# Patient Record
Sex: Female | Born: 1975 | Race: White | Hispanic: No | Marital: Married | Smoking: Former smoker
Health system: Southern US, Community
[De-identification: ages and names within clinical notes are randomized; demographics above are authoritative.]

## PROBLEM LIST (undated history)

## (undated) DIAGNOSIS — M51369 Other intervertebral disc degeneration, lumbar region without mention of lumbar back pain or lower extremity pain: Secondary | ICD-10-CM

## (undated) DIAGNOSIS — R569 Unspecified convulsions: Secondary | ICD-10-CM

## (undated) DIAGNOSIS — F419 Anxiety disorder, unspecified: Secondary | ICD-10-CM

## (undated) DIAGNOSIS — K5792 Diverticulitis of intestine, part unspecified, without perforation or abscess without bleeding: Secondary | ICD-10-CM

## (undated) DIAGNOSIS — M1990 Unspecified osteoarthritis, unspecified site: Secondary | ICD-10-CM

## (undated) DIAGNOSIS — K5790 Diverticulosis of intestine, part unspecified, without perforation or abscess without bleeding: Secondary | ICD-10-CM

## (undated) DIAGNOSIS — R011 Cardiac murmur, unspecified: Secondary | ICD-10-CM

## (undated) DIAGNOSIS — L4050 Arthropathic psoriasis, unspecified: Secondary | ICD-10-CM

## (undated) DIAGNOSIS — M5126 Other intervertebral disc displacement, lumbar region: Secondary | ICD-10-CM

## (undated) DIAGNOSIS — T883XXA Malignant hyperthermia due to anesthesia, initial encounter: Secondary | ICD-10-CM

## (undated) DIAGNOSIS — Z8719 Personal history of other diseases of the digestive system: Secondary | ICD-10-CM

## (undated) DIAGNOSIS — L409 Psoriasis, unspecified: Secondary | ICD-10-CM

## (undated) DIAGNOSIS — N83209 Unspecified ovarian cyst, unspecified side: Secondary | ICD-10-CM

## (undated) DIAGNOSIS — R519 Headache, unspecified: Secondary | ICD-10-CM

## (undated) DIAGNOSIS — E785 Hyperlipidemia, unspecified: Secondary | ICD-10-CM

## (undated) DIAGNOSIS — E079 Disorder of thyroid, unspecified: Secondary | ICD-10-CM

## (undated) DIAGNOSIS — E220 Acromegaly and pituitary gigantism: Secondary | ICD-10-CM

## (undated) DIAGNOSIS — E8801 Alpha-1-antitrypsin deficiency: Secondary | ICD-10-CM

## (undated) DIAGNOSIS — Z8489 Family history of other specified conditions: Secondary | ICD-10-CM

## (undated) DIAGNOSIS — D219 Benign neoplasm of connective and other soft tissue, unspecified: Secondary | ICD-10-CM

## (undated) DIAGNOSIS — E039 Hypothyroidism, unspecified: Secondary | ICD-10-CM

## (undated) DIAGNOSIS — F909 Attention-deficit hyperactivity disorder, unspecified type: Secondary | ICD-10-CM

## (undated) DIAGNOSIS — K219 Gastro-esophageal reflux disease without esophagitis: Secondary | ICD-10-CM

## (undated) DIAGNOSIS — M5136 Other intervertebral disc degeneration, lumbar region: Secondary | ICD-10-CM

## (undated) DIAGNOSIS — J45909 Unspecified asthma, uncomplicated: Secondary | ICD-10-CM

## (undated) DIAGNOSIS — I951 Orthostatic hypotension: Secondary | ICD-10-CM

## (undated) HISTORY — DX: Unspecified convulsions: R56.9

## (undated) HISTORY — DX: Anxiety disorder, unspecified: F41.9

## (undated) HISTORY — DX: Malignant hyperthermia due to anesthesia, initial encounter: T88.3XXA

## (undated) HISTORY — DX: Acromegaly and pituitary gigantism: E22.0

## (undated) HISTORY — DX: Benign neoplasm of connective and other soft tissue, unspecified: D21.9

## (undated) HISTORY — DX: Diverticulitis of intestine, part unspecified, without perforation or abscess without bleeding: K57.92

## (undated) HISTORY — DX: Diverticulosis of intestine, part unspecified, without perforation or abscess without bleeding: K57.90

## (undated) HISTORY — DX: Hypothyroidism, unspecified: E03.9

## (undated) HISTORY — DX: Hyperlipidemia, unspecified: E78.5

## (undated) HISTORY — DX: Other intervertebral disc displacement, lumbar region: M51.26

## (undated) HISTORY — DX: Unspecified osteoarthritis, unspecified site: M19.90

## (undated) HISTORY — DX: Unspecified ovarian cyst, unspecified side: N83.209

## (undated) HISTORY — DX: Gastro-esophageal reflux disease without esophagitis: K21.9

## (undated) HISTORY — DX: Disorder of thyroid, unspecified: E07.9

## (undated) HISTORY — PX: COLONOSCOPY: SHX174

## (undated) HISTORY — PX: DENTAL SURGERY: SHX609

## (undated) HISTORY — DX: Other intervertebral disc degeneration, lumbar region without mention of lumbar back pain or lower extremity pain: M51.369

## (undated) HISTORY — DX: Arthropathic psoriasis, unspecified: L40.50

## (undated) HISTORY — DX: Alpha-1-antitrypsin deficiency: E88.01

## (undated) HISTORY — DX: Psoriasis, unspecified: L40.9

## (undated) HISTORY — DX: Other intervertebral disc degeneration, lumbar region: M51.36

---

## 2011-04-01 HISTORY — PX: LAPAROSCOPIC SIGMOID COLECTOMY: SHX5928

## 2011-06-30 LAB — HM COLONOSCOPY

## 2012-03-31 LAB — HM PAP SMEAR: HM Pap smear: NORMAL

## 2012-10-29 LAB — HM MAMMOGRAPHY: HM MAMMO: NORMAL (ref 0–4)

## 2016-11-26 ENCOUNTER — Encounter: Payer: Self-pay | Admitting: Primary Care

## 2016-11-26 ENCOUNTER — Ambulatory Visit (INDEPENDENT_AMBULATORY_CARE_PROVIDER_SITE_OTHER): Payer: 59 | Admitting: Primary Care

## 2016-11-26 VITALS — BP 118/78 | HR 99 | Temp 98.4°F | Ht 70.0 in | Wt 195.4 lb

## 2016-11-26 DIAGNOSIS — K219 Gastro-esophageal reflux disease without esophagitis: Secondary | ICD-10-CM | POA: Diagnosis not present

## 2016-11-26 DIAGNOSIS — E039 Hypothyroidism, unspecified: Secondary | ICD-10-CM | POA: Insufficient documentation

## 2016-11-26 DIAGNOSIS — E559 Vitamin D deficiency, unspecified: Secondary | ICD-10-CM

## 2016-11-26 DIAGNOSIS — E8801 Alpha-1-antitrypsin deficiency: Secondary | ICD-10-CM | POA: Diagnosis not present

## 2016-11-26 DIAGNOSIS — E538 Deficiency of other specified B group vitamins: Secondary | ICD-10-CM | POA: Diagnosis not present

## 2016-11-26 DIAGNOSIS — E785 Hyperlipidemia, unspecified: Secondary | ICD-10-CM | POA: Diagnosis not present

## 2016-11-26 DIAGNOSIS — L409 Psoriasis, unspecified: Secondary | ICD-10-CM | POA: Insufficient documentation

## 2016-11-26 MED ORDER — MOMETASONE FUROATE 0.1 % EX CREA: 1.0000 "application " | TOPICAL_CREAM | Freq: Every day | CUTANEOUS | 1 refills | Status: DC

## 2016-11-26 NOTE — Patient Instructions (Signed)
Schedule a lab only appointment to return fasting for labs. Ensure you fast 8 hours prior. You may have water.  Please schedule a physical with me at your convenience, we can do the Pap and get your mammogram set up at that time.  It was a pleasure to meet you today! Please don't hesitate to call me with any questions. Welcome to [facility]!

## 2016-11-26 NOTE — Assessment & Plan Note (Signed)
Vitamin D level pending

## 2016-11-26 NOTE — Progress Notes (Signed)
Subjective:    Patient ID: Elaine [name], female    DOB: 1975-07-18, 41 y.o.   MRN: [id number]  HPI  Elaine [name] is a 41 year old female who presents today to establish care and discuss the problems mentioned below. Will obtain old records.  1) Hyperlipidemia: Long history of. Strong family history of this. Never managed on medication in the past. Her last lipid panel was over 1 year ago. She is not fasting today.  2) Alpha anti-1 Trypsin deficiency: Diagnosed several years ago. She's not currently following with anyone at this time. Previously following with GI, pulmonology, Urology in [place], she has not connected with anyone since moving to [place]. She experiences symptoms of shortness of breath, overall manageable. History of recurrent pneumonia in the past. History of colectomy in 2013 due to abdominal sepsis.  3) Hypothyroidism: Diagnosed in 2016. She was originally evaluated by ENT who found a thyroid nodule. The nodule was biopsied which returned benign. Never managed on thyroid replacement medication, always treated with natural supplements. Over the past several months she's gained 20 pounds, experienced increased fatigue, inability to sleep, cold intolerance with bouts of hot flashes, hair loss. Her last thyroid check was over 1 year ago.  4) GERD: Long history of since age 60. Prior endoscopy was in 2012. She will take Prilosec as needed for symptoms, about 2-3 times monthly. She's mostly trying digestive bitters. She watches her diet. No improvement with H2 blockers.  5) Psoriasis: Located to her scalp mostly, right upper extremity, between breasts, eyelids, buttocks, left lateral side. Always suspected to be secondary to alpha anti-1 trypsin. She was previously managed on mometasone with significant improvement.   Review of Systems  Constitutional: Positive for fatigue.  HENT: Negative for congestion.   Respiratory:       Intermittent shortness of breath  Cardiovascular:  Negative for chest pain.  Gastrointestinal:       GERD  Endocrine: Positive for cold intolerance.  Genitourinary:       Menorrhagia  Skin:       Chronic psoriasis  Hematological: Negative for adenopathy.  Psychiatric/Behavioral: Positive for sleep disturbance.        Past Medical History:  Diagnosis Date  . Alpha-1-antitrypsin deficiency ([place])   . Diverticulosis   . GERD (gastroesophageal reflux disease)   . 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 Hyperlipidemia   . Hypothyroidism   . Psoriasis   . Ruptured ovarian cyst 2004,2016     Social History   Social History  . Marital status: Married    Spouse name: N/A  . Number of children: N/A  . Years of education: N/A   Occupational History  . Not on file.   Social History Main Topics  . Smoking status: Former Research scientist (life sciences)  . Smokeless tobacco: Never Used  . Alcohol use Yes  . Drug use: Unknown  . Sexual activity: Not on file   Other Topics Concern  . Not on file   Social History Narrative   Married.   1 child.   Works as a Psychologist, sport and exercise.        Past Surgical History:  Procedure Laterality Date  . COLECTOMY  2013  . DENTAL SURGERY      Family History  Problem Relation Age of Onset  . Stroke Father   . Heart attack Father   . Hyperlipidemia Father     Allergies  Allergen Reactions  . Cephalexin   . Dilaudid  [  Hydromorphone Hcl] Nausea And Vomiting  . Penicillins     No current outpatient prescriptions on file prior to visit.   No current facility-administered medications on file prior to visit.     BP 118/78   Pulse 99   Temp 98.4 F (36.9 C) (Oral)   Ht 5\' 10"  (1.778 m)   Wt 195 lb 6.4 oz (88.6 kg)   LMP 11/12/2016   SpO2 99%   BMI 28.04 kg/m    Objective:   Physical Exam  Constitutional: She is oriented to person, place, and time. She appears well-nourished.  Neck: Neck supple. No thyromegaly present.  Cardiovascular: Normal rate and regular rhythm.   Pulmonary/Chest:  Effort normal and breath sounds normal.  Musculoskeletal: Normal range of motion.  Neurological: She is alert and oriented to person, place, and time.  Skin: Skin is warm and dry.  Psychiatric: She has a normal mood and affect.          Assessment & Plan:

## 2016-11-26 NOTE — Assessment & Plan Note (Signed)
Overall endorses well controlled. Consider sending to pulmonology for further management if symptoms return.

## 2016-11-26 NOTE — Assessment & Plan Note (Signed)
Prescription for mometasone cream sent to pharmacy as this has historically help with symptoms.

## 2016-11-26 NOTE — Assessment & Plan Note (Signed)
Lipid panel pending.

## 2016-11-26 NOTE — Assessment & Plan Note (Signed)
No recent thyroid testing, thyroid panel pending.

## 2016-11-26 NOTE — Assessment & Plan Note (Signed)
Vitamin B12 pending.

## 2016-11-26 NOTE — Assessment & Plan Note (Signed)
Overall stable on Prilosec, using sparingly. Discussed long-term effects of PPIs.

## 2016-11-27 ENCOUNTER — Encounter: Payer: Self-pay | Admitting: Primary Care

## 2016-11-27 ENCOUNTER — Other Ambulatory Visit (INDEPENDENT_AMBULATORY_CARE_PROVIDER_SITE_OTHER): Payer: 59

## 2016-11-27 DIAGNOSIS — E538 Deficiency of other specified B group vitamins: Secondary | ICD-10-CM | POA: Diagnosis not present

## 2016-11-27 DIAGNOSIS — E785 Hyperlipidemia, unspecified: Secondary | ICD-10-CM | POA: Diagnosis not present

## 2016-11-27 DIAGNOSIS — E559 Vitamin D deficiency, unspecified: Secondary | ICD-10-CM | POA: Diagnosis not present

## 2016-11-27 DIAGNOSIS — E039 Hypothyroidism, unspecified: Secondary | ICD-10-CM | POA: Diagnosis not present

## 2016-11-27 LAB — LIPID PANEL
CHOL/HDL RATIO: 4
CHOLESTEROL: 205 mg/dL — AB (ref 0–200)
HDL: 48.6 mg/dL (ref >39.00)
LDL CALC: 131 mg/dL — AB (ref 0–99)
NonHDL: 156.85
TRIGLYCERIDES: 128 mg/dL (ref 0.0–149.0)
VLDL: 25.6 mg/dL (ref 0.0–40.0)

## 2016-11-27 LAB — COMPREHENSIVE METABOLIC PANEL
ALT: 9 U/L (ref 0–35)
AST: 12 U/L (ref 0–37)
Albumin: 4.3 g/dL (ref 3.5–5.2)
Alkaline Phosphatase: 53 U/L (ref 39–117)
BILIRUBIN TOTAL: 0.6 mg/dL (ref 0.2–1.2)
BUN: 14 mg/dL (ref 6–23)
CHLORIDE: 106 meq/L (ref 96–112)
CO2: 27 meq/L (ref 19–32)
Calcium: 9.4 mg/dL (ref 8.4–10.5)
Creatinine, Ser: 0.79 mg/dL (ref 0.40–1.20)
GFR: 85.62 mL/min (ref >60.00)
GLUCOSE: 97 mg/dL (ref 70–99)
Potassium: 4.3 mEq/L (ref 3.5–5.1)
Sodium: 139 mEq/L (ref 135–145)
Total Protein: 6.5 g/dL (ref 6.0–8.3)

## 2016-11-27 LAB — TSH: TSH: 1.96 u[IU]/mL (ref 0.35–4.50)

## 2016-11-27 LAB — VITAMIN D 25 HYDROXY (VIT D DEFICIENCY, FRACTURES): VITD: 57.46 ng/mL (ref 30.00–100.00)

## 2016-11-27 LAB — T3, FREE: T3, Free: 3.5 pg/mL (ref 2.3–4.2)

## 2016-11-27 LAB — T4, FREE: Free T4: 0.71 ng/dL (ref 0.60–1.60)

## 2016-11-27 LAB — VITAMIN B12: Vitamin B-12: 1251 pg/mL — ABNORMAL HIGH (ref 211–911)

## 2016-11-27 NOTE — Addendum Note (Signed)
Addended by: Jacqualin Combes on: 11/27/2016 02:46 PM   Modules accepted: Orders

## 2016-11-28 ENCOUNTER — Telehealth: Payer: Self-pay | Admitting: Primary Care

## 2016-11-28 NOTE — Telephone Encounter (Signed)
Pt returned your call, requesting cb.

## 2016-12-08 ENCOUNTER — Encounter: Payer: 59 | Admitting: Primary Care

## 2016-12-24 ENCOUNTER — Other Ambulatory Visit (HOSPITAL_COMMUNITY)
Admission: RE | Admit: 2016-12-24 | Discharge: 2016-12-24 | Disposition: A | Discharge status: Home / Self Care | Payer: 59 | Source: Ambulatory Visit | Attending: Primary Care | Admitting: Primary Care

## 2016-12-24 ENCOUNTER — Encounter: Payer: Self-pay | Admitting: Primary Care

## 2016-12-24 ENCOUNTER — Ambulatory Visit (INDEPENDENT_AMBULATORY_CARE_PROVIDER_SITE_OTHER): Payer: 59 | Admitting: Primary Care

## 2016-12-24 VITALS — BP 108/78 | HR 76 | Temp 98.4°F | Ht 70.0 in | Wt 188.8 lb

## 2016-12-24 DIAGNOSIS — Z124 Encounter for screening for malignant neoplasm of cervix: Secondary | ICD-10-CM | POA: Diagnosis not present

## 2016-12-24 DIAGNOSIS — Z1231 Encounter for screening mammogram for malignant neoplasm of breast: Secondary | ICD-10-CM | POA: Diagnosis not present

## 2016-12-24 DIAGNOSIS — K5790 Diverticulosis of intestine, part unspecified, without perforation or abscess without bleeding: Secondary | ICD-10-CM | POA: Insufficient documentation

## 2016-12-24 DIAGNOSIS — E538 Deficiency of other specified B group vitamins: Secondary | ICD-10-CM

## 2016-12-24 DIAGNOSIS — Z87891 Personal history of nicotine dependence: Secondary | ICD-10-CM | POA: Insufficient documentation

## 2016-12-24 DIAGNOSIS — L409 Psoriasis, unspecified: Secondary | ICD-10-CM | POA: Diagnosis not present

## 2016-12-24 DIAGNOSIS — Z0000 Encounter for general adult medical examination without abnormal findings: Secondary | ICD-10-CM

## 2016-12-24 DIAGNOSIS — E785 Hyperlipidemia, unspecified: Secondary | ICD-10-CM

## 2016-12-24 DIAGNOSIS — Z0001 Encounter for general adult medical examination with abnormal findings: Secondary | ICD-10-CM

## 2016-12-24 DIAGNOSIS — E559 Vitamin D deficiency, unspecified: Secondary | ICD-10-CM | POA: Diagnosis not present

## 2016-12-24 DIAGNOSIS — E039 Hypothyroidism, unspecified: Secondary | ICD-10-CM

## 2016-12-24 DIAGNOSIS — K219 Gastro-esophageal reflux disease without esophagitis: Secondary | ICD-10-CM | POA: Insufficient documentation

## 2016-12-24 DIAGNOSIS — Z1239 Encounter for other screening for malignant neoplasm of breast: Secondary | ICD-10-CM

## 2016-12-24 DIAGNOSIS — L237 Allergic contact dermatitis due to plants, except food: Secondary | ICD-10-CM

## 2016-12-24 DIAGNOSIS — R569 Unspecified convulsions: Secondary | ICD-10-CM | POA: Insufficient documentation

## 2016-12-24 DIAGNOSIS — Z1211 Encounter for screening for malignant neoplasm of colon: Secondary | ICD-10-CM | POA: Insufficient documentation

## 2016-12-24 MED ORDER — TRIAMCINOLONE ACETONIDE 0.1 % EX CREA: 1.0000 "application " | TOPICAL_CREAM | Freq: Two times a day (BID) | CUTANEOUS | 0 refills | Status: DC

## 2016-12-24 NOTE — Progress Notes (Signed)
Subjective:    Patient ID: Elaine [name], female    DOB: 1975-11-26, 41 y.o.   MRN: [id number]  HPI  Elaine [name] is a 41 year old female who presents today for complete physical.  Immunizations: -Tetanus: Completed in 2015 -Influenza: Completed in late August 2018   Diet: She endorses a healthy diet.  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 Eye exam: Completed in November 2017 Dental exam: Completes annually Pap Smear: Completed in 2014.  Mammogram: Completed in 2014, due.    Review of Systems  Constitutional: Negative for unexpected weight change.  HENT: Negative for rhinorrhea.   Respiratory: Negative for cough and shortness of breath.   Cardiovascular: Negative for chest pain.  Gastrointestinal: Negative for constipation and diarrhea.  Genitourinary: Positive for menstrual problem. Negative for difficulty urinating.       Menstrual cycles more frequent, twice monthly with a 2 day cessation of menses. Present for the past 2 months.   Musculoskeletal: Negative for arthralgias and myalgias.  Skin: Positive for rash.       Encountered poison ivy  Allergic/Immunologic: Negative for environmental allergies.  Neurological: Negative for dizziness, numbness and headaches.  Psychiatric/Behavioral:       Increased stress, manages well.        Past Medical History:  Diagnosis Date  . Alpha-1-antitrypsin deficiency ([place])   . Diverticulitis    with sepsis  . Diverticulosis   . GERD (gastroesophageal reflux disease)   . 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 Hyperlipidemia   . Hypothyroidism   . Psoriasis   . Ruptured ovarian cyst 2004,2016  . Seizures [facility])      Social History   Social History  . Marital status: Married    Spouse name: N/A  . Number of children: N/A  .  Years of education: N/A   Occupational History  . Not on file.   Social History Main Topics  . Smoking status: Former Research scientist (life sciences)  . Smokeless tobacco: Never Used  . Alcohol use Yes  . Drug use: Unknown  . Sexual activity: Not on file   Other Topics Concern  . Not on file   Social History Narrative   Married.   1 child.   Works as a Psychologist, sport and exercise.        Past Surgical History:  Procedure Laterality Date  . DENTAL SURGERY    . LAPAROSCOPIC SIGMOID COLECTOMY  2013    Family History  Problem Relation Age of Onset  . Stroke Father   . Heart attack Father   . Hyperlipidemia Father     Allergies  Allergen Reactions  . Cephalexin   . Dilaudid  [Hydromorphone Hcl] Nausea And Vomiting  . Penicillins     Current Outpatient Prescriptions on File Prior to Visit  Medication Sig Dispense Refill  . 5-Hydroxytryptophan (5-HTP PO) Take 200 mg by mouth daily.    . INOSITOL PO Take 500 mg by mouth daily.    Marland Kitchen KRILL OIL PO Take 1,000 mg by mouth daily.    Marland Kitchen MAGNESIUM MALATE PO Take 625 mg by mouth daily.    . Melatonin 5 MG CAPS Take 5 mg by mouth at bedtime.    . mometasone (ELOCON) 0.1 % cream Apply 1 application topically daily. 45 g 1   No current facility-administered medications on  file prior to visit.     BP 108/78   Pulse 76   Temp 98.4 F (36.9 C) (Oral)   Ht 5\' 10"  (1.778 m)   Wt 188 lb 12.8 oz (85.6 kg)   LMP 12/06/2016   SpO2 98%   BMI 27.09 kg/m    Objective:   Physical Exam  Constitutional: She is oriented to person, place, and time. She appears well-nourished.  HENT:  Right Ear: Tympanic membrane and ear canal normal.  Left Ear: Tympanic membrane and ear canal normal.  Nose: Nose normal.  Mouth/Throat: Oropharynx is clear and moist.  Eyes: Pupils are equal, round, and reactive to light. Conjunctivae and EOM are normal.  Neck: Neck supple. No thyromegaly present.  Cardiovascular: Normal rate and regular rhythm.   No murmur heard. Pulmonary/Chest: Effort  normal and breath sounds normal. She has no rales.  Abdominal: Soft. Bowel sounds are normal. There is no tenderness.  Musculoskeletal: Normal range of motion.  Lymphadenopathy:    She has no cervical adenopathy.  Neurological: She is alert and oriented to person, place, and time. She has normal reflexes. No cranial nerve deficit.  Skin: Skin is warm and dry. Rash noted.  Mild rash to bilateral upper extremities representative of contact dermatitis from poison ivy  Psychiatric: She has a normal mood and affect.          Assessment & Plan:

## 2016-12-24 NOTE — Assessment & Plan Note (Signed)
Immunizations UTD. Pap due, completed today. Mammogram due, ordered today. Continue regular exercise and improvements in diet. Exam unremarkable except for poison ivy dermatitis. Treated with triamcinolone cream BID. Labs overall stable. Repeat Vitamin D and B 12 in 3 months. Follow up in 1 year.

## 2016-12-24 NOTE — Assessment & Plan Note (Signed)
Stopped taking vitamin D several weeks ago, recent vitamin D level stable. Continue to monitor, repeat in 3 months.

## 2016-12-24 NOTE — Patient Instructions (Signed)
Apply the triamcinolone cream twice daily to the rash. Do not apply this to your face.  Continue exercising. You should be getting 150 minutes of moderate intensity exercise weekly.  Increase vegetables, fruit, whole grains.  Ensure you are consuming 64 ounces of water daily.  Schedule a lab only appointment in 3 months to recheck Vitamin B 12 and Vitamin D.  Follow up in 1 year for your annual exam or sooner if needed.  It was a pleasure to see you today!

## 2016-12-24 NOTE — Assessment & Plan Note (Signed)
TSH, Free T4, Free T3 unremarkable.

## 2016-12-24 NOTE — Assessment & Plan Note (Signed)
Stopped taking vitamin B 12 several weeks ago, recent vitamin D level stable. Continue to monitor, repeat in 3 months.

## 2016-12-24 NOTE — Assessment & Plan Note (Signed)
Slightly above goal for TC and LDL, likely genetic influence given her active lifestyle and healthy diet. Continue to monitor.

## 2016-12-24 NOTE — Addendum Note (Signed)
Addended by: Jacqualin Combes on: 12/24/2016 01:00 PM   Modules accepted: Orders

## 2016-12-25 LAB — CYTOLOGY - PAP
Diagnosis: NEGATIVE
HPV (WINDOPATH): NOT DETECTED

## 2016-12-26 ENCOUNTER — Encounter: Payer: Self-pay | Admitting: *Deleted

## 2017-01-01 ENCOUNTER — Ambulatory Visit
Admission: RE | Admit: 2017-01-01 | Discharge: 2017-01-01 | Disposition: A | Discharge status: Home / Self Care | Payer: 59 | Source: Ambulatory Visit | Attending: Primary Care | Admitting: Primary Care

## 2017-01-01 DIAGNOSIS — Z1239 Encounter for other screening for malignant neoplasm of breast: Secondary | ICD-10-CM

## 2017-02-05 ENCOUNTER — Ambulatory Visit (INDEPENDENT_AMBULATORY_CARE_PROVIDER_SITE_OTHER)
Admission: RE | Admit: 2017-02-05 | Discharge: 2017-02-05 | Disposition: A | Discharge status: Home / Self Care | Payer: 59 | Source: Ambulatory Visit | Attending: Pulmonary Disease | Admitting: Pulmonary Disease

## 2017-02-05 ENCOUNTER — Ambulatory Visit: Payer: 59 | Admitting: Pulmonary Disease

## 2017-02-05 ENCOUNTER — Other Ambulatory Visit: Payer: 59

## 2017-02-05 ENCOUNTER — Encounter: Payer: Self-pay | Admitting: Pulmonary Disease

## 2017-02-05 ENCOUNTER — Telehealth: Payer: Self-pay | Admitting: *Deleted

## 2017-02-05 VITALS — BP 118/82 | HR 77 | Ht 70.0 in | Wt 188.6 lb

## 2017-02-05 DIAGNOSIS — E8801 Alpha-1-antitrypsin deficiency: Secondary | ICD-10-CM

## 2017-02-05 DIAGNOSIS — Z8639 Personal history of other endocrine, nutritional and metabolic disease: Secondary | ICD-10-CM

## 2017-02-05 NOTE — Assessment & Plan Note (Signed)
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  And as such should not have risk of emphysema.  Her asthma seems to be well controlled and she certainly does not need a maintenance inhaler since she does not have persistent symptoms

## 2017-02-05 NOTE — Progress Notes (Signed)
Subjective:    Patient ID: Elaine [name], female    DOB: Jul 12, 1975, 41 y.o.   MRN: [id number]  HPI  41 year old woman referred for evaluation for alpha-1 antitrypsin deficiency. She was diagnosed in [place] in 2007 and reports that she has the MZ phenotype ,her alpha-1 level by 10 was 69 with 13.1 functional level.  She reports this was performed because she was having repeated lung infections around that time and she found out that her mother was a carrier  She reports a diagnosis of asthma and was maintained on Advair for many years, but has not required this for the last 5 years.  She reports that her problems with infections and asthma seem to subside about 2014 when she underwent a partial sigmoid colectomy for diverticulitis.  She also reports a diagnosis of scoliosis and pectus excavatum  After moving to [facility], no symptoms of shortness of breath and lung infections are subsided.  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  She lives with her husband and 68 year old child and takes care of her in-laws. She smoked as a teenager and quit in 1999.  She drinks alcohol socially. She denies any problems with her liver digestive system  Of note, LFTs 10/2016 was normal    family history -mother is a carrier (MZ) , father was a 2 pack/day smoker and had COPD and early strokes and MI, older sister has asthma and will not reveal her testing results, and her brother has not been tested.  Uncle had a heart and lung transplant in the 80s and was a carrier, uncles grandson is an MZ     Past Medical History:  Diagnosis Date  . Alpha-1-antitrypsin deficiency ([place])   . Diverticulitis    with sepsis  . Diverticulosis   . GERD (gastroesophageal reflux disease)   . 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 Hyperlipidemia   . Hypothyroidism   . Psoriasis   . Ruptured ovarian cyst 2004,2016  . Seizures ([place])     Past Surgical History:  Procedure Laterality Date   . DENTAL SURGERY    . LAPAROSCOPIC SIGMOID COLECTOMY  2013     Allergies  Allergen Reactions  . Cephalexin   . Dilaudid  [Hydromorphone Hcl] Nausea And Vomiting  . Penicillins     Social History   Socioeconomic History  . Marital status: Married    Spouse name: Not on file  . Number of children: Not on file  . Years of education: Not on file  . Highest education level: Not on file  Social Needs  . Financial resource strain: Not on file  . Food insecurity - worry: Not on file  . Food insecurity - inability: Not on file  . Transportation needs - medical: Not on file  . Transportation needs - non-medical: Not on file  Occupational History  . Not on file  Tobacco Use  . Smoking status: Former Research scientist (life sciences)  . Smokeless tobacco: Never Used  Substance and Sexual Activity  . Alcohol use: Yes  . Drug use: Not on file  . Sexual activity: Not on file  Other Topics Concern  . Not on file  Social History Narrative   Married.   1 child.   Works as a Psychologist, sport and exercise.       Family History  Problem Relation Age of Onset  . Stroke Father   . Heart attack Father   . Hyperlipidemia Father  Review of Systems Constitutional: negative for anorexia, fevers and sweats  Eyes: negative for irritation, redness and visual disturbance  Ears, nose, mouth, throat, and face: negative for earaches, epistaxis, nasal congestion and sore throat  Respiratory: negative for cough, dyspnea on exertion, sputum and wheezing  Cardiovascular: negative for chest pain, dyspnea, lower extremity edema, orthopnea, palpitations and syncope  Gastrointestinal: negative for abdominal pain, constipation, diarrhea, melena, nausea and vomiting  Genitourinary:negative for dysuria, frequency and hematuria  Hematologic/lymphatic: negative for bleeding, easy bruising and lymphadenopathy  Musculoskeletal:negative for arthralgias, muscle weakness and stiff joints  Neurological: negative for coordination problems, gait  problems, headaches and weakness  Endocrine: negative for diabetic symptoms including polydipsia, polyuria and weight loss      Objective:   Physical Exam  Gen. Pleasant, well-nourished, in no distress, normal affect ENT - no lesions, no post nasal drip Neck: No JVD, no thyromegaly, no carotid bruits Lungs: no use of accessory muscles, no dullness to percussion, clear without rales or rhonchi  Cardiovascular: Rhythm regular, heart sounds  normal, no murmurs or gallops, no peripheral edema Abdomen: soft and non-tender, no hepatosplenomegaly, BS normal. Musculoskeletal: No deformities, no cyanosis or clubbing Neuro:  alert, non focal       Assessment & Plan:

## 2017-02-05 NOTE — Telephone Encounter (Signed)
Copied from [place] [id number]. Topic: General - Other >> Feb 05, 2017  3:33 PM Patrice Paradise wrote: Reason for CRM: Patient would like for Allie Bossier to give her a call. When to her appt today at [facility] and would like to discuss her results with Anda Kraft.

## 2017-02-05 NOTE — Patient Instructions (Signed)
CXR today & schedule PFTs Check alpha 1 levels & genotype

## 2017-02-05 NOTE — Telephone Encounter (Signed)
Pt wants to discuss lab results from pulmonology she is aware Anda Kraft will not be back until next week so she wants to wait until Anda Kraft is back next week and speak with her

## 2017-02-06 ENCOUNTER — Ambulatory Visit (INDEPENDENT_AMBULATORY_CARE_PROVIDER_SITE_OTHER): Payer: 59 | Admitting: Pulmonary Disease

## 2017-02-06 DIAGNOSIS — Z8639 Personal history of other endocrine, nutritional and metabolic disease: Secondary | ICD-10-CM | POA: Diagnosis not present

## 2017-02-06 LAB — PULMONARY FUNCTION TEST
DL/VA % pred: 80 %
DL/VA: 4.41 ml/min/mmHg/L
DLCO COR: 22.27 ml/min/mmHg
DLCO UNC % PRED: 73 %
DLCO cor % pred: 68 %
DLCO unc: 23.87 ml/min/mmHg
FEF 25-75 PRE: 2.32 L/s
FEF 25-75 Post: 2.88 L/sec
FEF2575-%Change-Post: 23 %
FEF2575-%PRED-PRE: 66 %
FEF2575-%Pred-Post: 82 %
FEV1-%Change-Post: 5 %
FEV1-%PRED-PRE: 75 %
FEV1-%Pred-Post: 80 %
FEV1-POST: 2.91 L
FEV1-PRE: 2.75 L
FEV1FVC-%Change-Post: 4 %
FEV1FVC-%Pred-Pre: 94 %
FEV6-%CHANGE-POST: 0 %
FEV6-%PRED-POST: 81 %
FEV6-%PRED-PRE: 81 %
FEV6-POST: 3.6 L
FEV6-PRE: 3.57 L
FEV6FVC-%PRED-PRE: 102 %
FEV6FVC-%Pred-Post: 102 %
FVC-%Change-Post: 0 %
FVC-%PRED-POST: 80 %
FVC-%Pred-Pre: 79 %
FVC-Post: 3.6 L
FVC-Pre: 3.57 L
POST FEV6/FVC RATIO: 100 %
Post FEV1/FVC ratio: 81 %
Pre FEV1/FVC ratio: 77 %
Pre FEV6/FVC Ratio: 100 %
RV % pred: 76 %
RV: 1.45 L
TLC % pred: 88 %
TLC: 5.32 L

## 2017-02-06 NOTE — Progress Notes (Signed)
PFT done today. 

## 2017-02-09 LAB — ALPHA-1 ANTITRYPSIN PHENOTYPE: A1 ANTITRYPSIN SER: 95 mg/dL (ref 83–199)

## 2017-02-09 NOTE — Telephone Encounter (Signed)
Spoke with patient regarding results

## 2017-03-02 ENCOUNTER — Ambulatory Visit: Payer: 59 | Admitting: Family Medicine

## 2017-03-02 ENCOUNTER — Encounter: Payer: Self-pay | Admitting: Family Medicine

## 2017-03-02 VITALS — BP 124/76 | HR 78 | Temp 98.5°F | Ht 70.0 in | Wt 195.5 lb

## 2017-03-02 DIAGNOSIS — J019 Acute sinusitis, unspecified: Secondary | ICD-10-CM | POA: Insufficient documentation

## 2017-03-02 DIAGNOSIS — J0100 Acute maxillary sinusitis, unspecified: Secondary | ICD-10-CM

## 2017-03-02 MED ORDER — AZITHROMYCIN 250 MG PO TABS: ORAL_TABLET | ORAL | 0 refills | Status: DC

## 2017-03-02 NOTE — Assessment & Plan Note (Signed)
S/p uri with focal facial pain  Disc symptomatic care - see instructions on AVS  Px zpak-pt is pcn all and req this/works well in the past Recommend expectorant prn  Update if not starting to improve in a week or if worsening

## 2017-03-02 NOTE — Patient Instructions (Signed)
Take the zpack as directed for sinus infection  Drink fluids/rest/breathe steam and use warm compresses Nasal saline is good  mucinex may help  Update if not starting to improve in a week or if worsening

## 2017-03-02 NOTE — Progress Notes (Signed)
Subjective:    Patient ID: Elaine [name], female    DOB: 25-Mar-1976, 41 y.o.   MRN: [id number]  HPI Here for sinus symptoms   41 yo pt of NP [place]  A few wk ago she noted sinus drainage Then throat was sore - low in the throat from drainage Then lost voice  Her R cheek hurts- feels swollen with lots of pressure (worse since Thursday)  R ear feels full  Hurts to lean forward   All mucous is draining back instead of forward (some dark yellow)  Was sneezing for a while   Has not taken temp but felt feverish over the weekend / had to nap on Saturday  Tylenol severe sinus helps short term  Using nasal saline  Air saline gel as well   Patient Active Problem List   Diagnosis Date Noted  . Acute sinusitis 03/02/2017  . Preventative health care 12/24/2016  . Hypothyroidism 11/26/2016  . Hyperlipidemia 11/26/2016  . Vitamin B 12 deficiency 11/26/2016  . Vitamin D deficiency 11/26/2016  . Psoriasis 11/26/2016  . Alpha-1-antitrypsin deficiency ([place]) 11/26/2016  . GERD (gastroesophageal reflux disease) 11/26/2016   Past Medical History:  Diagnosis Date  . Alpha-1-antitrypsin deficiency ([place])   . Diverticulitis    with sepsis  . Diverticulosis   . GERD (gastroesophageal reflux disease)   . 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 Hyperlipidemia   . Hypothyroidism   . Psoriasis   . Ruptured ovarian cyst 2004,2016  . Seizures ([place])    Past Surgical History:  Procedure Laterality Date  . DENTAL SURGERY    . LAPAROSCOPIC SIGMOID COLECTOMY  2013   Social History   Tobacco Use  . Smoking status: Former Research scientist (life sciences)  . Smokeless tobacco: Never Used  Substance Use Topics  . Alcohol use: Yes  . Drug use: Not on file   Family History  Problem Relation Age of Onset  . Stroke Father   . Heart attack Father   . Hyperlipidemia Father    Allergies  Allergen Reactions  . Cephalexin   . Dilaudid  [Hydromorphone Hcl] Nausea And Vomiting  . Penicillins    Current  Outpatient Medications on File Prior to Visit  Medication Sig Dispense Refill  . 5-Hydroxytryptophan (5-HTP PO) Take 200 mg by mouth daily.    . INOSITOL PO Take 500 mg by mouth daily.    Marland Kitchen KRILL OIL PO Take 1,000 mg by mouth daily.    Marland Kitchen linaclotide (LINZESS) 145 MCG CAPS capsule Take 145 mcg by mouth daily before breakfast.    . MAGNESIUM MALATE PO Take 625 mg by mouth daily.    . Melatonin 5 MG CAPS Take 5 mg by mouth at bedtime.    . mometasone (ELOCON) 0.1 % cream Apply 1 application topically daily. 45 g 1  . triamcinolone cream (KENALOG) 0.1 % Apply 1 application topically 2 (two) times daily. 30 g 0   No current facility-administered medications on file prior to visit.     Review of Systems  Constitutional: Positive for appetite change. Negative for fatigue and fever.  HENT: Positive for congestion, ear pain, postnasal drip, rhinorrhea, sinus pressure and sore throat. Negative for nosebleeds.   Eyes: Negative for pain, redness and itching.  Respiratory: Positive for cough. Negative for shortness of breath and wheezing.   Cardiovascular: Negative for chest pain.  Gastrointestinal: Negative for abdominal pain, diarrhea, nausea and vomiting.  Endocrine: Negative for polyuria.  Genitourinary: Negative for dysuria,  frequency and urgency.  Musculoskeletal: Negative for arthralgias and myalgias.  Allergic/Immunologic: Negative for immunocompromised state.  Neurological: Positive for headaches. Negative for dizziness, tremors, syncope, weakness and numbness.  Hematological: Negative for adenopathy. Does not bruise/bleed easily.  Psychiatric/Behavioral: Negative for dysphoric mood. The patient is not nervous/anxious.        Objective:   Physical Exam  Constitutional: She appears well-developed and well-nourished. No distress.  HENT:  Head: Normocephalic and atraumatic.  Right Ear: External ear normal.  Left Ear: External ear normal.  Mouth/Throat: Oropharynx is clear and moist. No  oropharyngeal exudate.  Nares are injected and congested  R frontal and maxillary sinus tenderness  Post nasal drip    Eyes: Conjunctivae and EOM are normal. Pupils are equal, round, and reactive to light. Right eye exhibits no discharge. Left eye exhibits no discharge.  Neck: Normal range of motion. Neck supple.  Cardiovascular: Normal rate and regular rhythm.  Pulmonary/Chest: Effort normal and breath sounds normal. No respiratory distress. She has no wheezes. She has no rales.  Lymphadenopathy:    She has no cervical adenopathy.  Neurological: She is alert. No cranial nerve deficit.  Skin: Skin is warm and dry. No rash noted.  Psychiatric: She has a normal mood and affect.          Assessment & Plan:   Problem List Items Addressed This Visit      Respiratory   Acute sinusitis    S/p uri with focal facial pain  Disc symptomatic care - see instructions on AVS  Px zpak-pt is pcn all and req this/works well in the past Recommend expectorant prn  Update if not starting to improve in a week or if worsening        Relevant Medications   azithromycin (ZITHROMAX Z-PAK) 250 MG tablet

## 2017-03-18 ENCOUNTER — Other Ambulatory Visit: Payer: Self-pay | Admitting: Primary Care

## 2017-03-18 DIAGNOSIS — E538 Deficiency of other specified B group vitamins: Secondary | ICD-10-CM

## 2017-03-18 DIAGNOSIS — E559 Vitamin D deficiency, unspecified: Secondary | ICD-10-CM

## 2017-03-25 ENCOUNTER — Other Ambulatory Visit: Payer: Self-pay | Admitting: Primary Care

## 2017-03-25 ENCOUNTER — Other Ambulatory Visit (INDEPENDENT_AMBULATORY_CARE_PROVIDER_SITE_OTHER): Payer: 59

## 2017-03-25 DIAGNOSIS — E559 Vitamin D deficiency, unspecified: Secondary | ICD-10-CM | POA: Diagnosis not present

## 2017-03-25 DIAGNOSIS — E538 Deficiency of other specified B group vitamins: Secondary | ICD-10-CM

## 2017-03-25 LAB — VITAMIN B12: VITAMIN B 12: 696 pg/mL (ref 211–911)

## 2017-03-25 LAB — VITAMIN D 25 HYDROXY (VIT D DEFICIENCY, FRACTURES): VITD: 38.5 ng/mL (ref 30.00–100.00)

## 2017-06-01 ENCOUNTER — Ambulatory Visit: Payer: 59 | Admitting: Family Medicine

## 2017-06-01 ENCOUNTER — Encounter: Payer: Self-pay | Admitting: Family Medicine

## 2017-06-01 VITALS — BP 110/70 | HR 89 | Temp 98.1°F | Wt 195.2 lb

## 2017-06-01 DIAGNOSIS — R221 Localized swelling, mass and lump, neck: Secondary | ICD-10-CM

## 2017-06-01 DIAGNOSIS — J029 Acute pharyngitis, unspecified: Secondary | ICD-10-CM | POA: Diagnosis not present

## 2017-06-01 DIAGNOSIS — J069 Acute upper respiratory infection, unspecified: Secondary | ICD-10-CM | POA: Diagnosis not present

## 2017-06-01 DIAGNOSIS — B9789 Other viral agents as the cause of diseases classified elsewhere: Secondary | ICD-10-CM

## 2017-06-01 HISTORY — DX: Localized swelling, mass and lump, neck: R22.1

## 2017-06-01 LAB — POCT RAPID STREP A (OFFICE): Rapid Strep A Screen: NEGATIVE

## 2017-06-01 NOTE — Patient Instructions (Signed)
Can take ibuprofen 2 tablets every 8-12 hours for throat pain  Drink enough fluids to make your urine light yellow.  Please come back in if you are not better in 5-7 days or if you develop wheezing, shortness of breath or persistent vomiting.

## 2017-06-01 NOTE — Progress Notes (Signed)
Subjective:    Patient ID: Elaine [name], female    DOB: 26-Feb-1976, 42 y.o.   MRN: [id number]  HPI This is a 42 yo female who presents today with sore throat x 1 week. Noticed some white spots on throat. No fever. Dry cough x 2 days. No muscle aches, yesterday had migraine. Ears itch. In and out of doctor's offices several times a week with her in laws. No wheeze or change in SOB. Taking Nyquil with some relief. Feels better this morning and no longer sees spots on her throat.   Past Medical History:  Diagnosis Date  . Alpha-1-antitrypsin deficiency ([place])   . Diverticulitis    with sepsis  . Diverticulosis   . GERD (gastroesophageal reflux disease)   . 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 Hyperlipidemia   . Hypothyroidism   . Psoriasis   . Ruptured ovarian cyst 2004,2016  . Seizures ([place])    Past Surgical History:  Procedure Laterality Date  . DENTAL SURGERY    . LAPAROSCOPIC SIGMOID COLECTOMY  2013   Family History  Problem Relation Age of Onset  . Stroke Father   . Heart attack Father   . Hyperlipidemia Father    Social History   Tobacco Use  . Smoking status: Former Research scientist (life sciences)  . Smokeless tobacco: Never Used  Substance Use Topics  . Alcohol use: Yes  . Drug use: Not on file      Review of Systems Per HPI    Objective:   Physical Exam  Constitutional: She is oriented to person, place, and time. She appears well-developed and well-nourished. No distress.  HENT:  Head: Normocephalic and atraumatic.  Right Ear: Tympanic membrane, external ear and ear canal normal.  Left Ear: Tympanic membrane, external ear and ear canal normal.  Nose: No mucosal edema or rhinorrhea.  Mouth/Throat: Uvula is midline and mucous membranes are normal. No oropharyngeal exudate, posterior oropharyngeal edema or posterior oropharyngeal erythema.  Small nares. Post nasal drainage.   Eyes: Conjunctivae are normal.  Neck: Normal range of motion. Neck supple.    Cardiovascular: Normal rate, regular rhythm and normal heart sounds.  Pulmonary/Chest: Effort normal and breath sounds normal.  Lymphadenopathy:    She has no cervical adenopathy.  Neurological: She is alert and oriented to person, place, and time.  Skin: Skin is warm and dry. She is not diaphoretic.  Psychiatric: She has a normal mood and affect. Her behavior is normal. Judgment and thought content normal.  Vitals reviewed.     BP 110/70   Pulse 89   Temp 98.1 F (36.7 C) (Oral)   Wt 195 lb 4 oz (88.6 kg)   LMP 05/24/2017   SpO2 100%   BMI 28.02 kg/m  Wt Readings from Last 3 Encounters:  06/01/17 195 lb 4 oz (88.6 kg)  03/02/17 195 lb 8 oz (88.7 kg)  02/05/17 188 lb 9.6 oz (85.5 kg)   Results for orders placed or performed in visit on 06/01/17  Rapid Strep A  Result Value Ref Range   Rapid Strep A Screen Negative Negative       Assessment & Plan:  1. Sore throat - Rapid Strep A  2. Viral URI with cough - Provided written and verbal information regarding diagnosis and treatment. - continue symptomatic relief measures - RTC precautions reviewed- purulent drainage, increased temperature, increased SOB or wheeze   Clarene Reamer, FNP-BC  [facility], [place]  06/01/2017 10:36 AM

## 2017-06-22 ENCOUNTER — Other Ambulatory Visit (INDEPENDENT_AMBULATORY_CARE_PROVIDER_SITE_OTHER): Payer: 59

## 2017-06-22 DIAGNOSIS — E538 Deficiency of other specified B group vitamins: Secondary | ICD-10-CM

## 2017-06-22 DIAGNOSIS — E559 Vitamin D deficiency, unspecified: Secondary | ICD-10-CM

## 2017-06-22 LAB — VITAMIN B12: VITAMIN B 12: 945 pg/mL — AB (ref 211–911)

## 2017-06-22 LAB — VITAMIN D 25 HYDROXY (VIT D DEFICIENCY, FRACTURES): VITD: 33.54 ng/mL (ref 30.00–100.00)

## 2017-06-23 ENCOUNTER — Other Ambulatory Visit: Payer: 59

## 2017-06-23 ENCOUNTER — Encounter: Payer: Self-pay | Admitting: *Deleted

## 2017-07-15 ENCOUNTER — Ambulatory Visit: Payer: 59 | Admitting: Psychology

## 2017-07-15 DIAGNOSIS — F4310 Post-traumatic stress disorder, unspecified: Secondary | ICD-10-CM

## 2017-07-22 ENCOUNTER — Ambulatory Visit: Payer: 59 | Admitting: Psychology

## 2017-07-22 DIAGNOSIS — F4310 Post-traumatic stress disorder, unspecified: Secondary | ICD-10-CM | POA: Diagnosis not present

## 2017-08-05 ENCOUNTER — Ambulatory Visit: Payer: 59 | Admitting: Psychology

## 2017-08-05 DIAGNOSIS — F4310 Post-traumatic stress disorder, unspecified: Secondary | ICD-10-CM

## 2017-08-12 ENCOUNTER — Encounter: Payer: Self-pay | Admitting: Primary Care

## 2017-08-12 ENCOUNTER — Ambulatory Visit (INDEPENDENT_AMBULATORY_CARE_PROVIDER_SITE_OTHER)
Admission: RE | Admit: 2017-08-12 | Discharge: 2017-08-12 | Disposition: A | Discharge status: Home / Self Care | Payer: 59 | Source: Ambulatory Visit | Attending: Primary Care | Admitting: Primary Care

## 2017-08-12 ENCOUNTER — Ambulatory Visit: Payer: 59 | Admitting: Primary Care

## 2017-08-12 VITALS — BP 122/82 | HR 75 | Temp 98.1°F | Ht 70.0 in | Wt 194.2 lb

## 2017-08-12 DIAGNOSIS — G8929 Other chronic pain: Secondary | ICD-10-CM | POA: Insufficient documentation

## 2017-08-12 DIAGNOSIS — M25531 Pain in right wrist: Secondary | ICD-10-CM | POA: Diagnosis not present

## 2017-08-12 MED ORDER — DICLOFENAC SODIUM 75 MG PO TBEC: 75.0000 mg | DELAYED_RELEASE_TABLET | Freq: Two times a day (BID) | ORAL | 0 refills | Status: DC

## 2017-08-12 NOTE — Patient Instructions (Signed)
Try taking diclofenac tablets for pain and inflammation. Take 1 tablet twice daily with food for one week at least.   Purchase a brace to support the wrist and forearm as discussed.  Please update me in 2 weeks, if no improvement then please schedule a visit with Dr. Lorelei Pont.   It was a pleasure to see you today!

## 2017-08-12 NOTE — Progress Notes (Signed)
Subjective:    Patient ID: Elaine [name], female    DOB: 19-Oct-1975, 42 y.o.   MRN: [id number]  HPI  [name] is a 42 year old female who presents today with a chief complaint of wrist pain.  Her pain is located to the right dorsal wrist with radiation of pain to her forearm, this has been present for nearly one year. She tripped over a baby gate 9 months ago, noticed bruising and swelling initially but these symptoms resolved a few days later.   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 She works with her hands very often. Her symptoms have progressed and she now notices her symptoms when doing activites that are normal for her such as pottery, cooking, gardening, doing dishes. She can no longer lift her cast iron pots with her right hand. She has noticed numbness to the dorsal wrist intermittently beginning years ago.   She's tried applying OTC "pain cream" and ice without much improvement. She doesn't take NSAID's as they cause GI upset.   Review of Systems  Musculoskeletal: Positive for arthralgias, joint swelling and myalgias.  Skin: Negative for color change.       Past Medical History:  Diagnosis Date  . Alpha-1-antitrypsin deficiency ([name])   . Diverticulitis    with sepsis  . Diverticulosis   . GERD (gastroesophageal reflux disease)   . 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 Hyperlipidemia   . Hypothyroidism   . Psoriasis   . Ruptured ovarian cyst 2004,2016  . Seizures ([name])      Social History   Socioeconomic History  . Marital status: Married    Spouse name: Not on file  . Number of children: Not on file  . Years of education: Not on file  . Highest education level: Not on file  Occupational History  . Not on file  Social Needs  . Financial resource strain: Not on file  . Food insecurity:    Worry: Not on file    Inability: Not on file  . Transportation needs:    Medical: Not on file    Non-medical: Not on file  Tobacco Use  . Smoking  status: Former Research scientist (life sciences)  . Smokeless tobacco: Never Used  Substance and Sexual Activity  . Alcohol use: Yes  . Drug use: Not on file  . Sexual activity: Not on file  Lifestyle  . Physical activity:    Days per week: Not on file    Minutes per session: Not on file  . Stress: Not on file  Relationships  . Social connections:    Talks on phone: Not on file    Gets together: Not on file    Attends religious service: Not on file    Active member of club or organization: Not on file    Attends meetings of clubs or organizations: Not on file    Relationship status: Not on file  . Intimate partner violence:    Fear of current or ex partner: Not on file    Emotionally abused: Not on file    Physically abused: Not on file    Forced sexual activity: Not on file  Other Topics Concern  . Not on file  Social History Narrative   Married.   1 child.   Works as a Psychologist, sport and exercise.     Past Surgical History:  Procedure Laterality Date  . DENTAL SURGERY    . LAPAROSCOPIC SIGMOID COLECTOMY  2013  Family History  Problem Relation Age of Onset  . Stroke Father   . Heart attack Father   . Hyperlipidemia Father     Allergies  Allergen Reactions  . Cephalexin   . Dilaudid  [Hydromorphone Hcl] Nausea And Vomiting  . Penicillins     Current Outpatient Medications on File Prior to Visit  Medication Sig Dispense Refill  . CAMILA 0.35 MG tablet Take 1 tablet by mouth daily.  1  . KRILL OIL PO Take 1,000 mg by mouth daily.    Marland Kitchen MAGNESIUM MALATE PO Take 625 mg by mouth daily.    . Melatonin 5 MG CAPS Take 5 mg by mouth at bedtime.    . mometasone (ELOCON) 0.1 % cream Apply 1 application topically daily. 45 g 1   No current facility-administered medications on file prior to visit.     BP 122/82   Pulse 75   Temp 98.1 F (36.7 C) (Oral)   Ht 5\' 10"  (1.778 m)   Wt 194 lb 4 oz (88.1 kg)   LMP 07/28/2017   SpO2 98%   BMI 27.87 kg/m    Objective:   Physical Exam  Constitutional: She  appears well-nourished.  Musculoskeletal:       Right wrist: She exhibits decreased range of motion, tenderness and swelling. She exhibits no bony tenderness and no deformity.  Mild swelling to dorsal wrist. Decrease in ROM with pain to right wrist with rotation and flexion.   Skin: Skin is warm and dry. No erythema.          Assessment & Plan:

## 2017-08-12 NOTE — Assessment & Plan Note (Signed)
Present for the last 9 months, increased symptoms since. Check plain films of wrist today given history of trauma. Discussed to start wearing a brace for support. Rx for diclofenac sent to pharmacy, discussed to try this for at least 1 week.  If no improvement then will have her see Sports Medicine.

## 2017-08-18 ENCOUNTER — Encounter (INDEPENDENT_AMBULATORY_CARE_PROVIDER_SITE_OTHER): Payer: Self-pay | Admitting: Orthopaedic Surgery

## 2017-08-18 ENCOUNTER — Ambulatory Visit (INDEPENDENT_AMBULATORY_CARE_PROVIDER_SITE_OTHER): Payer: 59 | Admitting: Orthopaedic Surgery

## 2017-08-18 DIAGNOSIS — M79641 Pain in right hand: Secondary | ICD-10-CM | POA: Diagnosis not present

## 2017-08-18 MED ORDER — MELOXICAM 7.5 MG PO TABS: ORAL_TABLET | ORAL | 2 refills | Status: DC

## 2017-08-18 MED ORDER — DICLOFENAC SODIUM 2 % TD SOLN: TRANSDERMAL | 5 refills | Status: DC

## 2017-08-18 NOTE — Progress Notes (Signed)
Office Visit Note   Patient: Elaine [name]           Date of Birth: 11-13-1975           MRN: [id number] Visit Date: 08/18/2017              Requested by: Pleas Koch, NP [place], [place] [address] PCP: Pleas Koch, NP   Assessment & Plan: Visit Diagnoses:  1. Pain of right hand     Plan: Impression is right wrist extensor tendinitis.  At this point, I have recommended relative rest.  I have also recommended oral and topical anti-inflammatories over the next 2 weeks.  She will apply ice for swelling and pain.  If she is not any better in the next 4 to 6 weeks, she will follow-up with [place].  Call with concerns or questions in the meantime  Follow-Up Instructions: Return if symptoms worsen or fail to improve.   Orders:  No orders of the defined types were placed in this encounter.  Meds ordered this encounter  Medications  . meloxicam (MOBIC) 7.5 MG tablet    Sig: Take once daily x 2 weeks and then prn pain    Dispense:  30 tablet    Refill:  2  . Diclofenac Sodium (PENNSAID) 2 % SOLN    Sig: Use as directed as needed    Dispense:  1 Bottle    Refill:  5      Procedures: No procedures performed   Clinical Data: No additional findings.   Subjective: Chief Complaint  Patient presents with  . Right Wrist - Pain    HPI patient is a pleasant 42 year old female, right-hand-dominant who presents to our clinic today with right wrist pain.  This began approximately 1 year ago when she tripped over a dog hyperextending her right wrist.  Her pain improved over the course of a few days, but began giving her trouble with her everyday activities which included gardening, doing pottery and Pilates as well as cooking meals.  The pain significantly worsened approximately 1 month ago when she had to perform CPR on her mother-in-law.  Since then she has had pain to the dorsum of the wrist at all times.  Worse with wrist extension.  She has been wearing a brace  and taking anti-inflammatories with minimal relief of symptoms.  Of note, she tried diclofenac which made her dizzy.  No numbness, tingling or burning.  She was seen by her primary care provider where x-rays of her wrist were obtained.  These were negative for fracture or other acute findings.  She has been worked up several times in the past for rheumatoid arthritis but all of the lab work is been negative.  Review of Systems as detailed in HPI.  All others reviewed and are negative.   Objective: Vital Signs: LMP 07/28/2017   Physical Exam well-developed well-nourished female no acute distress.  Alert and oriented x3.  Ortho Exam examination of her right wrist reveals mild swelling.  Pain with wrist extension.  Minimal diffuse tenderness to the dorsum of the wrist.  She is neurovascularly intact distally  Specialty Comments:  No specialty comments available.  Imaging: No new imaging   PMFS History: Patient Active Problem List   Diagnosis Date Noted  . Chronic pain of right wrist 08/12/2017  . Mass of neck 06/01/2017  . Acute sinusitis 03/02/2017  . Preventative health care 12/24/2016  . Hypothyroidism 11/26/2016  . Hyperlipidemia 11/26/2016  .  Vitamin B 12 deficiency 11/26/2016  . Vitamin D deficiency 11/26/2016  . Psoriasis 11/26/2016  . Alpha-1-antitrypsin deficiency ([place]) 11/26/2016  . GERD (gastroesophageal reflux disease) 11/26/2016   Past Medical History:  Diagnosis Date  . Alpha-1-antitrypsin deficiency ([place])   . Diverticulitis    with sepsis  . Diverticulosis   . GERD (gastroesophageal reflux disease)   . 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 Hyperlipidemia   . Hypothyroidism   . Psoriasis   . Ruptured ovarian cyst 2004,2016  . Seizures ([place])     Family History  Problem Relation Age of Onset  . Stroke Father   . Heart attack Father   . Hyperlipidemia Father     Past Surgical History:  Procedure Laterality Date  . DENTAL SURGERY    .  LAPAROSCOPIC SIGMOID COLECTOMY  2013   Social History   Occupational History  . Not on file  Tobacco Use  . Smoking status: Former Research scientist (life sciences)  . Smokeless tobacco: Never Used  Substance and Sexual Activity  . Alcohol use: Yes  . Drug use: Not on file  . Sexual activity: Not on file

## 2017-08-19 ENCOUNTER — Telehealth (INDEPENDENT_AMBULATORY_CARE_PROVIDER_SITE_OTHER): Payer: Self-pay | Admitting: Orthopaedic Surgery

## 2017-08-19 NOTE — Telephone Encounter (Signed)
Patient called stating that her pharmacy never received the RX for the voltaren gel or the oral medication. If they [facility] [place]. Thank you. CB # [phone]

## 2017-08-20 ENCOUNTER — Other Ambulatory Visit (INDEPENDENT_AMBULATORY_CARE_PROVIDER_SITE_OTHER): Payer: Self-pay

## 2017-08-20 MED ORDER — MELOXICAM 7.5 MG PO TABS: ORAL_TABLET | ORAL | 2 refills | Status: DC

## 2017-08-20 MED ORDER — DICLOFENAC SODIUM 2 % TD SOLN: TRANSDERMAL | 5 refills | Status: DC

## 2017-08-20 NOTE — Telephone Encounter (Signed)
REFAXED RX(2) TO [facility].

## 2017-08-21 ENCOUNTER — Other Ambulatory Visit (INDEPENDENT_AMBULATORY_CARE_PROVIDER_SITE_OTHER): Payer: Self-pay

## 2017-08-21 ENCOUNTER — Telehealth (INDEPENDENT_AMBULATORY_CARE_PROVIDER_SITE_OTHER): Payer: Self-pay | Admitting: Orthopaedic Surgery

## 2017-08-21 MED ORDER — DICLOFENAC SODIUM 1 % TD GEL: 2.0000 g | Freq: Four times a day (QID) | TRANSDERMAL | 0 refills | Status: DC

## 2017-08-21 NOTE — Telephone Encounter (Signed)
Will you send in voltaren

## 2017-08-21 NOTE — Telephone Encounter (Signed)
Faxed in voltaren gel

## 2017-08-21 NOTE — Telephone Encounter (Signed)
See message.

## 2017-08-21 NOTE — Telephone Encounter (Signed)
Patient left a voicemail saying pennsaid rx was denied by insurance and she would like to be prescribed something else. # [phone]

## 2017-12-02 ENCOUNTER — Ambulatory Visit: Payer: 59 | Admitting: Primary Care

## 2017-12-02 ENCOUNTER — Encounter: Payer: Self-pay | Admitting: Primary Care

## 2017-12-02 ENCOUNTER — Telehealth (INDEPENDENT_AMBULATORY_CARE_PROVIDER_SITE_OTHER): Payer: Self-pay | Admitting: Orthopaedic Surgery

## 2017-12-02 VITALS — BP 118/80 | HR 92 | Temp 98.8°F | Ht 70.0 in | Wt 198.2 lb

## 2017-12-02 DIAGNOSIS — E049 Nontoxic goiter, unspecified: Secondary | ICD-10-CM | POA: Diagnosis not present

## 2017-12-02 DIAGNOSIS — Z1239 Encounter for other screening for malignant neoplasm of breast: Secondary | ICD-10-CM

## 2017-12-02 DIAGNOSIS — R221 Localized swelling, mass and lump, neck: Secondary | ICD-10-CM

## 2017-12-02 DIAGNOSIS — M25531 Pain in right wrist: Secondary | ICD-10-CM

## 2017-12-02 DIAGNOSIS — R5383 Other fatigue: Secondary | ICD-10-CM

## 2017-12-02 DIAGNOSIS — Z1231 Encounter for screening mammogram for malignant neoplasm of breast: Secondary | ICD-10-CM | POA: Diagnosis not present

## 2017-12-02 DIAGNOSIS — E039 Hypothyroidism, unspecified: Secondary | ICD-10-CM

## 2017-12-02 DIAGNOSIS — G8929 Other chronic pain: Secondary | ICD-10-CM

## 2017-12-02 LAB — CBC
HCT: 42 % (ref 36.0–46.0)
Hemoglobin: 14 g/dL (ref 12.0–15.0)
MCHC: 33.4 g/dL (ref 30.0–36.0)
MCV: 90.7 fl (ref 78.0–100.0)
PLATELETS: 322 10*3/uL (ref 150.0–400.0)
RBC: 4.63 Mil/uL (ref 3.87–5.11)
RDW: 13.2 % (ref 11.5–15.5)
WBC: 13 10*3/uL — ABNORMAL HIGH (ref 4.0–10.5)

## 2017-12-02 LAB — MONONUCLEOSIS SCREEN: Mono Screen: NEGATIVE

## 2017-12-02 NOTE — Telephone Encounter (Signed)
Returned call to patient left message for call back [phone]

## 2017-12-02 NOTE — Assessment & Plan Note (Signed)
Repeat TSH and Free T4 pending.

## 2017-12-02 NOTE — Assessment & Plan Note (Signed)
Evaluated by orthopedics in late May who recommended conservative treatment. Continued symptoms. She will call their office for follow up.

## 2017-12-02 NOTE — Assessment & Plan Note (Signed)
Chronic with negative biopsy several years ago. Repeat US pending. TSH and Free T4 pending.

## 2017-12-02 NOTE — Patient Instructions (Addendum)
Stop by the lab prior to leaving today. I will notify you of your results once received.   You will be contacted regarding your ultrasound.  Please let us know if you have not been contacted within one week.   It was a pleasure to see you today!

## 2017-12-02 NOTE — Progress Notes (Signed)
Subjective:    Patient ID: Elaine [name], female    DOB: 08-Jun-1975, 42 y.o.   MRN: [id number]  HPI  [name] is a 42 year old female with a history of goiter who presents today with a chief complaint of fatigue.   She also reports sore throat, enlarging of her current goiter. She underwent thyroid ultrasound with biopsy 4 years ago, this was benign. She is requesting thyroid testing.  She endorses sleeping well overall except during her menstrual cycle. She wakes up feeling refreshed and goes through her morning routine well until around 9 am. Her fatigue will last from 9 am until 7 pm. She endorses a less nutritious diet over the summer and is planning on getting back on track. She has cut back in alcohol consumption as she initially thought this may be contributing to symptoms. She did notice a return of her GERD and has restarted her omeprazole without much improvement in sore throat.   She denies snoring, periods of apnea during sleep (spouse confirmed). She does experience intermittent spotting between menstrual cycles from her known fibroids. She denies rectal bleeding. She endorses several tick bites this summer.   Review of Systems  Constitutional: Positive for fatigue.  Respiratory: Negative for shortness of breath.   Cardiovascular: Negative for chest pain and palpitations.  Gastrointestinal: Negative for abdominal pain, constipation, diarrhea, nausea and vomiting.       Past Medical History:  Diagnosis Date  . Alpha-1-antitrypsin deficiency ([place])   . Diverticulitis    with sepsis  . Diverticulosis   . Fibroids    Vaginal  . GERD (gastroesophageal reflux disease)   . 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 Hyperlipidemia   . Hypothyroidism   . Psoriasis   . Ruptured ovarian cyst 2004,2016  . Seizures ([place])      Social History   Socioeconomic History  . Marital status: Married    Spouse name: Not on file  . Number of children: Not on file  .  Years of education: Not on file  . Highest education level: Not on file  Occupational History  . Not on file  Social Needs  . Financial resource strain: Not on file  . Food insecurity:    Worry: Not on file    Inability: Not on file  . Transportation needs:    Medical: Not on file    Non-medical: Not on file  Tobacco Use  . Smoking status: Former Research scientist (life sciences)  . Smokeless tobacco: Never Used  Substance and Sexual Activity  . Alcohol use: Yes  . Drug use: Not on file  . Sexual activity: Not on file  Lifestyle  . Physical activity:    Days per week: Not on file    Minutes per session: Not on file  . Stress: Not on file  Relationships  . Social connections:    Talks on phone: Not on file    Gets together: Not on file    Attends religious service: Not on file    Active member of club or organization: Not on file    Attends meetings of clubs or organizations: Not on file    Relationship status: Not on file  . Intimate partner violence:    Fear of current or ex partner: Not on file    Emotionally abused: Not on file    Physically abused: Not on file    Forced sexual activity: Not on file  Other Topics Concern  .  Not on file  Social History Narrative   Married.   1 child.   Works as a Psychologist, sport and exercise.     Past Surgical History:  Procedure Laterality Date  . DENTAL SURGERY    . LAPAROSCOPIC SIGMOID COLECTOMY  2013    Family History  Problem Relation Age of Onset  . Stroke Father   . Heart attack Father   . Hyperlipidemia Father     Allergies  Allergen Reactions  . Cephalexin   . Dilaudid  [Hydromorphone Hcl] Nausea And Vomiting  . Penicillins     Current Outpatient Medications on File Prior to Visit  Medication Sig Dispense Refill  . CAMILA 0.35 MG tablet Take 1 tablet by mouth daily.  1  . KRILL OIL PO Take 1,000 mg by mouth daily.    . Melatonin 5 MG CAPS Take 5 mg by mouth at bedtime.    . mometasone (ELOCON) 0.1 % cream Apply 1 application topically daily. 45 g 1    . omeprazole (PRILOSEC) 20 MG capsule Take 20 mg by mouth daily.    Marland Kitchen ZINC OXIDE PO Take by mouth.     No current facility-administered medications on file prior to visit.     BP 118/80   Pulse 92   Temp 98.8 F (37.1 C) (Oral)   Ht 5\' 10"  (1.778 m)   Wt 198 lb 4 oz (89.9 kg)   LMP 11/01/2017   SpO2 98%   BMI 28.45 kg/m    Objective:   Physical Exam  Constitutional: She appears well-nourished.  Neck: Neck supple.  Obvious mass to left anterior neck upon visualization and palpation. Non tender. Soft. Immobile.   Cardiovascular: Normal rate and regular rhythm.  Respiratory: Effort normal and breath sounds normal.  Skin: Skin is warm and dry.  Psychiatric: She has a normal mood and affect.           Assessment & Plan:

## 2017-12-02 NOTE — Assessment & Plan Note (Signed)
Present for the last several months. Low suspicion for sleep apnea, doesn't seem to be psychological.   Check CBC given spotting and presence of fibroids. Check Lyme panel given tick bites. Also check mono screen, Vitamin D.  Recommended to work on diet and exercise.

## 2017-12-03 LAB — TSH: TSH: 1.13 u[IU]/mL (ref 0.35–4.50)

## 2017-12-03 LAB — VITAMIN D 25 HYDROXY (VIT D DEFICIENCY, FRACTURES): VITD: 58.21 ng/mL (ref 30.00–100.00)

## 2017-12-03 LAB — T4, FREE: FREE T4: 0.65 ng/dL (ref 0.60–1.60)

## 2017-12-03 LAB — B. BURGDORFI ANTIBODIES

## 2017-12-08 ENCOUNTER — Ambulatory Visit
Admission: RE | Admit: 2017-12-08 | Discharge: 2017-12-08 | Disposition: A | Discharge status: Home / Self Care | Payer: 59 | Source: Ambulatory Visit | Attending: Primary Care | Admitting: Primary Care

## 2017-12-08 DIAGNOSIS — E049 Nontoxic goiter, unspecified: Secondary | ICD-10-CM

## 2017-12-09 ENCOUNTER — Telehealth: Payer: Self-pay | Admitting: Primary Care

## 2017-12-09 DIAGNOSIS — R2231 Localized swelling, mass and lump, right upper limb: Secondary | ICD-10-CM

## 2017-12-09 NOTE — Telephone Encounter (Signed)
Elaine [name] had to leave and asked if you can mail her the results.

## 2017-12-09 NOTE — Telephone Encounter (Signed)
Please have her remind me if this is the left or right wrist and which side palm side or other side?

## 2017-12-09 NOTE — Telephone Encounter (Signed)
Spoken to patient today and she requested for [facility] place a referral to have the ganglion cyst to be remove from her wrist.

## 2017-12-09 NOTE — Telephone Encounter (Signed)
Pt is here and need copy of her lab result from last week. She is here with her dad and he is having a nurse visit

## 2017-12-10 ENCOUNTER — Telehealth: Payer: Self-pay | Admitting: Primary Care

## 2017-12-10 NOTE — Telephone Encounter (Signed)
Copied from [place] [id number]. Topic: Quick Communication - See Telephone Encounter >> Dec 10, 2017  3:33 PM Vernona Rieger wrote: CRM for notification. See Telephone encounter for: 12/10/17.  Patient would like her ultra sound results

## 2017-12-11 ENCOUNTER — Encounter: Payer: Self-pay | Admitting: *Deleted

## 2017-12-11 ENCOUNTER — Other Ambulatory Visit: Payer: Self-pay | Admitting: Primary Care

## 2017-12-11 DIAGNOSIS — E041 Nontoxic single thyroid nodule: Secondary | ICD-10-CM

## 2017-12-11 NOTE — Telephone Encounter (Signed)
Spoken and notified patient of Kate Clark's comments. Patient verbalized understanding.  

## 2017-12-11 NOTE — Telephone Encounter (Signed)
Spoken to patient. She stated that it is on the right wrist and on the other side.

## 2017-12-11 NOTE — Telephone Encounter (Signed)
Sending letter with results and Kate Clark's comments for patient.  

## 2017-12-11 NOTE — Telephone Encounter (Signed)
Noted, referral placed.  

## 2017-12-21 ENCOUNTER — Other Ambulatory Visit: Payer: Self-pay | Admitting: Endocrinology

## 2017-12-21 DIAGNOSIS — E049 Nontoxic goiter, unspecified: Secondary | ICD-10-CM

## 2017-12-29 ENCOUNTER — Ambulatory Visit
Admission: RE | Admit: 2017-12-29 | Discharge: 2017-12-29 | Disposition: A | Discharge status: Home / Self Care | Payer: 59 | Source: Ambulatory Visit | Attending: Endocrinology | Admitting: Endocrinology

## 2017-12-29 ENCOUNTER — Other Ambulatory Visit (HOSPITAL_COMMUNITY)
Admission: RE | Admit: 2017-12-29 | Discharge: 2017-12-29 | Disposition: A | Discharge status: Home / Self Care | Payer: 59 | Source: Ambulatory Visit | Attending: Radiology | Admitting: Radiology

## 2017-12-29 DIAGNOSIS — E041 Nontoxic single thyroid nodule: Secondary | ICD-10-CM | POA: Insufficient documentation

## 2017-12-29 DIAGNOSIS — E049 Nontoxic goiter, unspecified: Secondary | ICD-10-CM

## 2017-12-30 ENCOUNTER — Ambulatory Visit (INDEPENDENT_AMBULATORY_CARE_PROVIDER_SITE_OTHER): Payer: 59

## 2017-12-30 DIAGNOSIS — Z23 Encounter for immunization: Secondary | ICD-10-CM

## 2018-01-05 DIAGNOSIS — K219 Gastro-esophageal reflux disease without esophagitis: Secondary | ICD-10-CM | POA: Insufficient documentation

## 2018-01-06 ENCOUNTER — Ambulatory Visit
Admission: RE | Admit: 2018-01-06 | Discharge: 2018-01-06 | Disposition: A | Discharge status: Home / Self Care | Payer: 59 | Source: Ambulatory Visit | Attending: Primary Care | Admitting: Primary Care

## 2018-01-06 DIAGNOSIS — Z1239 Encounter for other screening for malignant neoplasm of breast: Secondary | ICD-10-CM

## 2018-01-18 ENCOUNTER — Other Ambulatory Visit: Payer: Self-pay | Admitting: Orthopedic Surgery

## 2018-01-18 DIAGNOSIS — S63091A Other subluxation of right wrist and hand, initial encounter: Secondary | ICD-10-CM

## 2018-01-22 ENCOUNTER — Ambulatory Visit
Admission: RE | Admit: 2018-01-22 | Discharge: 2018-01-22 | Disposition: A | Discharge status: Home / Self Care | Payer: 59 | Source: Ambulatory Visit | Attending: Orthopedic Surgery | Admitting: Orthopedic Surgery

## 2018-01-22 DIAGNOSIS — S63091A Other subluxation of right wrist and hand, initial encounter: Secondary | ICD-10-CM

## 2018-01-28 ENCOUNTER — Other Ambulatory Visit: Payer: Self-pay | Admitting: Primary Care

## 2018-01-28 DIAGNOSIS — E785 Hyperlipidemia, unspecified: Secondary | ICD-10-CM

## 2018-01-28 DIAGNOSIS — Z0000 Encounter for general adult medical examination without abnormal findings: Secondary | ICD-10-CM

## 2018-02-03 ENCOUNTER — Encounter: Payer: Self-pay | Admitting: Primary Care

## 2018-02-03 ENCOUNTER — Ambulatory Visit (INDEPENDENT_AMBULATORY_CARE_PROVIDER_SITE_OTHER): Payer: 59 | Admitting: Primary Care

## 2018-02-03 VITALS — BP 120/76 | HR 78 | Temp 98.1°F | Ht 70.0 in | Wt 197.5 lb

## 2018-02-03 DIAGNOSIS — G8929 Other chronic pain: Secondary | ICD-10-CM

## 2018-02-03 DIAGNOSIS — M25531 Pain in right wrist: Secondary | ICD-10-CM

## 2018-02-03 DIAGNOSIS — K219 Gastro-esophageal reflux disease without esophagitis: Secondary | ICD-10-CM

## 2018-02-03 DIAGNOSIS — E039 Hypothyroidism, unspecified: Secondary | ICD-10-CM

## 2018-02-03 DIAGNOSIS — E559 Vitamin D deficiency, unspecified: Secondary | ICD-10-CM

## 2018-02-03 DIAGNOSIS — L409 Psoriasis, unspecified: Secondary | ICD-10-CM

## 2018-02-03 DIAGNOSIS — Z0000 Encounter for general adult medical examination without abnormal findings: Secondary | ICD-10-CM | POA: Diagnosis not present

## 2018-02-03 DIAGNOSIS — E785 Hyperlipidemia, unspecified: Secondary | ICD-10-CM

## 2018-02-03 DIAGNOSIS — Z8249 Family history of ischemic heart disease and other diseases of the circulatory system: Secondary | ICD-10-CM

## 2018-02-03 DIAGNOSIS — E538 Deficiency of other specified B group vitamins: Secondary | ICD-10-CM

## 2018-02-03 NOTE — Assessment & Plan Note (Signed)
Vitamin D stable. Continue to monitor.

## 2018-02-03 NOTE — Assessment & Plan Note (Signed)
Using goats milk shampoo bar and doing well. No recent use of mometasone cream.

## 2018-02-03 NOTE — Progress Notes (Signed)
Subjective:    Patient ID: Elaine [name], female    DOB: Jan 31, 1976, 42 y.o.   MRN: [id number]  HPI  Elaine [name] is a 42 year old female who presents today for complete physical.  Immunizations: -Tetanus: Completed in 2015 -Influenza: Completed this season   Diet: She endorses a poor diet Breakfast: Breakfast bar, cereal, eggs, oatmeal Lunch: Left overs Dinner: Vegetables, meat, whole grains Snacks: Candy, chips Desserts: Daily  Beverages: Water, un-sweet tea, alcohol sparingly   Exercise: She is not exercising Eye exam: Due in December 2019 Dental exam: Completes regularly  Pap Smear: Completed in 2018, follows with GYN Mammogram: Completed in October 2019   Review of Systems  Constitutional: Negative for unexpected weight change.  HENT: Negative for rhinorrhea.   Respiratory: Negative for cough and shortness of breath.   Cardiovascular: Negative for chest pain.  Gastrointestinal: Negative for constipation and diarrhea.  Genitourinary: Negative for difficulty urinating and menstrual problem.  Musculoskeletal: Negative for arthralgias and myalgias.  Skin: Negative for rash.  Allergic/Immunologic: Negative for environmental allergies.  Neurological: Negative for dizziness, numbness and headaches.  Psychiatric/Behavioral:       Increased stress at home with father in law. Working to manage.        Past Medical History:  Diagnosis Date  . Alpha-1-antitrypsin deficiency ([place])   . Diverticulitis    with sepsis  . Diverticulosis   . Fibroids    Vaginal  . GERD (gastroesophageal reflux disease)   . 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 Hyperlipidemia   . Hypothyroidism   . Psoriasis   . Ruptured ovarian cyst 2004,2016  . Seizures ([place])      Social History   Socioeconomic History  . Marital status: Married    Spouse name: Not on file  . Number of children: Not on file  . Years of education: Not on file  . Highest education level: Not on  file  Occupational History  . Not on file  Social Needs  . Financial resource strain: Not on file  . Food insecurity:    Worry: Not on file    Inability: Not on file  . Transportation needs:    Medical: Not on file    Non-medical: Not on file  Tobacco Use  . Smoking status: Former Research scientist (life sciences)  . Smokeless tobacco: Never Used  Substance and Sexual Activity  . Alcohol use: Yes  . Drug use: Not on file  . Sexual activity: Not on file  Lifestyle  . Physical activity:    Days per week: Not on file    Minutes per session: Not on file  . Stress: Not on file  Relationships  . Social connections:    Talks on phone: Not on file    Gets together: Not on file    Attends religious service: Not on file    Active member of club or organization: Not on file    Attends meetings of clubs or organizations: Not on file    Relationship status: Not on file  . Intimate partner violence:    Fear of current or ex partner: Not on file    Emotionally abused: Not on file    Physically abused: Not on file    Forced sexual activity: Not on file  Other Topics Concern  . Not on file  Social History Narrative   Married.   1 child.   Works as a Psychologist, sport and exercise.     Past Surgical History:  Procedure Laterality Date  . DENTAL SURGERY    . LAPAROSCOPIC SIGMOID COLECTOMY  2013    Family History  Problem Relation Age of Onset  . Stroke Father 94  . Heart attack Father 36  . Hyperlipidemia Father     Allergies  Allergen Reactions  . Cephalexin   . Dilaudid  [Hydromorphone Hcl] Nausea And Vomiting  . Penicillins     Current Outpatient Medications on File Prior to Visit  Medication Sig Dispense Refill  . CAMILA 0.35 MG tablet Take 1 tablet by mouth daily.  1  . Melatonin 5 MG CAPS Take 5 mg by mouth at bedtime.    . mometasone (ELOCON) 0.1 % cream Apply 1 application topically daily. 45 g 1  . omeprazole (PRILOSEC) 20 MG capsule Take 20 mg by mouth daily.     No current facility-administered  medications on file prior to visit.     BP 120/76   Pulse 78   Temp 98.1 F (36.7 C) (Oral)   Ht 5\' 10"  (1.778 m)   Wt 197 lb 8 oz (89.6 kg)   LMP 01/04/2018   SpO2 98%   BMI 28.34 kg/m    Objective:   Physical Exam  Constitutional: She is oriented to person, place, and time. She appears well-nourished.  HENT:  Mouth/Throat: No oropharyngeal exudate.  Eyes: Pupils are equal, round, and reactive to light. EOM are normal.  Neck: Neck supple. No thyromegaly present.  Cardiovascular: Normal rate and regular rhythm.  Respiratory: [place] normal and breath sounds normal.  GI: Soft. Bowel sounds are normal. There is no tenderness.  Musculoskeletal: Normal range of motion.  Neurological: She is alert and oriented to person, place, and time.  Skin: Skin is warm and dry.  Psychiatric: She has a normal mood and affect.           Assessment & Plan:

## 2018-02-03 NOTE — Assessment & Plan Note (Signed)
Referral placed to cardiology for evaluation. Family history of myocardial infarction in father at age 41 and 39, also with stroke at 23.

## 2018-02-03 NOTE — Assessment & Plan Note (Signed)
Repeat vitamin B12 pending. 

## 2018-02-03 NOTE — Assessment & Plan Note (Signed)
Immunizations UTD. Pap smear UTD. Mammogram UTD. Recommended to work on diet, start exercising. Exam unremarkable. Labs pending. Follow up in 1 year for CPE.

## 2018-02-03 NOTE — Assessment & Plan Note (Signed)
Needing surgery which has been postponed until early next Spring.

## 2018-02-03 NOTE — Assessment & Plan Note (Signed)
Taking omeprazole 20 mg BID and doing well. Has limited use of caffeine, no recent coffee.

## 2018-02-03 NOTE — Assessment & Plan Note (Signed)
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 She is working to get GERD under control to reduce throat fullness. Not taking levothyroxine.

## 2018-02-03 NOTE — Patient Instructions (Signed)
You will be contacted regarding your referral to Cardiology.  Please let us know if you have not been contacted within one week.   Start exercising. You should be getting 150 minutes of moderate intensity exercise weekly.  It's important to improve your diet by reducing consumption of fast food, fried food, processed snack foods, sugary drinks. Increase consumption of fresh vegetables and fruits, whole grains, water.  Ensure you are drinking 64 ounces of water daily.  Remember to take time for yourself, this is crucial!   It was a pleasure to see you today!   Preventive Care 40-64 Years, Female Preventive care refers to lifestyle choices and visits with your health care provider that can promote health and wellness. What does preventive care include?  A yearly physical exam. This is also called an annual well check.  Dental exams once or twice a year.  Routine eye exams. Ask your health care provider how often you should have your eyes checked.  Personal lifestyle choices, including: ? Daily care of your teeth and gums. ? Regular physical activity. ? Eating a healthy diet. ? Avoiding tobacco and drug use. ? Limiting alcohol use. ? Practicing safe sex. ? Taking low-dose aspirin daily starting at age 22. ? Taking vitamin and mineral supplements as recommended by your health care provider. What happens during an annual well check? The services and screenings done by your health care provider during your annual well check will depend on your age, overall health, lifestyle risk factors, and family history of disease. Counseling Your health care provider may ask you questions about your:  Alcohol use.  Tobacco use.  Drug use.  Emotional well-being.  Home and relationship well-being.  Sexual activity.  Eating habits.  Work and work Statistician.  Method of birth control.  Menstrual cycle.  Pregnancy history.  Screening You may have the following tests or  measurements:  Height, weight, and BMI.  Blood pressure.  Lipid and cholesterol levels. These may be checked every 5 years, or more frequently if you are over 37 years old.  Skin check.  Lung cancer screening. You may have this screening every year starting at age 5 if you have a 30-pack-year history of smoking and currently smoke or have quit within the past 15 years.  Fecal occult blood test (FOBT) of the stool. You may have this test every year starting at age 34.  Flexible sigmoidoscopy or colonoscopy. You may have a sigmoidoscopy every 5 years or a colonoscopy every 10 years starting at age 49.  Hepatitis C blood test.  Hepatitis B blood test.  Sexually transmitted disease (STD) testing.  Diabetes screening. This is done by checking your blood sugar (glucose) after you have not eaten for a while (fasting). You may have this done every 1-3 years.  Mammogram. This may be done every 1-2 years. Talk to your health care provider about when you should start having regular mammograms. This may depend on whether you have a family history of breast cancer.  BRCA-related cancer screening. This may be done if you have a family history of breast, ovarian, tubal, or peritoneal cancers.  Pelvic exam and Pap test. This may be done every 3 years starting at age 67. Starting at age 15, this may be done every 5 years if you have a Pap test in combination with an HPV test.  Bone density scan. This is done to screen for osteoporosis. You may have this scan if you are at high risk for osteoporosis.  Discuss your test results, treatment options, and if necessary, the need for more tests with your health care provider. Vaccines Your health care provider may recommend certain vaccines, such as:  Influenza vaccine. This is recommended every year.  Tetanus, diphtheria, and acellular pertussis (Tdap, Td) vaccine. You may need a Td booster every 10 years.  Varicella vaccine. You may need this if  you have not been vaccinated.  Zoster vaccine. You may need this after age 60.  Measles, mumps, and rubella (MMR) vaccine. You may need at least one dose of MMR if you were born in 1957 or later. You may also need a second dose.  Pneumococcal 13-valent conjugate (PCV13) vaccine. You may need this if you have certain conditions and were not previously vaccinated.  Pneumococcal polysaccharide (PPSV23) vaccine. You may need one or two doses if you smoke cigarettes or if you have certain conditions.  Meningococcal vaccine. You may need this if you have certain conditions.  Hepatitis A vaccine. You may need this if you have certain conditions or if you travel or work in places where you may be exposed to hepatitis A.  Hepatitis B vaccine. You may need this if you have certain conditions or if you travel or work in places where you may be exposed to hepatitis B.  Haemophilus influenzae type b (Hib) vaccine. You may need this if you have certain conditions.  Talk to your health care provider about which screenings and vaccines you need and how often you need them. This information is not intended to replace advice given to you by your health care provider. Make sure you discuss any questions you have with your health care provider. Document Released: 04/13/2015 Document Revised: 12/05/2015 Document Reviewed: 01/16/2015 Elsevier Interactive Patient Education  2018 Elsevier Inc.  

## 2018-02-03 NOTE — Assessment & Plan Note (Signed)
Repeat lipids pending. Discussed to work on diet, start with regular exercise.

## 2018-02-04 ENCOUNTER — Other Ambulatory Visit (INDEPENDENT_AMBULATORY_CARE_PROVIDER_SITE_OTHER): Payer: 59

## 2018-02-04 DIAGNOSIS — E538 Deficiency of other specified B group vitamins: Secondary | ICD-10-CM

## 2018-02-04 DIAGNOSIS — E785 Hyperlipidemia, unspecified: Secondary | ICD-10-CM | POA: Diagnosis not present

## 2018-02-04 LAB — COMPREHENSIVE METABOLIC PANEL
ALT: 12 U/L (ref 0–35)
AST: 14 U/L (ref 0–37)
Albumin: 4.5 g/dL (ref 3.5–5.2)
Alkaline Phosphatase: 56 U/L (ref 39–117)
BUN: 12 mg/dL (ref 6–23)
CHLORIDE: 103 meq/L (ref 96–112)
CO2: 28 meq/L (ref 19–32)
CREATININE: 0.67 mg/dL (ref 0.40–1.20)
Calcium: 9.4 mg/dL (ref 8.4–10.5)
GFR: 102.94 mL/min (ref >60.00)
Glucose, Bld: 100 mg/dL — ABNORMAL HIGH (ref 70–99)
Potassium: 4.1 mEq/L (ref 3.5–5.1)
Sodium: 136 mEq/L (ref 135–145)
Total Bilirubin: 1 mg/dL (ref 0.2–1.2)
Total Protein: 7.1 g/dL (ref 6.0–8.3)

## 2018-02-04 LAB — LIPID PANEL
CHOLESTEROL: 212 mg/dL — AB (ref 0–200)
HDL: 47.4 mg/dL (ref >39.00)
LDL CALC: 138 mg/dL — AB (ref 0–99)
NonHDL: 164.3
Total CHOL/HDL Ratio: 4
Triglycerides: 134 mg/dL (ref 0.0–149.0)
VLDL: 26.8 mg/dL (ref 0.0–40.0)

## 2018-02-04 LAB — VITAMIN B12: VITAMIN B 12: 662 pg/mL (ref 211–911)

## 2018-02-05 ENCOUNTER — Encounter: Payer: Self-pay | Admitting: *Deleted

## 2018-02-16 LAB — RESULTS CONSOLE HPV: CHL HPV: NEGATIVE

## 2018-02-16 LAB — HM PAP SMEAR: HM Pap smear: NEGATIVE

## 2018-04-05 ENCOUNTER — Encounter (INDEPENDENT_AMBULATORY_CARE_PROVIDER_SITE_OTHER): Payer: Self-pay

## 2018-04-05 ENCOUNTER — Ambulatory Visit (INDEPENDENT_AMBULATORY_CARE_PROVIDER_SITE_OTHER)
Admission: RE | Admit: 2018-04-05 | Discharge: 2018-04-05 | Disposition: A | Discharge status: Home / Self Care | Payer: 59 | Source: Ambulatory Visit | Attending: Cardiovascular Disease | Admitting: Cardiovascular Disease

## 2018-04-05 ENCOUNTER — Encounter: Payer: Self-pay | Admitting: Cardiovascular Disease

## 2018-04-05 ENCOUNTER — Ambulatory Visit: Payer: 59 | Admitting: Cardiovascular Disease

## 2018-04-05 VITALS — BP 110/90 | HR 78 | Ht 70.0 in | Wt 200.0 lb

## 2018-04-05 DIAGNOSIS — Z8249 Family history of ischemic heart disease and other diseases of the circulatory system: Secondary | ICD-10-CM

## 2018-04-05 DIAGNOSIS — E8801 Alpha-1-antitrypsin deficiency: Secondary | ICD-10-CM

## 2018-04-05 NOTE — Progress Notes (Signed)
Cardiology Office Note:    Date:  04/05/2018   ID:  Elaine [name], DOB Jun 08, 1975, MRN [id number]  PCP:  Pleas Koch, NP  Cardiologist:  Mertie Moores, MD  Electrophysiologist:  None   Referring MD: Pleas Koch, NP   Chief Complaint  Patient presents with  . Family history of CAD     Jan. 6, 2020   Elaine [name] is a 43 y.o. female with a strong family history of premature coronary artery disease.  We are asked to see her today for further evaluation by Pleas Koch, NP   She is seen with her husband, Elaine [name] and father had significant CAD - ( smoked heavily)   She also has episodes of tachycardia  She had an episode of sepsis related to diverticulitis.   She just started exercising. 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   They farm,   ( chickens , ducks, turkeys, gardens ) Has gained 20 lbs this past year -     Started exercising this past Nov.    Walks, has alpha 1 antitripsin deficiency - is not on inhalers  Sees pulmonary   Non smoker   Has leg swelling in the evening           Past Medical History:  Diagnosis Date  . Alpha-1-antitrypsin deficiency ([place])   . Diverticulitis    with sepsis  . Diverticulosis   . Fibroids    Vaginal  . GERD (gastroesophageal reflux disease)   . 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 Hyperlipidemia   . Hypothyroidism   . Psoriasis   . Ruptured ovarian cyst 2004,2016  . Seizures ([place])     Past Surgical History:  Procedure Laterality Date  . DENTAL SURGERY    . LAPAROSCOPIC SIGMOID COLECTOMY  2013    Current Medications: Current Meds  Medication Sig  . CAMILA 0.35 MG tablet Take 1 tablet by mouth daily.  . Melatonin 5 MG CAPS Take 5 mg by mouth at bedtime.     Allergies:   Anesthetics, amide; Cephalexin; Dilaudid  [hydromorphone hcl]; and Penicillins   Social History   Socioeconomic History   . Marital status: Married    Spouse name: Not on file  . Number of children: Not on file  . Years of education: Not on file  . Highest education level: Not on file  Occupational History  . Not on file  Social Needs  . Financial resource strain: Not on file  . Food insecurity:    Worry: Not on file    Inability: Not on file  . Transportation needs:    Medical: Not on file    Non-medical: Not on file  Tobacco Use  . Smoking status: Former Research scientist (life sciences)  . Smokeless tobacco: Never Used  Substance and Sexual Activity  . Alcohol use: Yes  . Drug use: Not on file  . Sexual activity: Not on file  Lifestyle  . Physical activity:    Days per week: Not on file    Minutes per session: Not on file  . Stress: Not on file  Relationships  . Social connections:    Talks on phone: Not on file    Gets together: Not on file    Attends religious service: Not on file    Active member of club or organization: Not on file    Attends  meetings of clubs or organizations: Not on file    Relationship status: Not on file  Other Topics Concern  . Not on file  Social History Narrative   Married.   1 child.   Works as a Psychologist, sport and exercise.      Family History: The patient's family history includes Heart attack (age of onset: 23) in her father; Hyperlipidemia in her father; Stroke (age of onset: 54) in her father.  ROS:   Please see the history of present illness.     All other systems reviewed and are negative.  EKGs/Labs/Other Studies Reviewed:      EKG:    Recent Labs: 12/02/2017: Hemoglobin 14.0; Platelets 322.0; TSH 1.13 02/04/2018: ALT 12; BUN 12; Creatinine, Ser 0.67; Potassium 4.1; Sodium 136  Recent Lipid Panel    Component Value Date/Time   CHOL 212 (H) 02/04/2018 1104   TRIG 134.0 02/04/2018 1104   HDL 47.40 02/04/2018 1104   CHOLHDL 4 02/04/2018 1104   VLDL 26.8 02/04/2018 1104   LDLCALC 138 (H) 02/04/2018 1104    Physical Exam:    VS:  BP 110/90   Pulse 78   Ht 5\' 10"  (1.778 m)   Wt  200 lb (90.7 kg)   SpO2 96%   BMI 28.70 kg/m     Wt Readings from Last 3 Encounters:  04/05/18 200 lb (90.7 kg)  02/03/18 197 lb 8 oz (89.6 kg)  12/02/17 198 lb 4 oz (89.9 kg)     GEN:  Well nourished, well developed in no acute distress HEENT: Normal NECK: No JVD; No carotid bruits LYMPHATICS: No lymphadenopathy CARDIAC:   RR,  Prominent S2  RESPIRATORY:  Clear to auscultation without rales, wheezing or rhonchi  ABDOMEN: Soft, non-tender, non-distended MUSCULOSKELETAL:  No edema; No deformity  SKIN: Warm and dry NEUROLOGIC:  Alert and oriented x 3 PSYCHIATRIC:  Normal affect   ASSESSMENT:    1. AAT (alpha-1-antitrypsin) deficiency ([place])   2. Family history of premature CAD    PLAN:    In order of problems listed above:  1.  family hx of CAD :    She has a strong family history of coronary artery disease although the members of her family who had heart attacks also were very heavy smokers. Does have moderate hyperlipidemia with an LDL of 138. He somewhat hesitant to consider statin therapy because of her alpha-1 antitrypsin deficiency. Get a coronary calcium score for further evaluation.  If her coronary calcium score is high or moderately elevated then we should consider statin therapy. Advised her to work on a good diet, exercise, weight loss program.    2.  Alpha 1 antitripsin deficiency .  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  I am concerned that she might have pulmonary hypertension related to her lung disease related to the alpha-1 antitrypsin deficiency. Some point I think an echocardiogram would be useful.   3.  Hyperlipidemia: Her LDL is 138. Her doctors have not wanted to start her on a statin because of her alpha-1 antitrypsin deficiency and in the related liver issues. Would like to do a coronary calcium score.  Coronary calcium score is 0 then I think that we can hold off and not start a statin.  If she has significant coronary artery  calcifications, then I think that we should be aggressive with starting statin therapy and monitor her liver enzymes very closely.    Medication Adjustments/Labs and Tests Ordered: Current medicines are reviewed at length  with the patient today.  Concerns regarding medicines are outlined above.  Orders Placed This Encounter  Procedures  . CT CARDIAC SCORING  . Lipid Profile  . Basic Metabolic Panel (BMET)  . Hepatic function panel  . EKG 12-Lead   No orders of the defined types were placed in this encounter.   Patient Instructions  Medication Instructions:  Your physician recommends that you continue on your current medications as directed. Please refer to the Current Medication list given to you today.  If you need a refill on your cardiac medications before your next appointment, please call your pharmacy.    Lab work: Your physician recommends that you return for lab work in: 6 months on the day of or a few days before your office visit with Dr. Acie Fredrickson.  You will need to FAST for this appointment - nothing to eat or drink after midnight the night before except water.    Testing/Procedures: Your physician recommends that you get a Coronary Calcium Score. (CAT scanning), is a noninvasive, special x-ray that produces cross-sectional images of the body using x-rays and a computer. CT scans help physicians diagnose and treat medical conditions. For some CT exams, a contrast material is used to enhance visibility in the area of the body being studied. CT scans provide greater clarity and reveal more details than regular x-ray exams.    Follow-Up: At [facility], you and your health needs are our priority.  As part of our continuing mission to provide you with exceptional heart care, we have created designated Provider Care Teams.  These Care Teams include your primary Cardiologist (physician) and Advanced Practice Providers (APPs -  Physician Assistants and Nurse Practitioners)  who all work together to provide you with the care you need, when you need it. You will need a follow up appointment in:  6 months.  Please call our office 2 months in advance to schedule this appointment.  You may see Mertie Moores, MD or one of the following Advanced Practice Providers on your designated Care Team: Richardson Dopp, PA-C [place], [place] . Daune Perch, NP       Signed, Mertie Moores, MD  04/05/2018 11:03 AM    Elaine [name]

## 2018-04-05 NOTE — Patient Instructions (Addendum)
Medication Instructions:  Your physician recommends that you continue on your current medications as directed. Please refer to the Current Medication list given to you today.  If you need a refill on your cardiac medications before your next appointment, please call your pharmacy.    Lab work: Your physician recommends that you return for lab work in: 6 months on the day of or a few days before your office visit with Dr. Acie Fredrickson.  You will need to FAST for this appointment - nothing to eat or drink after midnight the night before except water.    Testing/Procedures: Your physician recommends that you get a Coronary Calcium Score. (CAT scanning), is a noninvasive, special x-ray that produces cross-sectional images of the body using x-rays and a computer. CT scans help physicians diagnose and treat medical conditions. For some CT exams, a contrast material is used to enhance visibility in the area of the body being studied. CT scans provide greater clarity and reveal more details than regular x-ray exams.    Follow-Up: At [facility], you and your health needs are our priority.  As part of our continuing mission to provide you with exceptional heart care, we have created designated Provider Care Teams.  These Care Teams include your primary Cardiologist (physician) and Advanced Practice Providers (APPs -  Physician Assistants and Nurse Practitioners) who all work together to provide you with the care you need, when you need it. You will need a follow up appointment in:  6 months.  Please call our office 2 months in advance to schedule this appointment.  You may see Mertie Moores, MD or one of the following Advanced Practice Providers on your designated Care Team: Richardson Dopp, PA-C [place], [place] . Daune Perch, NP

## 2018-05-31 ENCOUNTER — Ambulatory Visit: Payer: 59 | Admitting: Primary Care

## 2018-05-31 ENCOUNTER — Encounter: Payer: Self-pay | Admitting: Primary Care

## 2018-05-31 VITALS — BP 120/86 | HR 69 | Temp 98.0°F | Ht 70.0 in | Wt 204.2 lb

## 2018-05-31 DIAGNOSIS — G43109 Migraine with aura, not intractable, without status migrainosus: Secondary | ICD-10-CM

## 2018-05-31 MED ORDER — ZOLMITRIPTAN 2.5 MG PO TABS: ORAL_TABLET | ORAL | 0 refills | Status: DC

## 2018-05-31 MED ORDER — KETOROLAC TROMETHAMINE 60 MG/2ML IM SOLN
60.0000 mg | Freq: Once | INTRAMUSCULAR | Status: AC
  Administered 2018-05-31: 60 mg via INTRAMUSCULAR

## 2018-05-31 NOTE — Addendum Note (Signed)
Addended by: Jacqualin Combes on: 05/31/2018 12:22 PM   Modules accepted: Orders

## 2018-05-31 NOTE — Patient Instructions (Signed)
You may take the Zomig as needed for migraines. Take 1 tablet at migraine onset, may repeat in 2 hours if migraine persists.   Please notify me if you experience more than 2 migraines monthly.  It was a pleasure to see you today!

## 2018-05-31 NOTE — Progress Notes (Signed)
Subjective:    Patient ID: Elaine [name], female    DOB: 20-Sep-1975, 43 y.o.   MRN: [id number]  HPI  Elaine [name] is a 43 year old female with a history of migraines who presents today with a chief complaint of migraine.  She has a prior history of migraines that date back 15 years ago. Was once managed on Topamax daily which she took for one year. This caused hair loss, numbness to feet and hands. During management on Topamax she hardly had migraines, came off after one year. She was managed on Imitrex, Relpax, and Zomig at one time. Imitrex caused side effects. She did the best on Zomig.   Her migraines are located behind her right eye. Auras include "split vision" and word finding. She will experience photophobia, nausea, phonophobia. Her migraines had typically been around her menstrual cycles. She stopped her OCP's in November 2019 due to elevations in BP. She also stopped caffeine in November 2019.  Over the last one week she had three migraines with her last one being three days ago. She took [employer] and energy drinks.   Review of Systems  Eyes: Negative for photophobia.  Respiratory: Negative for shortness of breath.   Gastrointestinal: Negative for nausea.  Neurological: Positive for headaches.       Past Medical History:  Diagnosis Date  . Alpha-1-antitrypsin deficiency ([place])   . Diverticulitis    with sepsis  . Diverticulosis   . Fibroids    Vaginal  . GERD (gastroesophageal reflux disease)   . 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 Hyperlipidemia   . Hypothyroidism   . Psoriasis   . Ruptured ovarian cyst 2004,2016  . Seizures ([place])      Social History   Socioeconomic History  . Marital status: Married    Spouse name: Not on file  . Number of children: Not on file  . Years of education: Not on file  . Highest education level: Not on file  Occupational History  . Not on file  Social Needs  . Financial resource strain: Not on file  . Food  insecurity:    Worry: Not on file    Inability: Not on file  . Transportation needs:    Medical: Not on file    Non-medical: Not on file  Tobacco Use  . Smoking status: Former Research scientist (life sciences)  . Smokeless tobacco: Never Used  Substance and Sexual Activity  . Alcohol use: Yes  . Drug use: Not on file  . Sexual activity: Not on file  Lifestyle  . Physical activity:    Days per week: Not on file    Minutes per session: Not on file  . Stress: Not on file  Relationships  . Social connections:    Talks on phone: Not on file    Gets together: Not on file    Attends religious service: Not on file    Active member of club or organization: Not on file    Attends meetings of clubs or organizations: Not on file    Relationship status: Not on file  . Intimate partner violence:    Fear of current or ex partner: Not on file    Emotionally abused: Not on file    Physically abused: Not on file    Forced sexual activity: Not on file  Other Topics Concern  . Not on file  Social History Narrative   Married.   1 child.   Works  as a Psychologist, sport and exercise.     Past Surgical History:  Procedure Laterality Date  . DENTAL SURGERY    . LAPAROSCOPIC SIGMOID COLECTOMY  2013    Family History  Problem Relation Age of Onset  . Stroke Father 87  . Heart attack Father 72  . Hyperlipidemia Father     Allergies  Allergen Reactions  . Anesthetics, Amide Anaphylaxis  . Cephalexin   . Dilaudid  [Hydromorphone Hcl] Nausea And Vomiting  . Penicillins     Current Outpatient Medications on File Prior to Visit  Medication Sig Dispense Refill  . MAGNESIUM PO Take 1,250 mg by mouth daily.    . Melatonin 5 MG CAPS Take 5 mg by mouth at bedtime.     No current facility-administered medications on file prior to visit.     BP 120/86   Pulse 69   Temp 98 F (36.7 C) (Oral)   Ht 5\' 10"  (1.778 m)   Wt 204 lb 4 oz (92.6 kg)   LMP 05/28/2018   SpO2 98%   BMI 29.31 kg/m    Objective:   Physical Exam    Constitutional: She appears well-nourished.  Eyes: EOM are normal.  Neck: Neck supple.  Cardiovascular: Normal rate.  Respiratory: Effort normal.  Neurological: No cranial nerve deficit.  Skin: Skin is warm and dry.           Assessment & Plan:

## 2018-05-31 NOTE — Assessment & Plan Note (Signed)
Prior history of years ago, no recent episodes until lats week. Neuro exam today unremarkable. IM Toradol provided today. Rx for Zomig sent to pharmacy for abortive treatment.  Discussed to notify if migraines become more recurrent, more than 2 times monthly.

## 2018-08-25 IMAGING — DX DG CHEST 2V
2 series · 2 of 2 positions shown · non-contrast
Comparison: None in PACs

CLINICAL DATA: Alpha 1 antitrypsin deficiency, no current
complaints. New patient visit.

EXAM:
CHEST  2 VIEW

[chest pa]
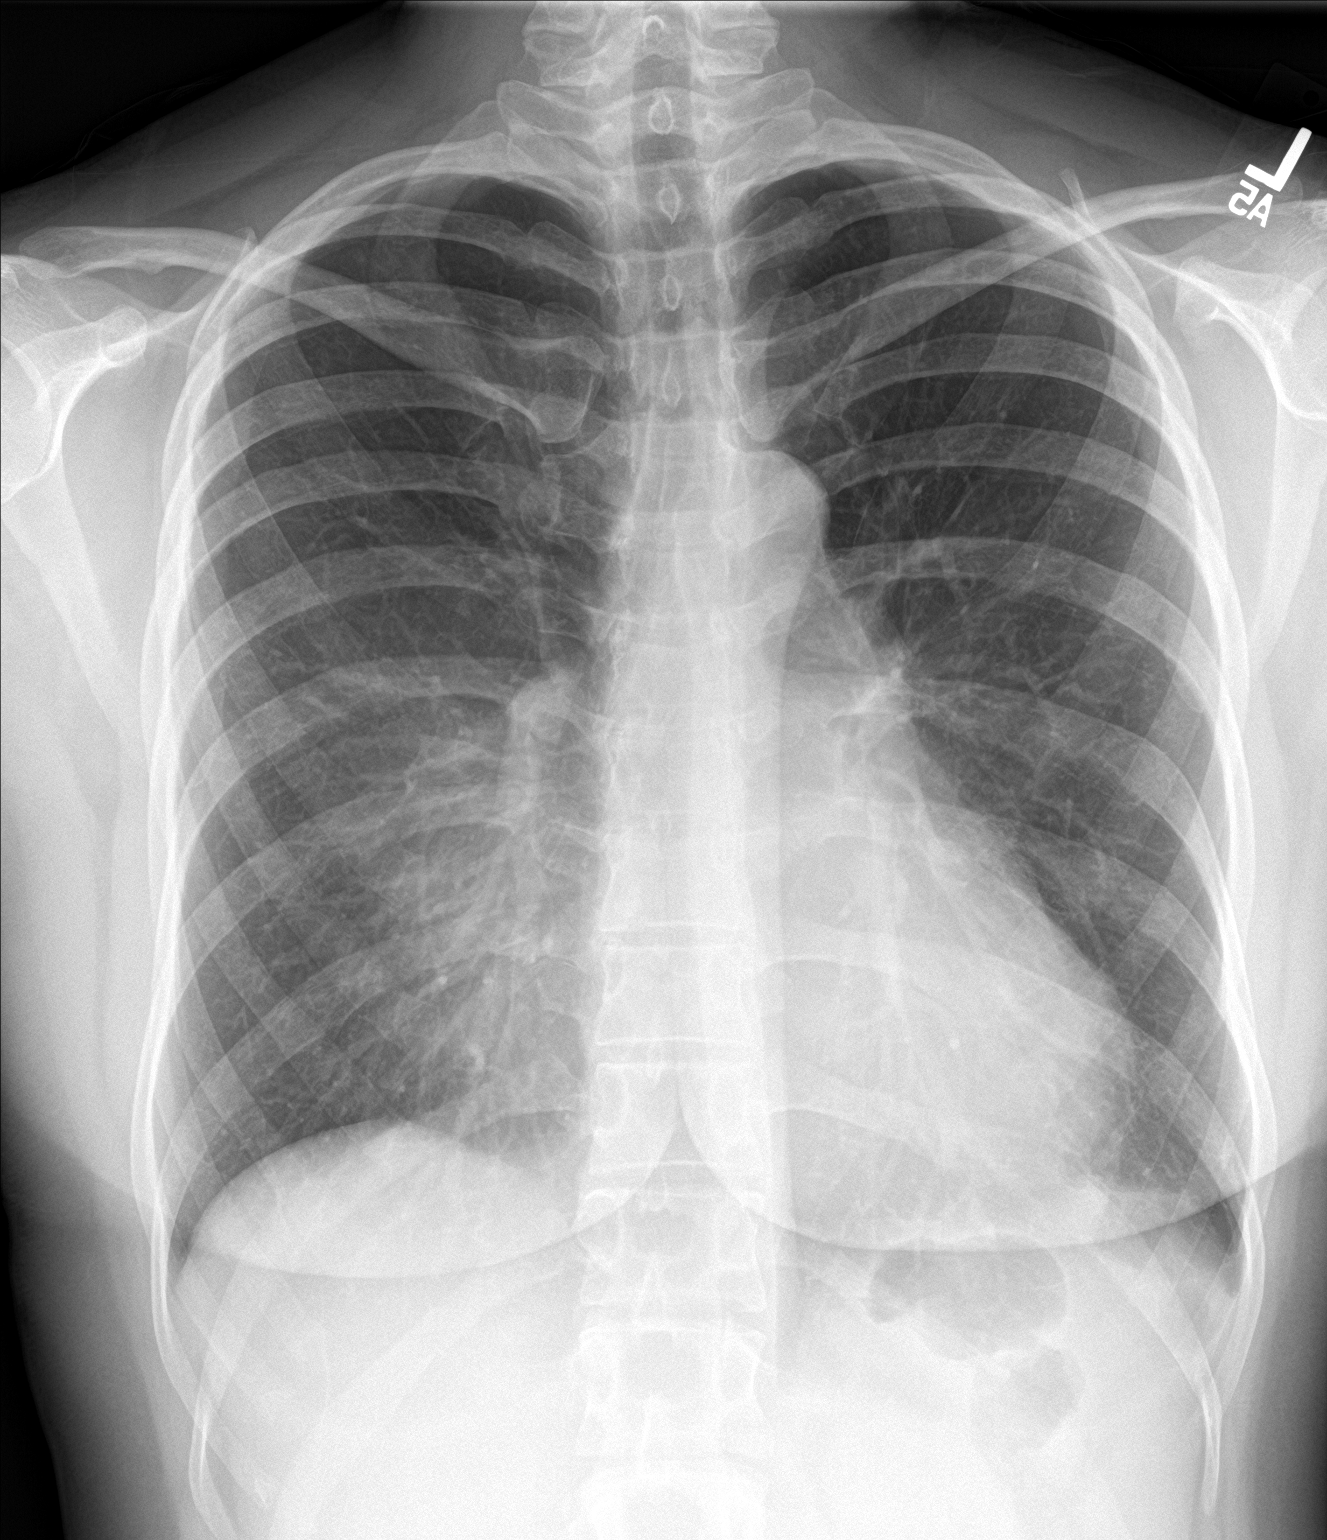

[chest lat]
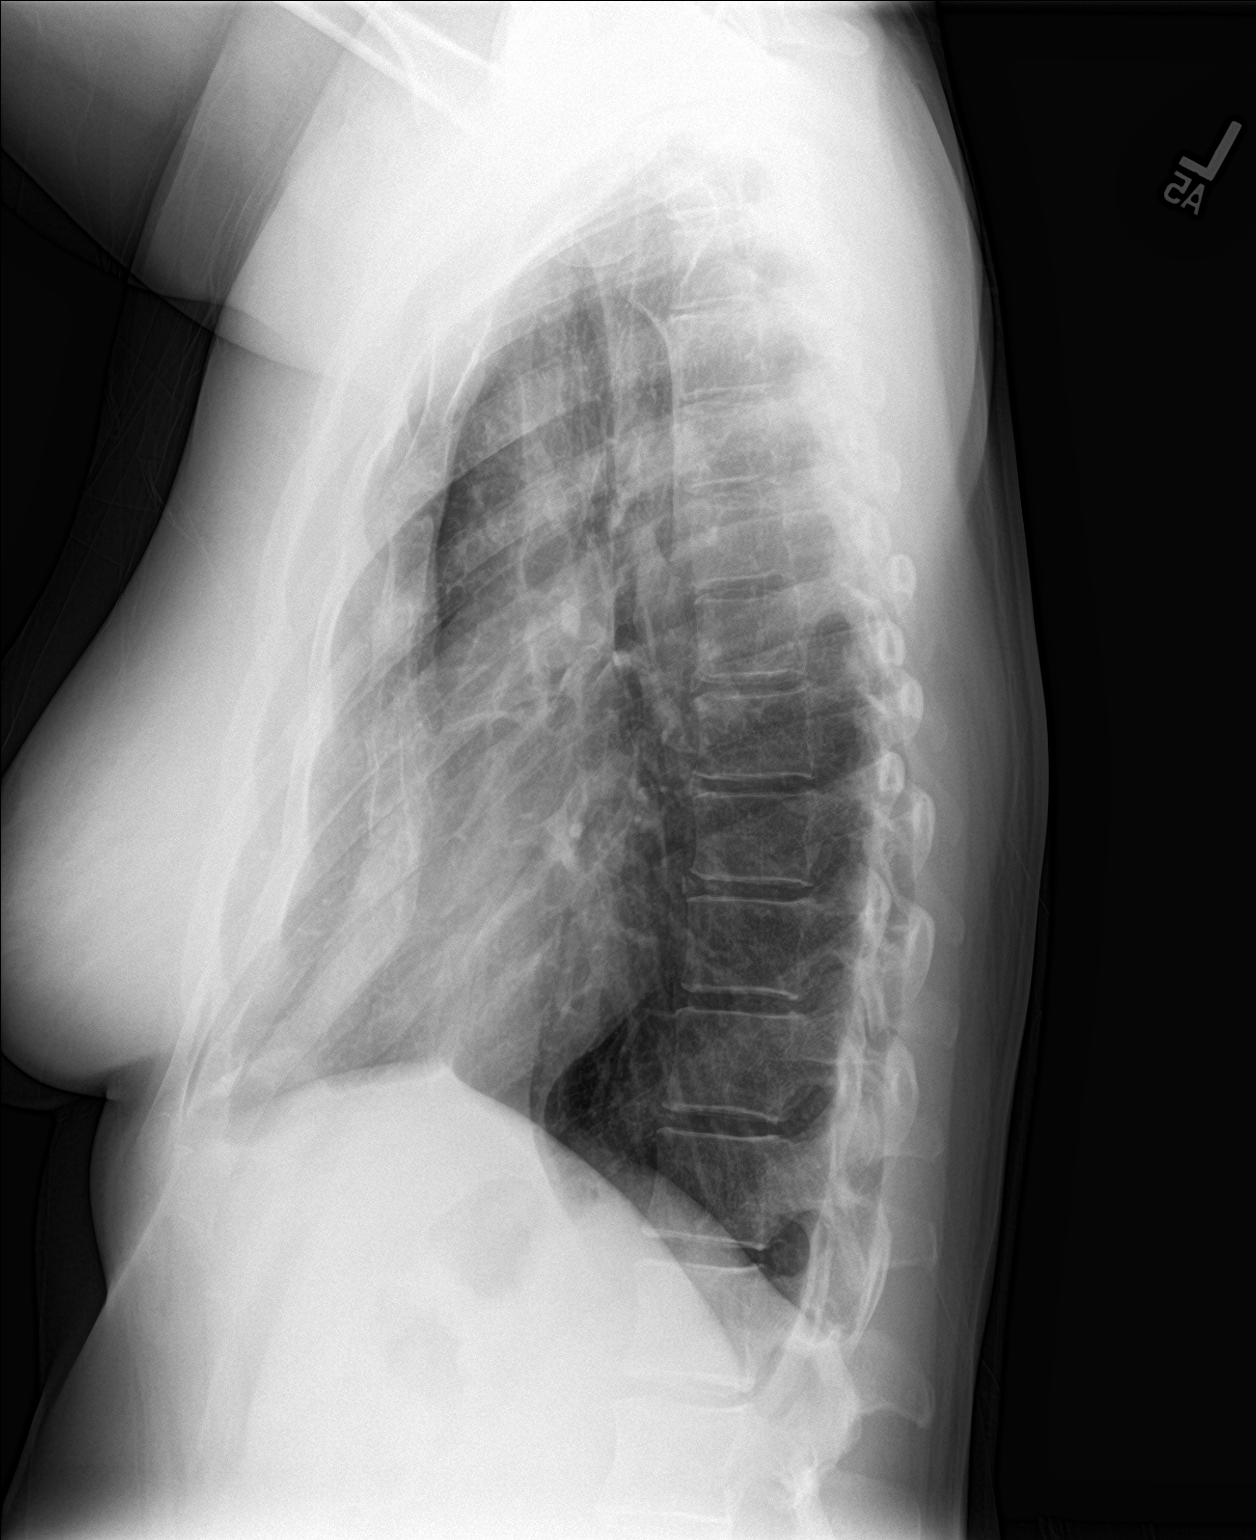

[2 of 2 positions shown; findings below may reference images not displayed]

FINDINGS: There is a pectus excavatum type chest contour which accentuates the
right heart border. The lungs are mildly hyperinflated and clear.
The heart and pulmonary vascularity are normal. The mediastinum is
normal in width. There is no pleural effusion.
IMPRESSION: There is no acute cardiopulmonary abnormality. A pectus excavatum
type chest contour accentuates the right heart border.

## 2018-10-04 ENCOUNTER — Other Ambulatory Visit: Payer: Self-pay

## 2018-10-04 ENCOUNTER — Other Ambulatory Visit: Payer: 59 | Admitting: *Deleted

## 2018-10-04 DIAGNOSIS — E8801 Alpha-1-antitrypsin deficiency: Secondary | ICD-10-CM

## 2018-10-04 DIAGNOSIS — Z8249 Family history of ischemic heart disease and other diseases of the circulatory system: Secondary | ICD-10-CM

## 2018-10-04 LAB — HEPATIC FUNCTION PANEL
ALT: 12 IU/L (ref 0–32)
AST: 14 IU/L (ref 0–40)
Albumin: 4.4 g/dL (ref 3.8–4.8)
Alkaline Phosphatase: 63 IU/L (ref 39–117)
Bilirubin Total: 0.3 mg/dL (ref 0.0–1.2)
Bilirubin, Direct: 0.1 mg/dL (ref 0.00–0.40)
Total Protein: 6.5 g/dL (ref 6.0–8.5)

## 2018-10-04 LAB — LIPID PANEL
Chol/HDL Ratio: 4.6 ratio — ABNORMAL HIGH (ref 0.0–4.4)
Cholesterol, Total: 237 mg/dL — ABNORMAL HIGH (ref 100–199)
HDL: 52 mg/dL (ref >39)
LDL Calculated: 155 mg/dL — ABNORMAL HIGH (ref 0–99)
Triglycerides: 151 mg/dL — ABNORMAL HIGH (ref 0–149)
VLDL Cholesterol Cal: 30 mg/dL (ref 5–40)

## 2018-10-04 LAB — BASIC METABOLIC PANEL
BUN/Creatinine Ratio: 12 (ref 9–23)
BUN: 10 mg/dL (ref 6–24)
CO2: 23 mmol/L (ref 20–29)
Calcium: 9.5 mg/dL (ref 8.7–10.2)
Chloride: 104 mmol/L (ref 96–106)
Creatinine, Ser: 0.84 mg/dL (ref 0.57–1.00)
GFR calc Af Amer: 100 mL/min/{1.73_m2} (ref >59)
GFR calc non Af Amer: 87 mL/min/{1.73_m2} (ref >59)
Glucose: 99 mg/dL (ref 65–99)
Potassium: 4.3 mmol/L (ref 3.5–5.2)
Sodium: 138 mmol/L (ref 134–144)

## 2018-10-05 ENCOUNTER — Telehealth: Payer: Self-pay | Admitting: Nurse Practitioner

## 2018-10-05 DIAGNOSIS — E782 Mixed hyperlipidemia: Secondary | ICD-10-CM

## 2018-10-05 MED ORDER — EZETIMIBE 10 MG PO TABS: 10.0000 mg | ORAL_TABLET | Freq: Every day | ORAL | 11 refills | Status: DC

## 2018-10-05 NOTE — Telephone Encounter (Signed)
YOUR CARDIOLOGY TEAM HAS ARRANGED FOR AN E-VISIT FOR YOUR APPOINTMENT - PLEASE REVIEW IMPORTANT INFORMATION BELOW SEVERAL DAYS PRIOR TO YOUR APPOINTMENT  Due to the recent COVID-19 pandemic, we are transitioning in-person office visits to tele-medicine visits in an effort to decrease unnecessary exposure to our patients, their families, and staff. These visits are billed to your insurance just like a normal visit is. We also encourage you to sign up for MyChart if you have not already done so. You will need a smartphone if possible. For patients that do not have this, we can still complete the visit using a regular telephone but do prefer a smartphone to enable video when possible. You may have a family member that lives with you that can help. If possible, we also ask that you have a blood pressure cuff and scale at home to measure your blood pressure, heart rate and weight prior to your scheduled appointment. Patients with clinical needs that need an in-person evaluation and testing will still be able to come to the office if absolutely necessary. If you have any questions, feel free to call our office.     YOUR PROVIDER WILL BE USING THE FOLLOWING PLATFORM TO COMPLETE YOUR VISIT: Doxy.Me   . IF USING DOXIMITY or [email or web address].ME - The staff will give you instructions on receiving your link to join the meeting the day of your visit.      2-3 DAYS BEFORE YOUR APPOINTMENT  You will receive a telephone call from one of our [facility] team members - your caller ID may say "Unknown caller." If this is a video visit, we will walk you through how to get the video launched on your phone. We will remind you check your blood pressure, heart rate and weight prior to your scheduled appointment. If you have an Apple Watch or Kardia, please upload any pertinent ECG strips the day before or morning of your appointment to [place]. Our staff will also make sure you have reviewed the consent and agree to move forward with  your scheduled tele-health visit.     THE DAY OF YOUR APPOINTMENT  Approximately 15 minutes prior to your scheduled appointment, you will receive a telephone call from one of [facility] team - your caller ID may say "Unknown caller."  Our staff will confirm medications, vital signs for the day and any symptoms you may be experiencing. Please have this information available prior to the time of visit start. It may also be helpful for you to have a pad of paper and pen handy for any instructions given during your visit. They will also walk you through joining the smartphone meeting if this is a video visit.    CONSENT FOR TELE-HEALTH VISIT - PLEASE REVIEW  I hereby voluntarily request, consent and authorize [facility] and its employed or contracted physicians, physician assistants, nurse practitioners or other licensed health care professionals (the Practitioner), to provide me with telemedicine health care services (the "Services") as deemed necessary by the treating Practitioner. I acknowledge and consent to receive the Services by the Practitioner via telemedicine. I understand that the telemedicine visit will involve communicating with the Practitioner through live audiovisual communication technology and the disclosure of certain medical information by electronic transmission. I acknowledge that I have been given the opportunity to request an in-person assessment or other available alternative prior to the telemedicine visit and am voluntarily participating in the telemedicine visit.  I understand that I have the right to withhold or withdraw my consent to  the use of telemedicine in the course of my care at any time, without affecting my right to future care or treatment, and that the Practitioner or I may terminate the telemedicine visit at any time. I understand that I have the right to inspect all information obtained and/or recorded in the course of the telemedicine visit and may receive  copies of available information for a reasonable fee.  I understand that some of the potential risks of receiving the Services via telemedicine include:  Marland Kitchen Delay or interruption in medical evaluation due to technological equipment failure or disruption; . Information transmitted may not be sufficient (e.g. poor resolution of images) to allow for appropriate medical decision making by the Practitioner; and/or  . In rare instances, security protocols could fail, causing a breach of personal health information.  Furthermore, I acknowledge that it is my responsibility to provide information about my medical history, conditions and care that is complete and accurate to the best of my ability. I acknowledge that Practitioner's advice, recommendations, and/or decision may be based on factors not within their control, such as incomplete or inaccurate data provided by me or distortions of diagnostic images or specimens that may result from electronic transmissions. I understand that the practice of medicine is not an exact science and that Practitioner makes no warranties or guarantees regarding treatment outcomes. I acknowledge that I will receive a copy of this consent concurrently upon execution via email to the email address I last provided but may also request a printed copy by calling the office of [facility].    I understand that my insurance will be billed for this visit.   I have read or had this consent read to me. . I understand the contents of this consent, which adequately explains the benefits and risks of the Services being provided via telemedicine.  . I have been provided ample opportunity to ask questions regarding this consent and the Services and have had my questions answered to my satisfaction. . I give my informed consent for the services to be provided through the use of telemedicine in my medical care  By participating in this telemedicine visit I agree to the above.

## 2018-10-05 NOTE — Telephone Encounter (Signed)
-----   Message from Thayer Headings, MD sent at 10/05/2018 10:17 AM EDT ----- Chol and LDL cholesterols levels are higher. She has a strong family hx of CAD but her coronary calcium score is 0  I would recommend that she work very hard to lower her chol levels She has not wanted to take a statin because of her alpha 1 antitripsyn deficiency . Please have her work on weight loss,  Regular exercise,  Lets start zetia 10 mg a day .   Check lipids, liver , bmp in 3 months. Will discuss further at her next office visit

## 2018-10-05 NOTE — Telephone Encounter (Signed)
Reviewed results and plan of care with patient who agrees to start Zetia 10 mg once daily. She states she is exercising daily and has worked on better diet. States she will continue to work on diet and exercise and is monitoring a goiter on her thyroid. She is scheduled for a virtual appointment with Dr. Acie Fredrickson on 7/16. Lab appointment is scheduled for October 8. I advised her to call back with questions prior to follow-up and she thanked me for the call.

## 2018-10-13 NOTE — Progress Notes (Signed)
Virtual Visit via Video Note   This visit type was conducted due to national recommendations for restrictions regarding the COVID-19 Pandemic (e.g. social distancing) in an effort to limit this patient's exposure and mitigate transmission in our community.  Due to her co-morbid illnesses, this patient is at least at moderate risk for complications without adequate follow up.  This format is felt to be most appropriate for this patient at this time.  All issues noted in this document were discussed and addressed.  A limited physical exam was performed with this format.  Please refer to the patient's chart for her consent to telehealth for [facility].   Date:  10/13/2018   ID:  Elaine [name], DOB Nov 23, 1975, MRN [id number]  Patient Location: Home Provider Location: Home  PCP:  Pleas Koch, NP  Cardiologist:  Mertie Moores, MD  Electrophysiologist:  None   Problem List 1.  Alpha 1 antitrypsin deficiency 2.  Family hx of CAD   Jan. 6, 2020   Elaine [name] is a 43 y.o. female with a strong family history of premature coronary artery disease.  We are asked to see her today for further evaluation by Pleas Koch, NP   She is seen with her husband, Thora Lance and father had significant CAD - ( smoked heavily)   She also has episodes of tachycardia  She had an episode of sepsis related to diverticulitis.   She just started exercising. 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   They farm,   ( chickens , ducks, turkeys, gardens ) Has gained 20 lbs this past year -     Started exercising this past Nov.    Walks, has alpha 1 antitripsin deficiency - is not on inhalers  Sees pulmonary   Non smoker   Has leg swelling in the evening      Evaluation Performed:  Follow-Up Visit  Chief Complaint:  Hyperlipidemia    October 14, 2018     Elaine [name] is a 43 y.o. female with a strong  family hx of CAD and is a carrier for alpha-1 antitrypsin deficiency Coronary calcium score in Jan was 0  We have discussed getting an echo at some point ( she has a prominent 2nd heart sound)  Father in law is staying with them .    Wt. Is 202 lbs okay  Sister had covid,  Is obese, is carrier for alpha 1 AT def.     Was doing well on the bike but has been working out on farm.   BP and HR are normal since she stopped taking the birth control   Chol increased ( she was eating Duck egg custard)  We placed her on Zetia.  Denies any chest pain denies any shortness of breath.  Is willing to get back into her exercise program. The patient does not have symptoms concerning for COVID-19 infection (fever, chills, cough, or new shortness of breath).    Past Medical History:  Diagnosis Date  . Alpha-1-antitrypsin deficiency ([place])   . Diverticulitis    with sepsis  . Diverticulosis   . Fibroids    Vaginal  . GERD (gastroesophageal reflux disease)   . 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 Hyperlipidemia   . Hypothyroidism   . Psoriasis   . Ruptured ovarian cyst 2004,2016  . Seizures ([place])    Past  Surgical History:  Procedure Laterality Date  . DENTAL SURGERY    . LAPAROSCOPIC SIGMOID COLECTOMY  2013     No outpatient medications have been marked as taking for the 10/14/18 encounter (Appointment) with Nahser, Wonda Cheng, MD.     Allergies:   Anesthetics, amide; Cephalexin; Dilaudid  [hydromorphone hcl]; and Penicillins   Social History   Tobacco Use  . Smoking status: Former Research scientist (life sciences)  . Smokeless tobacco: Never Used  Substance Use Topics  . Alcohol use: Yes  . Drug use: Not on file     Family Hx: The patient's family history includes Heart attack (age of onset: 72) in her father; Hyperlipidemia in her father; Stroke (age of onset: 74) in her father.  ROS:   Please see the history of present illness.     All other systems reviewed and are negative.    Prior CV studies:   The following studies were reviewed today:    Labs/Other Tests and Data Reviewed:    EKG:  No ECG reviewed.  Recent Labs: 12/02/2017: Hemoglobin 14.0; Platelets 322.0; TSH 1.13 10/04/2018: ALT 12; BUN 10; Creatinine, Ser 0.84; Potassium 4.3; Sodium 138   Recent Lipid Panel Lab Results  Component Value Date/Time   CHOL 237 (H) 10/04/2018 08:55 AM   TRIG 151 (H) 10/04/2018 08:55 AM   HDL 52 10/04/2018 08:55 AM   CHOLHDL 4.6 (H) 10/04/2018 08:55 AM   CHOLHDL 4 02/04/2018 11:04 AM   LDLCALC 155 (H) 10/04/2018 08:55 AM    Wt Readings from Last 3 Encounters:  05/31/18 204 lb 4 oz (92.6 kg)  04/05/18 200 lb (90.7 kg)  02/03/18 197 lb 8 oz (89.6 kg)     Objective:    Vital Signs:  There were no vitals taken for this visit.   VITAL SIGNS:  reviewed GEN:  no acute distress EYES:  sclerae anicteric, EOMI - Extraocular Movements Intact RESPIRATORY:  normal respiratory effort, symmetric expansion CARDIOVASCULAR:  no peripheral edema SKIN:  no rash, lesions or ulcers. MUSCULOSKELETAL:  no obvious deformities. NEURO:  alert and oriented x 3, no obvious focal deficit PSYCH:  normal affect  ASSESSMENT & PLAN:    1. Hyperlipidemia: Her LDL is up to 155.  She attributes this to eating lots of duck egg custard over the past several months.  She is not been quite as active but has been busy on the farm.  I encouraged her to watch her diet.  I encouraged her to get some regular aerobic exercise.  We have started her on Zetia 10 mg a day.  We will check fasting lipids, liver enzymes, basic metabolic profile in 3 months.  I will see her in the office in 6 months.  Her coronary calcium score was 0 in 2018.  COVID-19 Education: The signs and symptoms of COVID-19 were discussed with the patient and how to seek care for testing (follow up with PCP or arrange E-visit).  The importance of social distancing was discussed today.  Time:   Today, I have spent  21  minutes with  the patient with telehealth technology discussing the above problems.     Medication Adjustments/Labs and Tests Ordered: Current medicines are reviewed at length with the patient today.  Concerns regarding medicines are outlined above.   Tests Ordered: No orders of the defined types were placed in this encounter.   Medication Changes: No orders of the defined types were placed in this encounter.   Follow Up:  In Person in 6 month(s)  Signed, Mertie Moores, MD  10/13/2018 6:47 PM    [place]

## 2018-10-14 ENCOUNTER — Encounter: Payer: Self-pay | Admitting: Cardiovascular Disease

## 2018-10-14 ENCOUNTER — Other Ambulatory Visit: Payer: Self-pay

## 2018-10-14 ENCOUNTER — Telehealth (INDEPENDENT_AMBULATORY_CARE_PROVIDER_SITE_OTHER): Payer: 59 | Admitting: Cardiovascular Disease

## 2018-10-14 VITALS — BP 121/80 | HR 85 | Ht 70.0 in | Wt 202.0 lb

## 2018-10-14 DIAGNOSIS — Z7189 Other specified counseling: Secondary | ICD-10-CM

## 2018-10-14 DIAGNOSIS — Z148 Genetic carrier of other disease: Secondary | ICD-10-CM | POA: Diagnosis not present

## 2018-10-14 DIAGNOSIS — E782 Mixed hyperlipidemia: Secondary | ICD-10-CM | POA: Diagnosis not present

## 2018-10-14 NOTE — Patient Instructions (Signed)
Medication Instructions:   Your physician recommends that you continue on your current medications as directed. Please refer to the Current Medication list given to you today.  If you need a refill on your cardiac medications before your next appointment, please call your pharmacy.   Lab work:  Your physician recommends that you return for lab work in: 3 months on 01/14/19 at [facility] for BMET, lipids, and liver functions.  If you have labs (blood work) drawn today and your tests are completely normal, you will receive your results only by: Marland Kitchen MyChart Message (if you have MyChart) OR . A paper copy in the mail If you have any lab test that is abnormal or we need to change your treatment, we will call you to review the results.  Testing/Procedures:  None ordered today  Follow-Up: At [facility], you and your health needs are our priority.  As part of our continuing mission to provide you with exceptional heart care, we have created designated Provider Care Teams.  These Care Teams include your primary Cardiologist (physician) and Advanced Practice Providers (APPs -  Physician Assistants and Nurse Practitioners) who all work together to provide you with the care you need, when you need it. You will need a follow up appointment in:  6 months.  Please call our office 2 months in advance to schedule this appointment.  You may see Mertie Moores, MD or one of the following Advanced Practice Providers on your designated Care Team: Richardson Dopp, PA-C [place], [place] . Daune Perch, NP

## 2018-10-29 ENCOUNTER — Ambulatory Visit (INDEPENDENT_AMBULATORY_CARE_PROVIDER_SITE_OTHER)
Admission: RE | Admit: 2018-10-29 | Discharge: 2018-10-29 | Disposition: A | Discharge status: Home / Self Care | Payer: 59 | Source: Ambulatory Visit | Attending: Family Medicine | Admitting: Family Medicine

## 2018-10-29 ENCOUNTER — Ambulatory Visit: Payer: 59 | Admitting: Family Medicine

## 2018-10-29 ENCOUNTER — Encounter: Payer: Self-pay | Admitting: Family Medicine

## 2018-10-29 ENCOUNTER — Other Ambulatory Visit: Payer: Self-pay

## 2018-10-29 VITALS — BP 122/70 | HR 78 | Temp 98.5°F | Ht 70.0 in | Wt 207.1 lb

## 2018-10-29 DIAGNOSIS — T883XXS Malignant hyperthermia due to anesthesia, sequela: Secondary | ICD-10-CM

## 2018-10-29 DIAGNOSIS — M79671 Pain in right foot: Secondary | ICD-10-CM

## 2018-10-29 DIAGNOSIS — T883XXA Malignant hyperthermia due to anesthesia, initial encounter: Secondary | ICD-10-CM | POA: Insufficient documentation

## 2018-10-29 NOTE — Progress Notes (Signed)
Was painting a ceiling yesterday and didn't fall but hit R foot when she stepped the wrong way.  Ho plantar fasciitis.  Bruised near R 1st toe, distal to the 1st MTP.  She has hyperflexibility at baseline.   She isn't pregnant, husband had vasectomy.    Meds, vitals, and allergies reviewed.   ROS: Per HPI unless specifically indicated in ROS section   nad ncat Able to bear weight but pain with weightbearing on the right foot. Intact dorsalis pedis pulse.  Medial and lateral malleolar line not tender on the right foot.  Achilles not tender. Midfoot not tender but she has some bruising and tenderness distal to the right first MTP.  Distally neurovascular intact.  X-ray without fracture seen.  She felt some better in a hard soled postop shoe.

## 2018-10-29 NOTE — Patient Instructions (Addendum)
Use a hard soled use as needed.  Ice for 5 minutes at a time as needed.  Update [place] as needed.   Take care.  Glad to see you.

## 2018-11-01 DIAGNOSIS — M79671 Pain in right foot: Secondary | ICD-10-CM

## 2018-11-01 HISTORY — DX: Pain in right foot: M79.671

## 2018-11-01 NOTE — Assessment & Plan Note (Signed)
No evidence of fracture though we cannot definitively exclude an occult fracture at this point.  Discussed options.  Imaging discussed with patient.   Use a hard soled use as needed.  Ice for 5 minutes at a time as needed.  Update [place] as needed.  If not better we can reimage.  She agrees.

## 2018-11-30 ENCOUNTER — Ambulatory Visit (INDEPENDENT_AMBULATORY_CARE_PROVIDER_SITE_OTHER): Payer: 59

## 2018-11-30 DIAGNOSIS — Z23 Encounter for immunization: Secondary | ICD-10-CM

## 2018-12-24 ENCOUNTER — Other Ambulatory Visit: Payer: Self-pay | Admitting: Obstetrics

## 2018-12-24 DIAGNOSIS — Z1231 Encounter for screening mammogram for malignant neoplasm of breast: Secondary | ICD-10-CM

## 2018-12-28 ENCOUNTER — Other Ambulatory Visit: Payer: Self-pay | Admitting: Endocrinology

## 2018-12-28 DIAGNOSIS — E049 Nontoxic goiter, unspecified: Secondary | ICD-10-CM

## 2019-01-06 ENCOUNTER — Ambulatory Visit
Admission: RE | Admit: 2019-01-06 | Discharge: 2019-01-06 | Disposition: A | Discharge status: Home / Self Care | Payer: 59 | Source: Ambulatory Visit | Attending: Endocrinology | Admitting: Endocrinology

## 2019-01-06 DIAGNOSIS — E049 Nontoxic goiter, unspecified: Secondary | ICD-10-CM

## 2019-01-14 ENCOUNTER — Other Ambulatory Visit: Payer: 59 | Admitting: *Deleted

## 2019-01-14 ENCOUNTER — Other Ambulatory Visit: Payer: Self-pay

## 2019-01-14 DIAGNOSIS — E782 Mixed hyperlipidemia: Secondary | ICD-10-CM

## 2019-01-14 LAB — BASIC METABOLIC PANEL
BUN/Creatinine Ratio: 11 (ref 9–23)
BUN: 8 mg/dL (ref 6–24)
CO2: 23 mmol/L (ref 20–29)
Calcium: 9.2 mg/dL (ref 8.7–10.2)
Chloride: 102 mmol/L (ref 96–106)
Creatinine, Ser: 0.74 mg/dL (ref 0.57–1.00)
GFR calc Af Amer: 116 mL/min/{1.73_m2} (ref >59)
GFR calc non Af Amer: 100 mL/min/{1.73_m2} (ref >59)
Glucose: 93 mg/dL (ref 65–99)
Potassium: 4.2 mmol/L (ref 3.5–5.2)
Sodium: 138 mmol/L (ref 134–144)

## 2019-01-14 LAB — LIPID PANEL
Chol/HDL Ratio: 4 ratio (ref 0.0–4.4)
Cholesterol, Total: 199 mg/dL (ref 100–199)
HDL: 50 mg/dL (ref >39)
LDL Chol Calc (NIH): 126 mg/dL — ABNORMAL HIGH (ref 0–99)
Triglycerides: 129 mg/dL (ref 0–149)
VLDL Cholesterol Cal: 23 mg/dL (ref 5–40)

## 2019-01-14 LAB — HEPATIC FUNCTION PANEL
ALT: 10 IU/L (ref 0–32)
AST: 14 IU/L (ref 0–40)
Albumin: 4.6 g/dL (ref 3.8–4.8)
Alkaline Phosphatase: 76 IU/L (ref 39–117)
Bilirubin Total: 0.9 mg/dL (ref 0.0–1.2)
Bilirubin, Direct: 0.2 mg/dL (ref 0.00–0.40)
Total Protein: 6.5 g/dL (ref 6.0–8.5)

## 2019-01-17 ENCOUNTER — Other Ambulatory Visit: Payer: Self-pay | Admitting: Nurse Practitioner

## 2019-01-17 DIAGNOSIS — R011 Cardiac murmur, unspecified: Secondary | ICD-10-CM

## 2019-02-05 IMAGING — US US THYROID
1 series · 13 of 25 positions shown · non-contrast
Comparison: None.

CLINICAL DATA: Goiter.

EXAM:
THYROID ULTRASOUND
TECHNIQUE: Ultrasound examination of the thyroid gland and adjacent soft
tissues was performed.

[Series 1: us thyroid · 0.07mm/px · 13 of 48 slices shown]
[im 1/48]
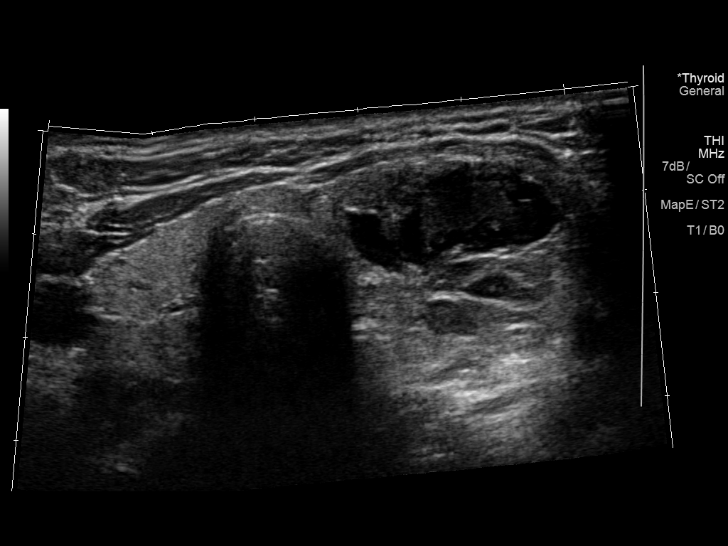
[im 4/48]
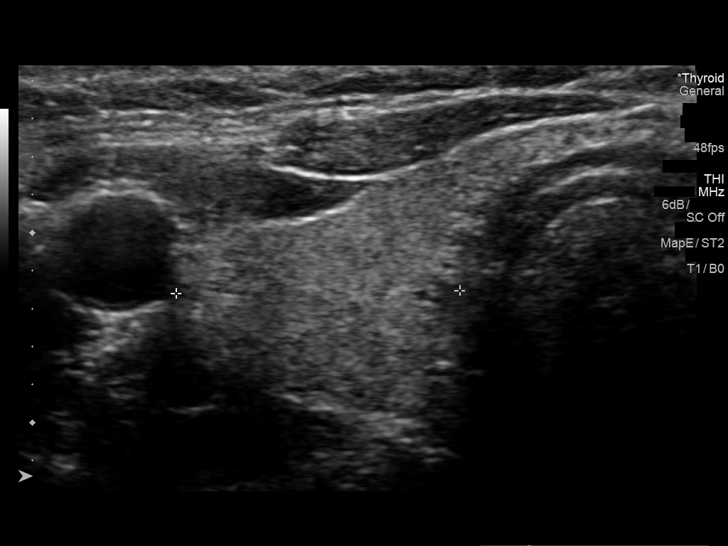
[im 8/48]
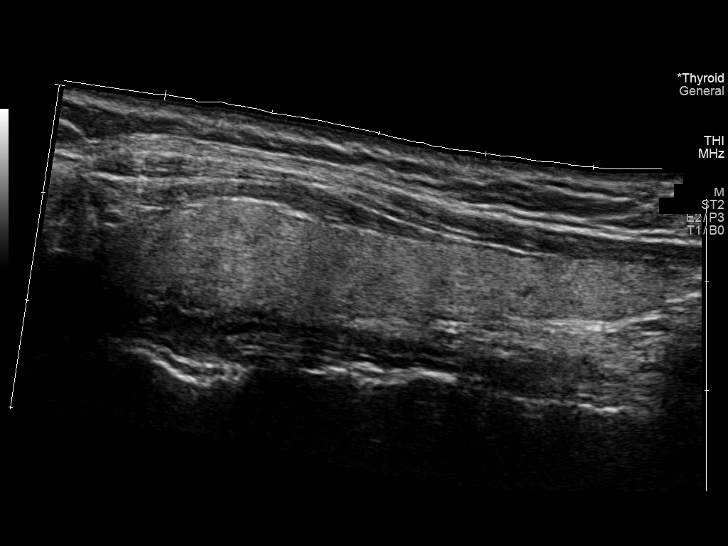
[im 12/48]
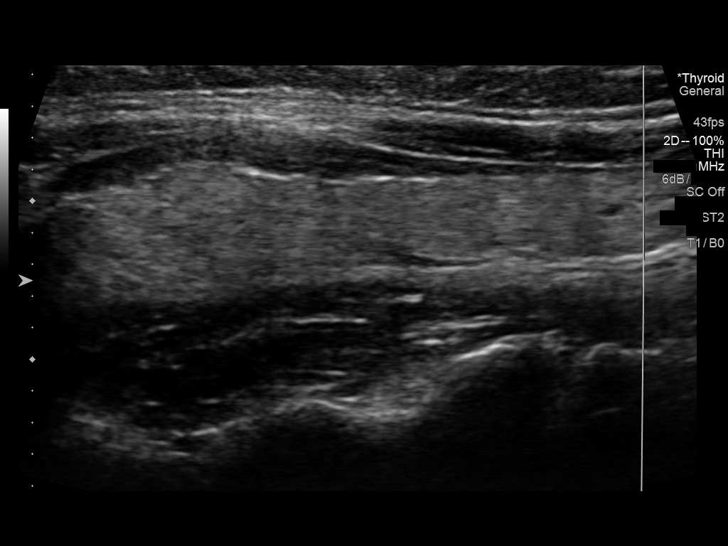
[im 16/48]
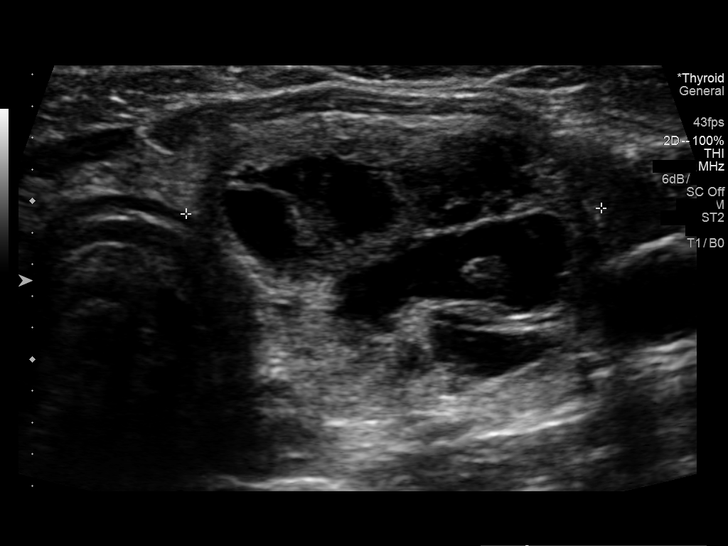
[im 20/48]
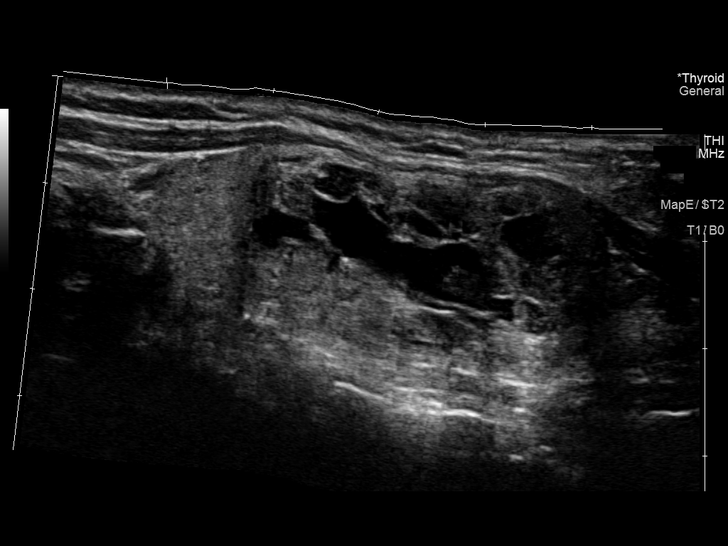
[im 24/48]
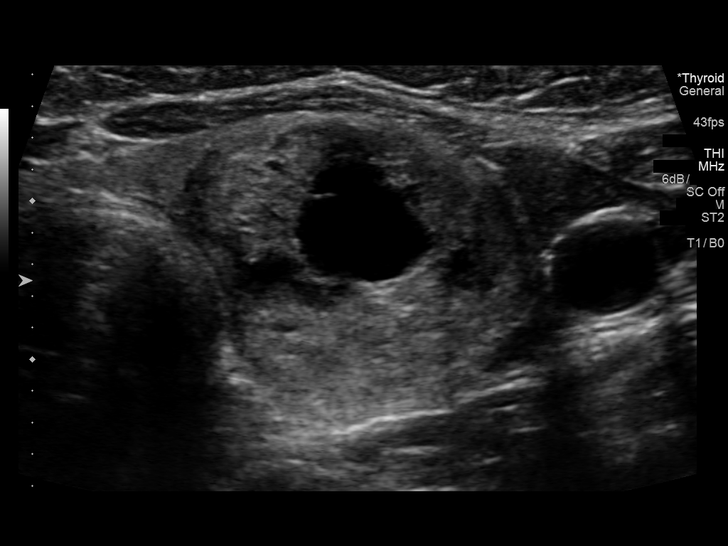
[im 28/48]
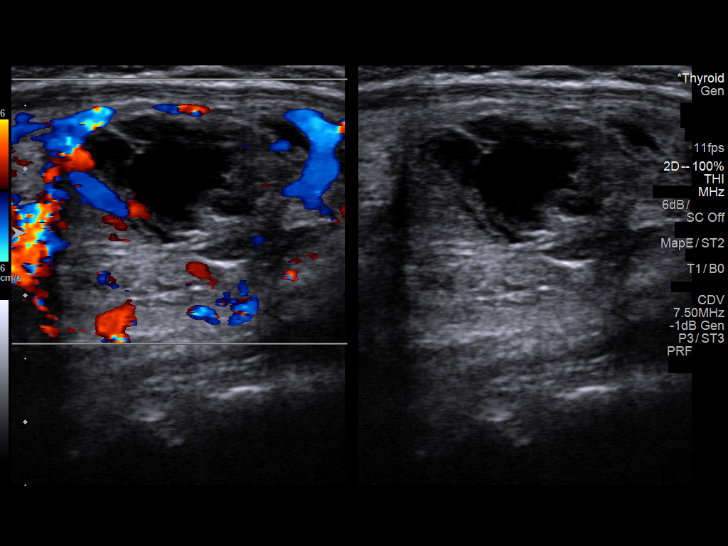
[im 32/48]
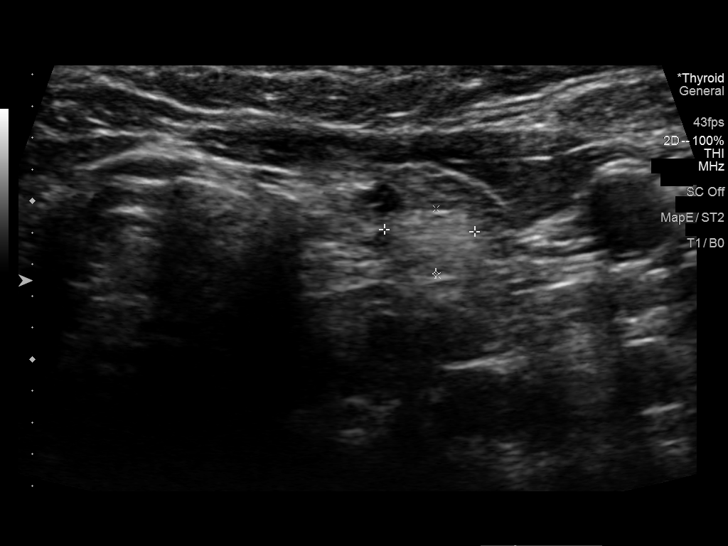
[im 36/48]
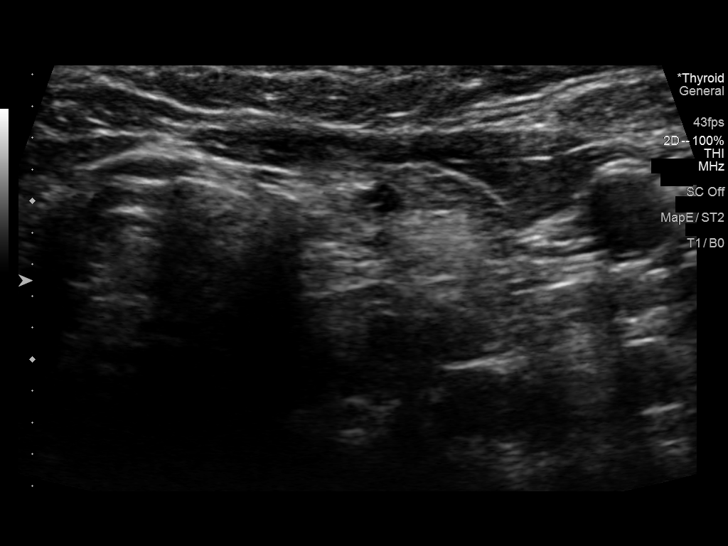
[im 40/48]
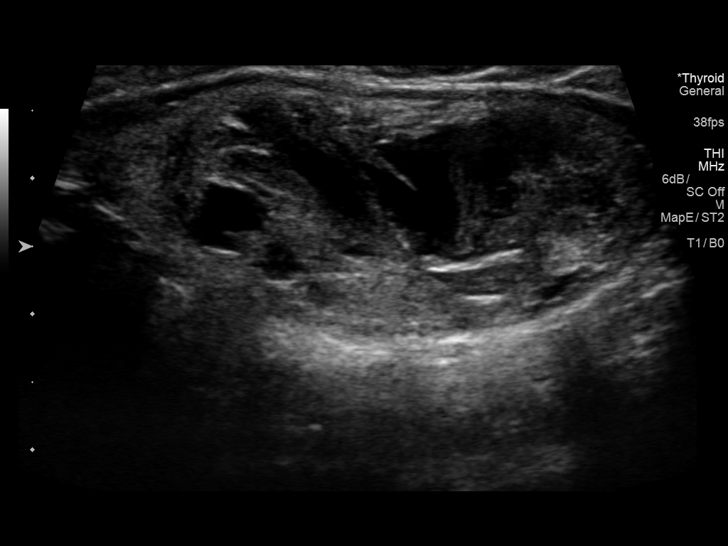
[im 44/48]
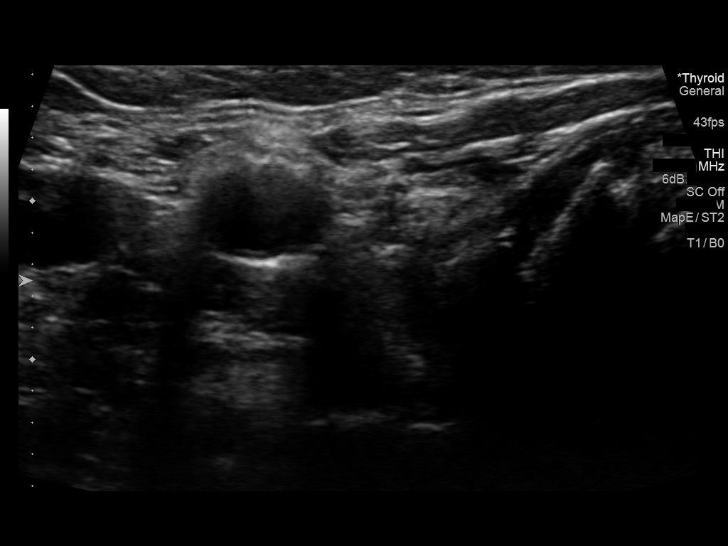
[im 48/48]
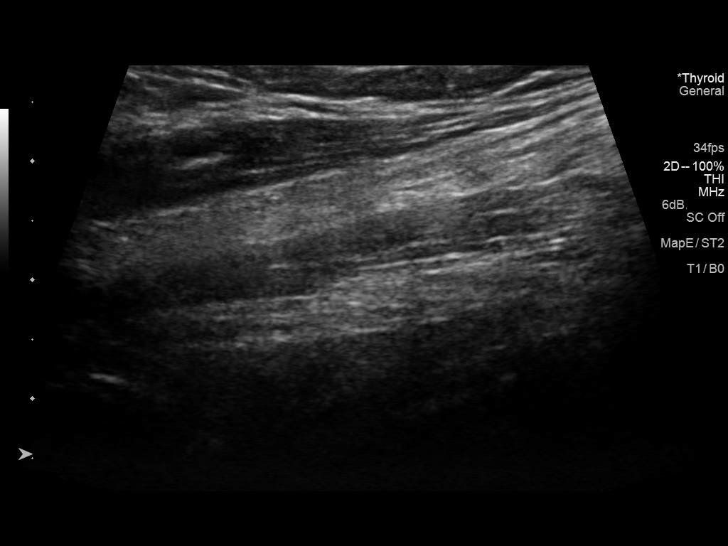

[13 of 25 positions shown; findings below may reference images not displayed]

FINDINGS: Parenchymal Echotexture: Normal

Isthmus: 0.3 cm

Right lobe: 4.9 x 1.1 x 1.5 cm

Left lobe: 5.3 x 2.0 x 2.6 cm

_________________________________________________________

Estimated total number of nodules >/= 1 cm: 1

Number of spongiform nodules >/=  2 cm not described below (TR1): 0

Number of mixed cystic and solid nodules >/= 1.5 cm not described
below (TR2): 0

_________________________________________________________

Nodule # 1:

Location: Left; Inferior

Maximum size: 3.7 cm; Other 2 dimensions: 2.5 x 2.0 cm

Composition: mixed cystic and solid (1)

Echogenicity: hypoechoic (2)

Shape: not taller-than-wide (0)

Margins: smooth (0)

Echogenic foci: none (0)

ACR TI-RADS total points: 3.

ACR TI-RADS risk category: TR3 (3 points).

ACR TI-RADS recommendations:

**Given size (>/= 2.5 cm) and appearance, fine needle aspiration of
this mildly suspicious nodule should be considered based on TI-RADS
criteria.

_________________________________________________________

Hyperechoic left lower pole nodule measures 0.7 cm and does not meet
criteria for biopsy nor follow-up.
IMPRESSION: Complex left lower nodule 1 meets criteria for fine needle
aspiration biopsy.

The above is in keeping with the ACR TI-RADS recommendations - [HOSPITAL] 5488;[DATE].

## 2019-02-08 ENCOUNTER — Ambulatory Visit
Admission: RE | Admit: 2019-02-08 | Discharge: 2019-02-08 | Disposition: A | Discharge status: Home / Self Care | Payer: 59 | Source: Ambulatory Visit | Attending: Obstetrics | Admitting: Obstetrics

## 2019-02-08 ENCOUNTER — Other Ambulatory Visit: Payer: Self-pay

## 2019-02-08 DIAGNOSIS — Z1231 Encounter for screening mammogram for malignant neoplasm of breast: Secondary | ICD-10-CM

## 2019-02-08 LAB — HM MAMMOGRAPHY

## 2019-04-05 ENCOUNTER — Encounter (INDEPENDENT_AMBULATORY_CARE_PROVIDER_SITE_OTHER): Payer: Self-pay

## 2019-04-05 ENCOUNTER — Ambulatory Visit (HOSPITAL_COMMUNITY): Payer: 59 | Attending: Cardiology

## 2019-04-05 ENCOUNTER — Other Ambulatory Visit: Payer: Self-pay

## 2019-04-05 DIAGNOSIS — R011 Cardiac murmur, unspecified: Secondary | ICD-10-CM

## 2019-04-07 ENCOUNTER — Other Ambulatory Visit: Payer: Self-pay

## 2019-04-07 ENCOUNTER — Encounter: Payer: Self-pay | Admitting: Cardiovascular Disease

## 2019-04-07 ENCOUNTER — Ambulatory Visit: Payer: 59 | Admitting: Cardiovascular Disease

## 2019-04-07 VITALS — BP 120/80 | HR 85 | Ht 71.0 in | Wt 205.0 lb

## 2019-04-07 DIAGNOSIS — E782 Mixed hyperlipidemia: Secondary | ICD-10-CM | POA: Diagnosis not present

## 2019-04-07 DIAGNOSIS — I059 Rheumatic mitral valve disease, unspecified: Secondary | ICD-10-CM | POA: Diagnosis not present

## 2019-04-07 DIAGNOSIS — R011 Cardiac murmur, unspecified: Secondary | ICD-10-CM

## 2019-04-07 NOTE — Patient Instructions (Signed)
Medication Instructions:  Your physician recommends that you continue on your current medications as directed. Please refer to the Current Medication list given to you today.  *If you need a refill on your cardiac medications before your next appointment, please call your pharmacy*  Lab Work: Your physician recommends that you return for lab work in: 6 months on the day of or a few days before your office visit.  You will need to FAST for this appointment - nothing to eat or drink after midnight the night before except water.  If you have labs (blood work) drawn today and your tests are completely normal, you will receive your results only by: Marland Kitchen MyChart Message (if you have MyChart) OR . A paper copy in the mail If you have any lab test that is abnormal or we need to change your treatment, we will call you to review the results.   Testing/Procedures: None Ordered   Follow-Up: At [facility], you and your health needs are our priority.  As part of our continuing mission to provide you with exceptional heart care, we have created designated Provider Care Teams.  These Care Teams include your primary Cardiologist (physician) and Advanced Practice Providers (APPs -  Physician Assistants and Nurse Practitioners) who all work together to provide you with the care you need, when you need it.  Your next appointment:   6 month(s)  The format for your next appointment:   Either In Person or Virtual  Provider:   You may see Mertie Moores, MD or one of the following Advanced Practice Providers on your designated Care Team:    Richardson Dopp, PA-C  [place], [place]  Daune Perch, [place]

## 2019-04-07 NOTE — Progress Notes (Signed)
Cardiology Office Note:    Date:  04/07/2019   ID:  Elaine [name], DOB 1976-02-05, MRN [id number]  PCP:  Pleas Koch, NP  Cardiologist:  Mertie Moores, MD  Electrophysiologist:  None   Referring MD: Pleas Koch, NP   Chief Complaint  Patient presents with  . Hyperlipidemia     Jan. 6, 2020   Elaine [name] is a 44 y.o. female with a strong family history of premature coronary artery disease.  We are asked to see her today for further evaluation by Pleas Koch, NP   She is seen with her husband, Thora Lance and father had significant CAD - ( smoked heavily)   She also has episodes of tachycardia  She had an episode of sepsis related to diverticulitis.   She just started exercising. 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   They farm,   ( chickens , ducks, turkeys, gardens ) Has gained 20 lbs this past year -     Started exercising this past Nov.    Walks, has alpha 1 antitripsin deficiency - is not on inhalers  Sees pulmonary   Non smoker   Has leg swelling in the evening    Jan. 7, 2021 Elaine [name] is seen for follow up of her hyperlipidemia .  She has a strong family history of coronary artery disease.  She has alpha-1 antitrypsin disorder and has not been on a statin medication. Her coronary calcium score is no (Jan. 6, 2020)   Has bilateral shoulder pain she relates to starting Zetia . Has been taking black seed oil with some relief.   Also had some diarrhea - has resolved.  .  Walks 30-45 minutes  A day .       Past Medical History:  Diagnosis Date  . Alpha-1-antitrypsin deficiency ([place])   . Diverticulitis    with sepsis  . Diverticulosis   . Fibroids    Vaginal  . GERD (gastroesophageal reflux disease)   . 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 Hyperlipidemia   . Hypothyroidism   . Malignant hyperthermia   . Psoriasis   . Ruptured ovarian cyst  2004,2016  . Seizures ([place])     Past Surgical History:  Procedure Laterality Date  . DENTAL SURGERY    . LAPAROSCOPIC SIGMOID COLECTOMY  2013    Current Medications: Current Meds  Medication Sig  . BLACK CURRANT SEED OIL PO Take 1,000 mg by mouth daily.  Marland Kitchen ezetimibe (ZETIA) 10 MG tablet Take 1 tablet (10 mg total) by mouth daily.  . fluticasone (FLONASE) 50 MCG/ACT nasal spray Place 2 sprays into both nostrils daily.  Marland Kitchen MAGNESIUM PO Take 1,250 mg by mouth daily.  . Melatonin 5 MG TABS Take 15 mg by mouth.  . Multiple Vitamins-Minerals (MULTI-VITAMIN GUMMIES PO) Take 6 each by mouth daily.  . Nutritional Supplements (VITAMIN D BOOSTER PO) Take 400 Units by mouth daily.  Marland Kitchen ZOLMitriptan (ZOMIG) 2.5 MG tablet Take 1 tablet by mouth at migraine onset. May repeat in 2 hours if headache persists or recurs.     Allergies:   Anesthetics, amide; Cephalexin; Dilaudid  [hydromorphone hcl]; and Penicillins   Social History   Socioeconomic History  . Marital status: Married    Spouse name: Not on file  . Number of children: Not on file  . Years of education: Not on file  .  Highest education level: Not on file  Occupational History  . Not on file  Tobacco Use  . Smoking status: Former Research scientist (life sciences)  . Smokeless tobacco: Never Used  Substance and Sexual Activity  . Alcohol use: Yes  . Drug use: Not on file  . Sexual activity: Not on file  Other Topics Concern  . Not on file  Social History Narrative   Married.   1 child.   Works as a Psychologist, sport and exercise.    Social Determinants of Health   Financial Resource Strain:   . Difficulty of Paying Living Expenses: Not on file  Food Insecurity:   . Worried About Charity fundraiser in the Last Year: Not on file  . Ran Out of Food in the Last Year: Not on file  Transportation Needs:   . Lack of Transportation (Medical): Not on file  . Lack of Transportation (Non-Medical): Not on file  Physical Activity:   . Days of Exercise per Week: Not on file  .  Minutes of Exercise per Session: Not on file  Stress:   . Feeling of Stress : Not on file  Social Connections:   . Frequency of Communication with Friends and Family: Not on file  . Frequency of Social Gatherings with Friends and Family: Not on file  . Attends Religious Services: Not on file  . Active Member of Clubs or Organizations: Not on file  . Attends Archivist Meetings: Not on file  . Marital Status: Not on file     Family History: The patient's family history includes Heart attack (age of onset: 80) in her father; Hyperlipidemia in her father; Stroke (age of onset: 49) in her father.  ROS:   Please see the history of present illness.     All other systems reviewed and are negative.  EKGs/Labs/Other Studies Reviewed:      EKG:    Recent Labs: 01/14/2019: ALT 10; BUN 8; Creatinine, Ser 0.74; Potassium 4.2; Sodium 138  Recent Lipid Panel    Component Value Date/Time   CHOL 199 01/14/2019 0841   TRIG 129 01/14/2019 0841   HDL 50 01/14/2019 0841   CHOLHDL 4.0 01/14/2019 0841   CHOLHDL 4 02/04/2018 1104   VLDL 26.8 02/04/2018 1104   LDLCALC 126 (H) 01/14/2019 0841    Physical Exam:     Physical Exam: Blood pressure 120/80, pulse 85, height 5\' 11"  (1.803 m), weight 205 lb (93 kg), SpO2 98 %.  GEN:  Well nourished, well developed in no acute distress HEENT: Normal NECK: No JVD; No carotid bruits LYMPHATICS: No lymphadenopathy CARDIAC: RRR , no murmurs, rubs, gallops RESPIRATORY:  Clear to auscultation without rales, wheezing or rhonchi  ABDOMEN: Soft, non-tender, non-distended MUSCULOSKELETAL:  No edema; No deformity  SKIN: Warm and dry NEUROLOGIC:  Alert and oriented x 3   ASSESSMENT:    1. Mixed hyperlipidemia   2. Murmur, cardiac   3. Mitral valve disorder    PLAN:    In order of problems listed above:  1.  family hx of CAD :   I encouraged her to continue with a good diet, exercise, weight loss program.  She is lost a little bit of  weight in the past but regained it over Christmas.    2.  Alpha 1 antitripsin deficiency .  MZ varient . .   3.  Hyperlipidemia: Her LDL is 138. We have redrawn labs today.  Medication Adjustments/Labs and Tests Ordered: Current medicines are reviewed at length with the  patient today.  Concerns regarding medicines are outlined above.  Orders Placed This Encounter  Procedures  . Lipid Profile  . Basic Metabolic Panel (BMET)  . Hepatic function panel   No orders of the defined types were placed in this encounter.   Patient Instructions  Medication Instructions:  Your physician recommends that you continue on your current medications as directed. Please refer to the Current Medication list given to you today.  *If you need a refill on your cardiac medications before your next appointment, please call your pharmacy*  Lab Work: Your physician recommends that you return for lab work in: 6 months on the day of or a few days before your office visit.  You will need to FAST for this appointment - nothing to eat or drink after midnight the night before except water.  If you have labs (blood work) drawn today and your tests are completely normal, you will receive your results only by: Marland Kitchen MyChart Message (if you have MyChart) OR . A paper copy in the mail If you have any lab test that is abnormal or we need to change your treatment, we will call you to review the results.   Testing/Procedures: None Ordered   Follow-Up: At [facility], you and your health needs are our priority.  As part of our continuing mission to provide you with exceptional heart care, we have created designated Provider Care Teams.  These Care Teams include your primary Cardiologist (physician) and Advanced Practice Providers (APPs -  Physician Assistants and Nurse Practitioners) who all work together to provide you with the care you need, when you need it.  Your next appointment:   6 month(s)  The format for  your next appointment:   Either In Person or Virtual  Provider:   You may see Mertie Moores, MD or one of the following Advanced Practice Providers on your designated Care Team:    Richardson Dopp, PA-C  [place], [place]  Daune Perch, [place]       Signed, Mertie Moores, MD  04/07/2019 6:04 PM    [place]

## 2019-07-21 ENCOUNTER — Telehealth: Payer: Self-pay

## 2019-07-21 DIAGNOSIS — L409 Psoriasis, unspecified: Secondary | ICD-10-CM

## 2019-07-21 MED ORDER — MOMETASONE FUROATE 0.1 % EX CREA: 1.0000 "application " | TOPICAL_CREAM | Freq: Every day | CUTANEOUS | 0 refills | Status: DC

## 2019-07-21 NOTE — Telephone Encounter (Signed)
Please notify patient that I am happy to refill her cream, but we do need to see her for annual follow-up or physical soon.  Please schedule.

## 2019-07-21 NOTE — Telephone Encounter (Signed)
Pt left v/m that she has had an outbreak of psoriasis due to increased stress level. Pt has not had a problem with psoriasis for a while and pt request refill mometasone to [facility]. Pt last annual 02/03/2018; last acute 10/29/18 and no future appt scheduled. Last refilled mometasone 0.1% cream # 45 gm X 1 on 11/26/2016.Please advise.

## 2019-07-22 ENCOUNTER — Other Ambulatory Visit: Payer: Self-pay | Admitting: Primary Care

## 2019-08-02 ENCOUNTER — Other Ambulatory Visit: Payer: 59

## 2019-08-09 ENCOUNTER — Ambulatory Visit (INDEPENDENT_AMBULATORY_CARE_PROVIDER_SITE_OTHER): Payer: 59 | Admitting: Primary Care

## 2019-08-09 ENCOUNTER — Encounter: Payer: Self-pay | Admitting: Primary Care

## 2019-08-09 ENCOUNTER — Other Ambulatory Visit: Payer: Self-pay

## 2019-08-09 VITALS — BP 122/76 | HR 88 | Temp 96.0°F | Ht 71.0 in | Wt 205.5 lb

## 2019-08-09 DIAGNOSIS — L409 Psoriasis, unspecified: Secondary | ICD-10-CM | POA: Diagnosis not present

## 2019-08-09 DIAGNOSIS — E041 Nontoxic single thyroid nodule: Secondary | ICD-10-CM | POA: Diagnosis not present

## 2019-08-09 DIAGNOSIS — E785 Hyperlipidemia, unspecified: Secondary | ICD-10-CM | POA: Diagnosis not present

## 2019-08-09 DIAGNOSIS — G43109 Migraine with aura, not intractable, without status migrainosus: Secondary | ICD-10-CM

## 2019-08-09 DIAGNOSIS — Z0000 Encounter for general adult medical examination without abnormal findings: Secondary | ICD-10-CM

## 2019-08-09 DIAGNOSIS — R41840 Attention and concentration deficit: Secondary | ICD-10-CM | POA: Insufficient documentation

## 2019-08-09 DIAGNOSIS — E538 Deficiency of other specified B group vitamins: Secondary | ICD-10-CM | POA: Diagnosis not present

## 2019-08-09 DIAGNOSIS — K219 Gastro-esophageal reflux disease without esophagitis: Secondary | ICD-10-CM

## 2019-08-09 DIAGNOSIS — E039 Hypothyroidism, unspecified: Secondary | ICD-10-CM

## 2019-08-09 LAB — COMPREHENSIVE METABOLIC PANEL
ALT: 14 U/L (ref 0–35)
AST: 17 U/L (ref 0–37)
Albumin: 4.5 g/dL (ref 3.5–5.2)
Alkaline Phosphatase: 61 U/L (ref 39–117)
BUN: 12 mg/dL (ref 6–23)
CO2: 27 mEq/L (ref 19–32)
Calcium: 9.4 mg/dL (ref 8.4–10.5)
Chloride: 102 mEq/L (ref 96–112)
Creatinine, Ser: 0.79 mg/dL (ref 0.40–1.20)
GFR: 79.5 mL/min (ref >60.00)
Glucose, Bld: 99 mg/dL (ref 70–99)
Potassium: 4.3 mEq/L (ref 3.5–5.1)
Sodium: 135 mEq/L (ref 135–145)
Total Bilirubin: 0.9 mg/dL (ref 0.2–1.2)
Total Protein: 7.1 g/dL (ref 6.0–8.3)

## 2019-08-09 LAB — LIPID PANEL
Cholesterol: 239 mg/dL — ABNORMAL HIGH (ref 0–200)
HDL: 46 mg/dL (ref >39.00)
NonHDL: 193.29
Total CHOL/HDL Ratio: 5
Triglycerides: 214 mg/dL — ABNORMAL HIGH (ref 0.0–149.0)
VLDL: 42.8 mg/dL — ABNORMAL HIGH (ref 0.0–40.0)

## 2019-08-09 LAB — CBC
HCT: 39.3 % (ref 36.0–46.0)
Hemoglobin: 13.2 g/dL (ref 12.0–15.0)
MCHC: 33.5 g/dL (ref 30.0–36.0)
MCV: 90.3 fl (ref 78.0–100.0)
Platelets: 322 10*3/uL (ref 150.0–400.0)
RBC: 4.35 Mil/uL (ref 3.87–5.11)
RDW: 13.9 % (ref 11.5–15.5)
WBC: 9.3 10*3/uL (ref 4.0–10.5)

## 2019-08-09 LAB — VITAMIN B12: Vitamin B-12: 681 pg/mL (ref 211–911)

## 2019-08-09 LAB — LDL CHOLESTEROL, DIRECT: Direct LDL: 156 mg/dL

## 2019-08-09 LAB — TSH: TSH: 1.92 u[IU]/mL (ref 0.35–4.50)

## 2019-08-09 MED ORDER — ZOLMITRIPTAN 2.5 MG PO TABS: ORAL_TABLET | ORAL | 0 refills | Status: DC

## 2019-08-09 MED ORDER — MOMETASONE FUROATE 0.1 % EX CREA: 1.0000 "application " | TOPICAL_CREAM | Freq: Every day | CUTANEOUS | 0 refills | Status: DC

## 2019-08-09 NOTE — Assessment & Plan Note (Signed)
Intermittent, refill provided for mometasone cream.

## 2019-08-09 NOTE — Assessment & Plan Note (Signed)
Not currently on treatment, just a multi-vitamin. Repeat labs pending.

## 2019-08-09 NOTE — Assessment & Plan Note (Signed)
Diagnosed with ADHD in childhood, no prior treatment. Referral placed to psychology for formal testing. Will await results.

## 2019-08-09 NOTE — Assessment & Plan Note (Signed)
Chronic, occurring twice monthly. Overall improved compared to prior years. Refill provided for Zomig for which she uses sparingly.

## 2019-08-09 NOTE — Progress Notes (Signed)
Subjective:    Patient ID: Elaine [name], female    DOB: 1975/04/13, 44 y.o.   MRN: [id number]  HPI  This visit occurred during the SARS-CoV-2 public health emergency.  Safety protocols were in place, including screening questions prior to the visit, additional usage of staff PPE, and extensive cleaning of exam room while observing appropriate contact time as indicated for disinfecting solutions.   Elaine [name] is a 44 year old female who presents today for complete physical.  She would also like to be considered for ADHD treatment. Diagnosed in childhood and has not been tested since. Never treated with medication.   Immunizations: -Tetanus: Completed in 2015 -Influenza: Completed this season  -Covid-19: Completed series   Diet: She endorses a healthy diet.  Exercise: Regular walks  Eye exam: Completed in 2020 Dental exam: Completes semi-annually   Pap Smear: Completed in 2018, follows with GYN. Mammogram: Completed in November 2020  BP Readings from Last 3 Encounters:  08/09/19 122/76  04/07/19 120/80  10/29/18 122/70     Review of Systems  Constitutional: Negative for unexpected weight change.  HENT: Negative for rhinorrhea.   Respiratory: Negative for cough and shortness of breath.   Cardiovascular: Negative for chest pain.  Gastrointestinal: Negative for constipation and diarrhea.       Doing well on magnesium for constipation   Genitourinary: Negative for difficulty urinating.  Musculoskeletal: Negative for arthralgias and myalgias.  Skin: Positive for rash.       Chronic intermittent psoriasis   Allergic/Immunologic: Negative for environmental allergies.  Neurological: Positive for headaches. Negative for dizziness and numbness.       Migraines 1-2 times monthly, improved overall. Doing well with OTC treatment most of the time.  Psychiatric/Behavioral: The patient is not nervous/anxious.        Concerns for ADHD symptoms of mind racing thoughts, easily distracted,  hyper-focus. No recent ADHD testing.        Past Medical History:  Diagnosis Date  . Alpha-1-antitrypsin deficiency ([place])   . Diverticulitis    with sepsis  . Diverticulosis   . Fibroids    Vaginal  . GERD (gastroesophageal reflux disease)   . 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 Hyperlipidemia   . Hypothyroidism   . Malignant hyperthermia   . Psoriasis   . Ruptured ovarian cyst 2004,2016  . Seizures ([place])      Social History   Socioeconomic History  . Marital status: Married    Spouse name: Not on file  . Number of children: Not on file  . Years of education: Not on file  . Highest education level: Not on file  Occupational History  . Not on file  Tobacco Use  . Smoking status: Former Research scientist (life sciences)  . Smokeless tobacco: Never Used  Substance and Sexual Activity  . Alcohol use: Yes  . Drug use: Not on file  . Sexual activity: Not on file  Other Topics Concern  . Not on file  Social History Narrative   Married.   1 child.   Works as a Psychologist, sport and exercise.    Social Determinants of Health   Financial Resource Strain:   . Difficulty of Paying Living Expenses:   Food Insecurity:   . Worried About Charity fundraiser in the Last Year:   . Arboriculturist in the Last Year:   Transportation Needs:   . Film/video editor (Medical):   Marland Kitchen Lack of Transportation (Non-Medical):  Physical Activity:   . Days of Exercise per Week:   . Minutes of Exercise per Session:   Stress:   . Feeling of Stress :   Social Connections:   . Frequency of Communication with Friends and Family:   . Frequency of Social Gatherings with Friends and Family:   . Attends Religious Services:   . Active Member of Clubs or Organizations:   . Attends Archivist Meetings:   Marland Kitchen Marital Status:   Intimate Partner Violence:   . Fear of Current or Ex-Partner:   . Emotionally Abused:   Marland Kitchen Physically Abused:   . Sexually Abused:     Past Surgical History:  Procedure Laterality  Date  . DENTAL SURGERY    . LAPAROSCOPIC SIGMOID COLECTOMY  2013    Family History  Problem Relation Age of Onset  . Stroke Father 5  . Heart attack Father 50  . Hyperlipidemia Father     Allergies  Allergen Reactions  . Anesthetics, Amide Anaphylaxis  . Other Anaphylaxis  . Cephalexin   . Dilaudid  [Hydromorphone Hcl] Nausea And Vomiting  . Penicillins     Current Outpatient Medications on File Prior to Visit  Medication Sig Dispense Refill  . fluticasone (FLONASE) 50 MCG/ACT nasal spray Place 2 sprays into both nostrils daily.    Javier Docker Oil 1000 MG CAPS Take by mouth.    Marland Kitchen MAGNESIUM PO Take 1,250 mg by mouth daily.    . Melatonin 5 MG TABS Take 15 mg by mouth.    . Multiple Vitamin (MULTIVITAMIN ADULT PO) Take by mouth.    . Multiple Vitamins-Minerals (MULTI-VITAMIN GUMMIES PO) Take 6 each by mouth daily.    . Nutritional Supplements (VITAMIN D BOOSTER PO) Take 400 Units by mouth daily.     No current facility-administered medications on file prior to visit.    BP 122/76   Pulse 88   Temp (!) 96 F (35.6 C) (Temporal)   Ht 5\' 11"  (1.803 m)   Wt 205 lb 8 oz (93.2 kg)   LMP 08/03/2019   SpO2 98%   BMI 28.66 kg/m    Objective:   Physical Exam  Constitutional: She is oriented to person, place, and time. She appears well-nourished.  HENT:  Right Ear: Tympanic membrane and ear canal normal.  Left Ear: Tympanic membrane and ear canal normal.  Mouth/Throat: Oropharynx is clear and moist.  Eyes: Pupils are equal, round, and reactive to light. EOM are normal.  Cardiovascular: Normal rate and regular rhythm.  Respiratory: Effort normal and breath sounds normal.  GI: Soft. Bowel sounds are normal. There is no abdominal tenderness.  Musculoskeletal:        General: Normal range of motion.     Cervical back: Neck supple.  Neurological: She is alert and oriented to person, place, and time. No cranial nerve deficit.  Reflex Scores:      Patellar reflexes are 2+ on  the right side and 2+ on the left side. Skin: Skin is warm and dry.  Psychiatric: She has a normal mood and affect.           Assessment & Plan:

## 2019-08-09 NOTE — Assessment & Plan Note (Signed)
Immunizations UTD. Pap smear UTD. Mammogram UTD. Encouraged a healthy diet and regular exercise. Exam today unremarkable. Labs pending.

## 2019-08-09 NOTE — Assessment & Plan Note (Signed)
Encouraged regular exercise, healthy diet. Labs pending.

## 2019-08-09 NOTE — Patient Instructions (Addendum)
Stop by the lab prior to leaving today. I will notify you of your results once received.   Continue exercising. You should be getting 150 minutes of moderate intensity exercise weekly.  Continue to work on a healthy diet. Ensure you are consuming 64 ounces of water daily.  You will be contacted regarding your referral for ADHD testing.  Please let us know if you have not been contacted within two weeks.   It was a pleasure to see you today!   Preventive Care 75-65 Years Old, Female Preventive care refers to visits with your health care provider and lifestyle choices that can promote health and wellness. This includes:  A yearly physical exam. This may also be called an annual well check.  Regular dental visits and eye exams.  Immunizations.  Screening for certain conditions.  Healthy lifestyle choices, such as eating a healthy diet, getting regular exercise, not using drugs or products that contain nicotine and tobacco, and limiting alcohol use. What can I expect for my preventive care visit? Physical exam Your health care provider will check your:  Height and weight. This may be used to calculate body mass index (BMI), which tells if you are at a healthy weight.  Heart rate and blood pressure.  Skin for abnormal spots. Counseling Your health care provider may ask you questions about your:  Alcohol, tobacco, and drug use.  Emotional well-being.  Home and relationship well-being.  Sexual activity.  Eating habits.  Work and work Statistician.  Method of birth control.  Menstrual cycle.  Pregnancy history. What immunizations do I need?  Influenza (flu) vaccine  This is recommended every year. Tetanus, diphtheria, and pertussis (Tdap) vaccine  You may need a Td booster every 10 years. Varicella (chickenpox) vaccine  You may need this if you have not been vaccinated. Zoster (shingles) vaccine  You may need this after age 70. Measles, mumps, and rubella  (MMR) vaccine  You may need at least one dose of MMR if you were born in 1957 or later. You may also need a second dose. Pneumococcal conjugate (PCV13) vaccine  You may need this if you have certain conditions and were not previously vaccinated. Pneumococcal polysaccharide (PPSV23) vaccine  You may need one or two doses if you smoke cigarettes or if you have certain conditions. Meningococcal conjugate (MenACWY) vaccine  You may need this if you have certain conditions. Hepatitis A vaccine  You may need this if you have certain conditions or if you travel or work in places where you may be exposed to hepatitis A. Hepatitis B vaccine  You may need this if you have certain conditions or if you travel or work in places where you may be exposed to hepatitis B. Haemophilus influenzae type b (Hib) vaccine  You may need this if you have certain conditions. Human papillomavirus (HPV) vaccine  If recommended by your health care provider, you may need three doses over 6 months. You may receive vaccines as individual doses or as more than one vaccine together in one shot (combination vaccines). Talk with your health care provider about the risks and benefits of combination vaccines. What tests do I need? Blood tests  Lipid and cholesterol levels. These may be checked every 5 years, or more frequently if you are over 16 years old.  Hepatitis C test.  Hepatitis B test. Screening  Lung cancer screening. You may have this screening every year starting at age 58 if you have a 30-pack-year history of smoking and currently  smoke or have quit within the past 15 years.  Colorectal cancer screening. All adults should have this screening starting at age 49 and continuing until age 75. Your health care provider may recommend screening at age 18 if you are at increased risk. You will have tests every 1-10 years, depending on your results and the type of screening test.  Diabetes screening. This is done  by checking your blood sugar (glucose) after you have not eaten for a while (fasting). You may have this done every 1-3 years.  Mammogram. This may be done every 1-2 years. Talk with your health care provider about when you should start having regular mammograms. This may depend on whether you have a family history of breast cancer.  BRCA-related cancer screening. This may be done if you have a family history of breast, ovarian, tubal, or peritoneal cancers.  Pelvic exam and Pap test. This may be done every 3 years starting at age 9. Starting at age 8, this may be done every 5 years if you have a Pap test in combination with an HPV test. Other tests  Sexually transmitted disease (STD) testing.  Bone density scan. This is done to screen for osteoporosis. You may have this scan if you are at high risk for osteoporosis. Follow these instructions at home: Eating and drinking  Eat a diet that includes fresh fruits and vegetables, whole grains, lean protein, and low-fat dairy.  Take vitamin and mineral supplements as recommended by your health care provider.  Do not drink alcohol if: ? Your health care provider tells you not to drink. ? You are pregnant, may be pregnant, or are planning to become pregnant.  If you drink alcohol: ? Limit how much you have to 0-1 drink a day. ? Be aware of how much alcohol is in your drink. In the [place] one drink equals one 12 oz bottle of beer (355 mL), one 5 oz glass of wine (148 mL), or one 1 oz glass of hard liquor (44 mL). Lifestyle  Take daily care of your teeth and gums.  Stay active. Exercise for at least 30 minutes on 5 or more days each week.  Do not use any products that contain nicotine or tobacco, such as cigarettes, e-cigarettes, and chewing tobacco. If you need help quitting, ask your health care provider.  If you are sexually active, practice safe sex. Use a condom or other form of birth control (contraception) in order to prevent  pregnancy and STIs (sexually transmitted infections).  If told by your health care provider, take low-dose aspirin daily starting at age 19. What's next?  Visit your health care provider once a year for a well check visit.  Ask your health care provider how often you should have your eyes and teeth checked.  Stay up to date on all vaccines. This information is not intended to replace advice given to you by your health care provider. Make sure you discuss any questions you have with your health care provider. Document Revised: 11/26/2017 Document Reviewed: 11/26/2017 Elsevier Patient Education  2020 [employer].

## 2019-08-09 NOTE — Assessment & Plan Note (Addendum)
Remote history, repeat TSH pending.

## 2019-08-09 NOTE — Assessment & Plan Note (Signed)
No concerns for GERD symptoms.  Continue to monitor.

## 2019-08-18 DIAGNOSIS — M25511 Pain in right shoulder: Secondary | ICD-10-CM | POA: Insufficient documentation

## 2020-01-16 ENCOUNTER — Other Ambulatory Visit: Payer: Self-pay | Admitting: Endocrinology

## 2020-01-16 DIAGNOSIS — E042 Nontoxic multinodular goiter: Secondary | ICD-10-CM

## 2020-01-27 ENCOUNTER — Other Ambulatory Visit: Payer: Self-pay | Admitting: Obstetrics

## 2020-01-27 DIAGNOSIS — Z1231 Encounter for screening mammogram for malignant neoplasm of breast: Secondary | ICD-10-CM

## 2020-02-27 ENCOUNTER — Telehealth: Payer: Self-pay | Admitting: *Deleted

## 2020-02-27 NOTE — Telephone Encounter (Signed)
Noted and agree. 

## 2020-02-27 NOTE — Telephone Encounter (Signed)
Patient left a voicemail stating that she has a sore throat. Patient stated that she went to clear her throat and a lot of blood came out. Patient stated that she is not sure if she needs to go to an UC or not.  Called patient back and was advised that she is at an UC in [place] waiting to be seen now. Patient stated that she has been spitting up a lot of bright red blood and is concerned. Patient was advised that she is at the right place and should be evaluated. Marland Kitchen

## 2020-03-12 ENCOUNTER — Ambulatory Visit
Admission: RE | Admit: 2020-03-12 | Discharge: 2020-03-12 | Disposition: A | Discharge status: Home / Self Care | Payer: 59 | Source: Ambulatory Visit | Attending: Obstetrics | Admitting: Obstetrics

## 2020-03-12 ENCOUNTER — Other Ambulatory Visit: Payer: Self-pay

## 2020-03-12 DIAGNOSIS — Z1231 Encounter for screening mammogram for malignant neoplasm of breast: Secondary | ICD-10-CM

## 2020-03-16 ENCOUNTER — Other Ambulatory Visit: Payer: Self-pay | Admitting: Obstetrics

## 2020-03-16 DIAGNOSIS — R928 Other abnormal and inconclusive findings on diagnostic imaging of breast: Secondary | ICD-10-CM

## 2020-03-22 ENCOUNTER — Ambulatory Visit
Admission: RE | Admit: 2020-03-22 | Discharge: 2020-03-22 | Disposition: A | Discharge status: Home / Self Care | Payer: 59 | Source: Ambulatory Visit | Attending: Obstetrics | Admitting: Obstetrics

## 2020-03-22 ENCOUNTER — Other Ambulatory Visit: Payer: Self-pay

## 2020-03-22 ENCOUNTER — Ambulatory Visit: Payer: 59

## 2020-03-22 DIAGNOSIS — R928 Other abnormal and inconclusive findings on diagnostic imaging of breast: Secondary | ICD-10-CM

## 2020-04-03 ENCOUNTER — Other Ambulatory Visit (HOSPITAL_COMMUNITY): Payer: Self-pay | Admitting: Physician Assistant

## 2020-04-03 ENCOUNTER — Other Ambulatory Visit: Payer: Self-pay

## 2020-04-03 ENCOUNTER — Ambulatory Visit (HOSPITAL_COMMUNITY)
Admission: RE | Admit: 2020-04-03 | Discharge: 2020-04-03 | Disposition: A | Discharge status: Home / Self Care | Payer: 59 | Source: Ambulatory Visit | Attending: Cardiovascular Disease | Admitting: Cardiovascular Disease

## 2020-04-03 DIAGNOSIS — M79605 Pain in left leg: Secondary | ICD-10-CM | POA: Diagnosis present

## 2020-04-03 DIAGNOSIS — M79661 Pain in right lower leg: Secondary | ICD-10-CM

## 2020-04-03 DIAGNOSIS — M79662 Pain in left lower leg: Secondary | ICD-10-CM | POA: Diagnosis present

## 2020-04-04 ENCOUNTER — Other Ambulatory Visit: Payer: 59 | Admitting: *Deleted

## 2020-04-04 DIAGNOSIS — R011 Cardiac murmur, unspecified: Secondary | ICD-10-CM

## 2020-04-04 DIAGNOSIS — E782 Mixed hyperlipidemia: Secondary | ICD-10-CM

## 2020-04-04 DIAGNOSIS — I059 Rheumatic mitral valve disease, unspecified: Secondary | ICD-10-CM

## 2020-04-04 LAB — LIPID PANEL
Chol/HDL Ratio: 5.4 ratio — ABNORMAL HIGH (ref 0.0–4.4)
Cholesterol, Total: 273 mg/dL — ABNORMAL HIGH (ref 100–199)
HDL: 51 mg/dL (ref >39)
LDL Chol Calc (NIH): 178 mg/dL — ABNORMAL HIGH (ref 0–99)
Triglycerides: 235 mg/dL — ABNORMAL HIGH (ref 0–149)
VLDL Cholesterol Cal: 44 mg/dL — ABNORMAL HIGH (ref 5–40)

## 2020-04-04 LAB — HEPATIC FUNCTION PANEL
ALT: 12 IU/L (ref 0–32)
AST: 14 IU/L (ref 0–40)
Albumin: 4.6 g/dL (ref 3.8–4.8)
Alkaline Phosphatase: 77 IU/L (ref 44–121)
Bilirubin Total: 0.6 mg/dL (ref 0.0–1.2)
Bilirubin, Direct: 0.14 mg/dL (ref 0.00–0.40)
Total Protein: 6.6 g/dL (ref 6.0–8.5)

## 2020-04-04 LAB — BASIC METABOLIC PANEL
BUN/Creatinine Ratio: 13 (ref 9–23)
BUN: 10 mg/dL (ref 6–24)
CO2: 21 mmol/L (ref 20–29)
Calcium: 9.6 mg/dL (ref 8.7–10.2)
Chloride: 102 mmol/L (ref 96–106)
Creatinine, Ser: 0.78 mg/dL (ref 0.57–1.00)
GFR calc Af Amer: 108 mL/min/{1.73_m2} (ref >59)
GFR calc non Af Amer: 93 mL/min/{1.73_m2} (ref >59)
Glucose: 97 mg/dL (ref 65–99)
Potassium: 4.2 mmol/L (ref 3.5–5.2)
Sodium: 135 mmol/L (ref 134–144)

## 2020-04-05 ENCOUNTER — Other Ambulatory Visit: Payer: Self-pay | Admitting: *Deleted

## 2020-04-05 DIAGNOSIS — E782 Mixed hyperlipidemia: Secondary | ICD-10-CM

## 2020-04-10 NOTE — Progress Notes (Signed)
Patient ID: Elaine [name]                 DOB: Dec 31, 1975                    MRN: [id number]     HPI: Elaine [name] is a 45 y.o. female patient referred to lipid clinic by Dr. Acie Fredrickson. PMH is significant for HLD, alpha-1 antitrypsin deficiency (MZ variant), migraine with aura, GERD, hypothyroidism, and family history of premature CAD. She had a coronary calcium score 04/05/2018 of 0. She was seen 04/03/20 for a Doppler scan in her left leg which resulted negative for DVT. 10 year ASCVD risk score is 1.2%. She has not been on a statin. She was started on ezetimibe July 2020 but discontinued it (unknown date) due to shoulder pain she attributed to the medication. She was last seen by Dr. Acie Fredrickson in clinic 04/07/2019. During clinic, was encouraged to continue a good diet, exercise and weight loss program and lipid panel was drawn that day.   Pt presents today in good spirits for her first initial lipid clinic visit. Pt is frustrated by her cholesterol being elevated despite heart healthy diet. She reports a gluten intolerance/allergy with complete gluten elimination since October 2021. She reports losing ~15 lbs since doing this.   She does recall previously being on ezetimibe and reports waking up one day with frozen shoulder that left her somewhat immobile. This was treated with some injections but took a long time to fully recover. 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 She does not want to trial ezetimibe again.   She reports that she was initially put on ezetimibe and not on statins due to her alpha-1 antitrypsin deficiency and potential effect on LFTs. She reports having a prior history of elevated LFTs years ago when she was pregnant. She reports that she has symptomatic alpha-1 antitrypsin deficiency and that it was at its worse when she was hospitalized for diverticulitis. She reports having about 60% of her lung capacity and not currently being  treated on inhalers.    She also inquires about receiving a second COVID-19 booster given her reduced lung functioning.  Current Medications: fish oil 1,048m daily  Intolerances:  Ezetimibe - shoulder pain  Risk Factors: Family hx of premature CAD  LDL goal: < 100 mg/dL  Diet: Reports an allergy to gluten and does not eat out. For breakfast yesterday had steel cut oatmeal with maple syrup and cinnamon, plus a cup of coffee. She tries to limit her coffee intake to 1 cup/day. For lunch, she had chicken hearts with the fat cut out along with hummus and cucumbers. About once a week will have prepared frozen meals for dinner to help with schedule. She reports having carnitas frozen meal. Reports will be having beef stew tonight and sea bass tomorrow. She drinks 2-3 regular pepsi a week. Does endorse utilizing full fat dairy but she does not consume it often.  Pt reports setting goal of only eating < 6 tsp sugar/day.  Exercise: Working out is hard right now. She reports recently moving and her cat damaging her home gym. Her neighborhood is not conducive to walking outside. She is not comfortable going to local neighborhood gyms [place]. She does endorse staying active around the house as she is "on the go constantly."  Family History: The patient's family history includes Heart attack (age  of onset: 53) in her father, deceased from MI at 31; Hyperlipidemia in her father; Stroke (age of onset: 89) in her father. Grandmother with drug resistant cholesterol currently on ezetimibe   Social History: Former smoker; reports 2-3 glasses wine/week  Labs: Lipids: 04/04/20: TC 273, TG 235, HDL 51, LDL 178 (no therapy) 08/09/19: TG 214 01/14/19: TC 199, TG 129, HDL 50, LDL 126 (on zetia)  LFTs: 04/04/20: Alk Phos 77, AST 14, ALT 12 08/09/19: Alk Phos 61, AST 17, ALT 14 01/14/19: Alk Phos 76, AST 14, ALT 12  Past Medical History:  Diagnosis Date  . Alpha-1-antitrypsin  deficiency ([place])   . Diverticulitis    with sepsis  . Diverticulosis   . Fibroids    Vaginal  . GERD (gastroesophageal reflux disease)   . 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 Hyperlipidemia   . Hypothyroidism   . Malignant hyperthermia   . Psoriasis   . Ruptured ovarian cyst 2004,2016  . Seizures ([place])     Current Outpatient Medications on File Prior to Visit  Medication Sig Dispense Refill  . fluticasone (FLONASE) 50 MCG/ACT nasal spray Place 2 sprays into both nostrils daily.    Javier Docker Oil 1000 MG CAPS Take by mouth.    Marland Kitchen MAGNESIUM PO Take 1,250 mg by mouth daily.    . Melatonin 5 MG TABS Take 15 mg by mouth.    . mometasone (ELOCON) 0.1 % cream Apply 1 application topically daily. 45 g 0  . Multiple Vitamin (MULTIVITAMIN ADULT PO) Take by mouth.    . Multiple Vitamins-Minerals (MULTI-VITAMIN GUMMIES PO) Take 6 each by mouth daily.    . Nutritional Supplements (VITAMIN D BOOSTER PO) Take 400 Units by mouth daily.    Marland Kitchen ZOLMitriptan (ZOMIG) 2.5 MG tablet Take 1 tablet by mouth at migraine onset. May repeat in 2 hours if headache persists or recurs. 10 tablet 0   No current facility-administered medications on file prior to visit.    Allergies  Allergen Reactions  . Anesthetics, Amide Anaphylaxis  . Other Anaphylaxis  . Cephalexin   . Dilaudid  [Hydromorphone Hcl] Nausea And Vomiting  . Penicillins     Assessment/Plan:  1. Hyperlipidemia - LDL 178 on no therapy above goal LDL < 100 mg/dL. Goal chosen due to patient's family history of premature ASCVD with father deceased at 14 d/t MI. Current diet appears optimal with some areas for improvement. Will start rosuvastatin 5 mg daily. Discussed that statins are not contraindicated with history of alpha-1 antitrypsin deficiency, but will monitor LFTs closely with recheck in 1 month. Discussed that low dose of rosuvastatin will have lower rates of side effects and LFT increase than other statins. Will also  recheck advanced lipid panel and LFTs in 3 months. Encouraged patient to continue current diet choices and to continue decreasing daily sugar consumption. Discussed importance of exercise on lowering cholesterol, but due to current circumstances, will focus less on this until pt feels comfortable.   2. COVID-19 Vaccine Second Booster -  Discussed with patient that she does qualify for second vaccine booster due to compromised state and increased risk of lung disease due to alpha-1 antitrypsin deficiency. Provided patient with phone number to [facility] and directed patient to [facility] to inquire about booster. Provided patient with letterhead explaining that she qualifies for second booster.   Pt seen with Elita Quick, P4 pharmacy student  Harlon Flor. Supple, PharmD, BCACP, CPP [facility] [address] [place], [place] [address] Phone: [phone]; Fax: [phone] 04/11/2020 12:30 PM

## 2020-04-11 ENCOUNTER — Ambulatory Visit (INDEPENDENT_AMBULATORY_CARE_PROVIDER_SITE_OTHER): Payer: 59 | Admitting: Pharmacist

## 2020-04-11 ENCOUNTER — Encounter: Payer: Self-pay | Admitting: Pharmacist

## 2020-04-11 ENCOUNTER — Other Ambulatory Visit: Payer: Self-pay

## 2020-04-11 VITALS — Wt 203.0 lb

## 2020-04-11 DIAGNOSIS — E782 Mixed hyperlipidemia: Secondary | ICD-10-CM

## 2020-04-11 MED ORDER — ROSUVASTATIN CALCIUM 5 MG PO TABS: 5.0000 mg | ORAL_TABLET | Freq: Every day | ORAL | 11 refills | Status: DC

## 2020-04-11 NOTE — Patient Instructions (Addendum)
It was a pleasure to meet you today!   Your current LDL is 178. Your LDL goal is less than 100  Start taking rosuvastatin 5 mg 1 tablet daily.  We will check your liver function on February 7th any time after 7:30 AM  We will check your cholesterol with a fasting lab on April 11th anytime after 7:30 AM.  Continue your healthy diet and exercise as you can!  Here is the phone number for COVID boosters: [phone]. You can also stop by [facility] to see about the COVID booster. Your cardiologist recommends a second booster given your immunocompromised state. [address], [place], [place] [address]

## 2020-04-18 DIAGNOSIS — M5136 Other intervertebral disc degeneration, lumbar region: Secondary | ICD-10-CM | POA: Insufficient documentation

## 2020-04-25 ENCOUNTER — Ambulatory Visit: Payer: 59

## 2020-04-25 ENCOUNTER — Encounter: Payer: Self-pay | Admitting: Cardiovascular Disease

## 2020-04-25 NOTE — Progress Notes (Signed)
Cardiology Office Note:    Date:  04/26/2020   ID:  Elaine [name], DOB 04/25/1975, MRN [id number]  PCP:  Pleas Koch, NP  Cardiologist:  Mertie Moores, MD  Electrophysiologist:  None   Referring MD: Pleas Koch, NP   Chief Complaint  Patient presents with  . Hyperlipidemia     Jan. 6, 2020   Elaine [name] is a 45 y.o. female with a strong family history of premature coronary artery disease.  We are asked to see her today for further evaluation by Pleas Koch, NP   She is seen with her husband, Thora Lance and father had significant CAD - ( smoked heavily)   She also has episodes of tachycardia  She had an episode of sepsis related to diverticulitis.   She just started exercising. 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   They farm,   ( chickens , ducks, turkeys, gardens ) Has gained 20 lbs this past year -     Started exercising this past Nov.    Walks, has alpha 1 antitripsin deficiency - is not on inhalers  Sees pulmonary   Non smoker   Has leg swelling in the evening    Jan. 7, 2021 Elaine [name] is seen for follow up of her hyperlipidemia .  She has a strong family history of coronary artery disease.  She has alpha-1 antitrypsin disorder and has not been on a statin medication. Her coronary calcium score is no (Jan. 6, 2020)   Has bilateral shoulder pain she relates to starting Zetia . Has been taking black seed oil with some relief.   Also had some diarrhea - has resolved.  .  Walks 30-45 minutes  A day .    Jan. 27, 2022: Elaine [name] is seen today for follow up of her HLD. 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  Is on Rosuvastatin   Has changed her diet,  Avoids gluten now   Encouraged her  to exercise more .   Past Medical History:  Diagnosis Date  . Alpha-1-antitrypsin deficiency ([place])   . Diverticulitis    with sepsis  . Diverticulosis   . Fibroids    Vaginal  . GERD (gastroesophageal reflux disease)   . 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 Hyperlipidemia   . Hypothyroidism   . Malignant hyperthermia   . Psoriasis   . Ruptured ovarian cyst 2004,2016  . Seizures ([place])     Past Surgical History:  Procedure Laterality Date  . DENTAL SURGERY    . LAPAROSCOPIC SIGMOID COLECTOMY  2013    Current Medications: Current Meds  Medication Sig  . fluticasone (FLONASE) 50 MCG/ACT nasal spray Place 2 sprays into both nostrils daily.  Marland Kitchen MAGNESIUM PO Take 1,250 mg by mouth daily.  . Melatonin 5 MG TABS Take 15 mg by mouth.  . mometasone (ELOCON) 0.1 % cream Apply 1 application topically daily.  . Multiple Vitamin (MULTIVITAMIN ADULT PO) Take by mouth.  . Multiple Vitamins-Minerals (MULTI-VITAMIN GUMMIES PO) Take 6 each by mouth daily.  . Nutritional Supplements (VITAMIN D BOOSTER PO) Take 400 Units by mouth daily.  . Omega-3 Fatty  Acids (FISH OIL) 500 MG CAPS Take 2 capsules by mouth daily.  . rosuvastatin (CRESTOR) 5 MG tablet Take 1 tablet (5 mg total) by mouth daily.  . tranexamic acid (LYSTEDA) 650 MG TABS tablet Take 1 tablet by mouth as directed.  Marland Kitchen ZOLMitriptan (ZOMIG) 2.5 MG tablet Take 1 tablet by mouth at migraine onset. May repeat in 2 hours if headache persists or recurs.     Allergies:   Anesthetics, amide; Other; Cephalexin; Dilaudid  [hydromorphone hcl]; and Penicillins   Social History   Socioeconomic History  . Marital status: Married    Spouse name: Not on file  . Number of children: Not on file  . Years of education: Not on file  . Highest education level: Not on file  Occupational History  . Not on file  Tobacco Use  . Smoking status: Former Research scientist (life sciences)  . Smokeless tobacco: Never Used  Substance and Sexual Activity  .  Alcohol use: Yes  . Drug use: Not on file  . Sexual activity: Not on file  Other Topics Concern  . Not on file  Social History Narrative   Married.   1 child.   Works as a Psychologist, sport and exercise.    Social Determinants of Health   Financial Resource Strain: Not on file  Food Insecurity: Not on file  Transportation Needs: Not on file  Physical Activity: Not on file  Stress: Not on file  Social Connections: Not on file     Family History: The patient's family history includes Heart attack (age of onset: 4) in her father; Hyperlipidemia in her father; Stroke (age of onset: 91) in her father.  ROS:   Please see the history of present illness.     All other systems reviewed and are negative.  EKGs/Labs/Other Studies Reviewed:      EKG:   Jan. 27, 2022;   NSR at 70.  No ST or T wave changes.   Recent Labs: 08/09/2019: Hemoglobin 13.2; Platelets 322.0; TSH 1.92 04/04/2020: ALT 12; BUN 10; Creatinine, Ser 0.78; Potassium 4.2; Sodium 135  Recent Lipid Panel    Component Value Date/Time   CHOL 273 (H) 04/04/2020 0748   TRIG 235 (H) 04/04/2020 0748   HDL 51 04/04/2020 0748   CHOLHDL 5.4 (H) 04/04/2020 0748   CHOLHDL 5 08/09/2019 1143   VLDL 42.8 (H) 08/09/2019 1143   LDLCALC 178 (H) 04/04/2020 0748   LDLDIRECT 156.0 08/09/2019 1143    Physical Exam:     Physical Exam: Blood pressure 104/80, pulse 70, height 5\' 11"  (1.803 m), weight 208 lb (94.3 kg), SpO2 99 %.  GEN:  Well nourished, well developed in no acute distress HEENT: Normal NECK: No JVD; No carotid bruits LYMPHATICS: No lymphadenopathy CARDIAC: RRR , no murmurs, rubs, gallops RESPIRATORY:  Clear to auscultation without rales, wheezing or rhonchi  ABDOMEN: Soft, non-tender, non-distended MUSCULOSKELETAL:  No edema; No deformity  SKIN: Warm and dry NEUROLOGIC:  Alert and oriented x 3    ASSESSMENT:    1. Mixed hyperlipidemia   2. Mitral valve disorder    PLAN:        family hx of CAD :   will cont rosuvastatin  . Check labs in several months.   I would have a low threshold to start her on a PCSK9 inhibitor.  2.  Alpha 1 antitripsin deficiency .  MZ varient . .   3.  Hyperlipidemia: Her LDL is 138.   Medication Adjustments/Labs and Tests Ordered: Current medicines are reviewed at  length with the patient today.  Concerns regarding medicines are outlined above.  Orders Placed This Encounter  Procedures  . EKG 12-Lead   No orders of the defined types were placed in this encounter.   Patient Instructions  Medication Instructions:  Your physician recommends that you continue on your current medications as directed. Please refer to the Current Medication list given to you today.  *If you need a refill on your cardiac medications before your next appointment, please call your pharmacy*   Lab Work: Keep your upcoming lab appointments If you have labs (blood work) drawn today and your tests are completely normal, you will receive your results only by: Marland Kitchen MyChart Message (if you have MyChart) OR . A paper copy in the mail If you have any lab test that is abnormal or we need to change your treatment, we will call you to review the results.    Testing/Procedures: None Ordered    Follow-Up: At [facility], you and your health needs are our priority.  As part of our continuing mission to provide you with exceptional heart care, we have created designated Provider Care Teams.  These Care Teams include your primary Cardiologist (physician) and Advanced Practice Providers (APPs -  Physician Assistants and Nurse Practitioners) who all work together to provide you with the care you need, when you need it.  We recommend signing up for the patient portal called "MyChart".  Sign up information is provided on this After Visit Summary.  MyChart is used to connect with patients for Virtual Visits (Telemedicine).  Patients are able to view lab/test results, encounter notes, upcoming appointments, etc.   Non-urgent messages can be sent to your provider as well.   To learn more about what you can do with MyChart, go to [email or web address].    Your next appointment:   1 year(s)  The format for your next appointment:   In Person  Provider:   You may see Mertie Moores, MD or one of the following Advanced Practice Providers on your designated Care Team:    Richardson Dopp, PA-C  Robbie Lis, [place]        Signed, Mertie Moores, MD  04/26/2020 10:19 AM    [place]

## 2020-04-26 ENCOUNTER — Other Ambulatory Visit: Payer: Self-pay

## 2020-04-26 ENCOUNTER — Ambulatory Visit: Payer: 59 | Admitting: Cardiovascular Disease

## 2020-04-26 ENCOUNTER — Encounter: Payer: Self-pay | Admitting: Cardiovascular Disease

## 2020-04-26 VITALS — BP 104/80 | HR 70 | Ht 71.0 in | Wt 208.0 lb

## 2020-04-26 DIAGNOSIS — E8801 Alpha-1-antitrypsin deficiency: Secondary | ICD-10-CM

## 2020-04-26 DIAGNOSIS — E782 Mixed hyperlipidemia: Secondary | ICD-10-CM | POA: Diagnosis not present

## 2020-04-26 DIAGNOSIS — I059 Rheumatic mitral valve disease, unspecified: Secondary | ICD-10-CM

## 2020-04-26 NOTE — Patient Instructions (Addendum)
Medication Instructions:  Your physician recommends that you continue on your current medications as directed. Please refer to the Current Medication list given to you today.  *If you need a refill on your cardiac medications before your next appointment, please call your pharmacy*   Lab Work: Keep your upcoming lab appointments If you have labs (blood work) drawn today and your tests are completely normal, you will receive your results only by:  [place] (if you have MyChart) OR  A paper copy in the mail If you have any lab test that is abnormal or we need to change your treatment, we will call you to review the results.    Testing/Procedures: None Ordered    Follow-Up: At [facility], you and your health needs are our priority.  As part of our continuing mission to provide you with exceptional heart care, we have created designated Provider Care Teams.  These Care Teams include your primary Cardiologist (physician) and Advanced Practice Providers (APPs -  Physician Assistants and Nurse Practitioners) who all work together to provide you with the care you need, when you need it.  We recommend signing up for the patient portal called "MyChart".  Sign up information is provided on this After Visit Summary.  MyChart is used to connect with patients for Virtual Visits (Telemedicine).  Patients are able to view lab/test results, encounter notes, upcoming appointments, etc.  Non-urgent messages can be sent to your provider as well.   To learn more about what you can do with MyChart, go to [email or web address].    Your next appointment:   1 year(s)  The format for your next appointment:   In Person  Provider:   You may see Mertie Moores, MD or one of the following Advanced Practice Providers on your designated Care Team:    Richardson Dopp, PA-C  [place], [place]

## 2020-05-07 ENCOUNTER — Other Ambulatory Visit: Payer: 59 | Admitting: *Deleted

## 2020-05-07 ENCOUNTER — Other Ambulatory Visit: Payer: Self-pay

## 2020-05-07 DIAGNOSIS — E782 Mixed hyperlipidemia: Secondary | ICD-10-CM

## 2020-05-07 LAB — HEPATIC FUNCTION PANEL
ALT: 11 IU/L (ref 0–32)
AST: 13 IU/L (ref 0–40)
Albumin: 4.6 g/dL (ref 3.8–4.8)
Alkaline Phosphatase: 80 IU/L (ref 44–121)
Bilirubin Total: 1 mg/dL (ref 0.0–1.2)
Bilirubin, Direct: 0.22 mg/dL (ref 0.00–0.40)
Total Protein: 7 g/dL (ref 6.0–8.5)

## 2020-05-09 ENCOUNTER — Other Ambulatory Visit: Payer: Self-pay | Admitting: Internal Medicine

## 2020-05-09 ENCOUNTER — Telehealth: Payer: Self-pay | Admitting: Pharmacist

## 2020-05-09 NOTE — Telephone Encounter (Signed)
Left message for pt to discuss lab work.  LFTs are stable since starting rosuvastatin 5mg  one month ago. Will plan for pt to continue on current therapy and advise her to keep 4/11 appt to recheck lipids.

## 2020-05-09 NOTE — Telephone Encounter (Signed)
Spoke with pt and relayed below message. She was appreciative for call.

## 2020-05-16 ENCOUNTER — Encounter: Payer: Self-pay | Admitting: Primary Care

## 2020-06-30 ENCOUNTER — Other Ambulatory Visit: Payer: Self-pay | Admitting: Cardiovascular Disease

## 2020-06-30 DIAGNOSIS — E782 Mixed hyperlipidemia: Secondary | ICD-10-CM

## 2020-07-02 NOTE — Telephone Encounter (Signed)
Pharmacy comment: Alternative Requested:PT PAY $20 FOR ROSUVASTATIN 5MG , REQUEST LOWER-COST ALT: ATORV 10MG  $0; SIMVASTATI20MG  $0; LOVASTATI40MG  $10. Please address

## 2020-07-09 ENCOUNTER — Other Ambulatory Visit: Payer: Self-pay

## 2020-07-09 ENCOUNTER — Encounter (INDEPENDENT_AMBULATORY_CARE_PROVIDER_SITE_OTHER): Payer: Self-pay

## 2020-07-09 ENCOUNTER — Other Ambulatory Visit: Payer: 59 | Admitting: *Deleted

## 2020-07-09 DIAGNOSIS — E782 Mixed hyperlipidemia: Secondary | ICD-10-CM

## 2020-07-10 LAB — HEPATIC FUNCTION PANEL
ALT: 13 IU/L (ref 0–32)
AST: 11 IU/L (ref 0–40)
Albumin: 4.4 g/dL (ref 3.8–4.8)
Alkaline Phosphatase: 76 IU/L (ref 44–121)
Bilirubin Total: 0.9 mg/dL (ref 0.0–1.2)
Bilirubin, Direct: 0.21 mg/dL (ref 0.00–0.40)
Total Protein: 6.4 g/dL (ref 6.0–8.5)

## 2020-07-10 LAB — NMR, LIPOPROFILE
Cholesterol, Total: 166 mg/dL (ref 100–199)
HDL Particle Number: 32.9 umol/L (ref >=30.5)
HDL-C: 46 mg/dL (ref >39)
LDL Particle Number: 1164 nmol/L — ABNORMAL HIGH (ref <1000)
LDL Size: 21.4 nm (ref >20.5)
LDL-C (NIH Calc): 100 mg/dL — ABNORMAL HIGH (ref 0–99)
LP-IR Score: 52 — ABNORMAL HIGH (ref <=45)
Small LDL Particle Number: 429 nmol/L (ref <=527)
Triglycerides: 110 mg/dL (ref 0–149)

## 2020-07-10 LAB — LIPOPROTEIN A (LPA): Lipoprotein (a): 87 nmol/L — ABNORMAL HIGH (ref <75.0)

## 2020-07-12 ENCOUNTER — Telehealth: Payer: Self-pay | Admitting: Pharmacist

## 2020-07-12 MED ORDER — ROSUVASTATIN CALCIUM 5 MG PO TABS: 5.0000 mg | ORAL_TABLET | Freq: Every day | ORAL | 3 refills | Status: DC

## 2020-07-12 NOTE — Telephone Encounter (Signed)
Also spoke with pt about her advanced lipid panel results. Baseline LDL 178, has improved to 100 on rosuvastatin 5mg  daily. Have been keeping a close eye on pt's LFTs because of her alpha 1 antitrypsin deficiency and prior LFT elevation - LFTs have been stable. LDL P slightly elevated, Lp(a) also slightly elevated but pt has known family history of heart disease as well. Targeting LDL goal < 100. Discussed increasing rosuvastatin dose to target LDL P < 1000 but pt does not wish to increase her dose.  Looks as though she was changed from rosuvastatin 5mg  to atorvastatin 10mg  on 4/5 because her pharmacy indicated that atorvastatin is free and her rosuvastatin was $20. Pt states she is not worried about the cost and prefers to continue on rosuvastatin since she's tolerating it well. Sent in new rx for rosuvastatin again.

## 2020-08-15 ENCOUNTER — Encounter: Payer: Self-pay | Admitting: Primary Care

## 2020-08-15 ENCOUNTER — Ambulatory Visit (INDEPENDENT_AMBULATORY_CARE_PROVIDER_SITE_OTHER): Payer: 59 | Admitting: Primary Care

## 2020-08-15 ENCOUNTER — Other Ambulatory Visit: Payer: Self-pay

## 2020-08-15 VITALS — BP 110/82 | HR 78 | Temp 97.6°F | Ht 71.0 in | Wt 210.0 lb

## 2020-08-15 DIAGNOSIS — K219 Gastro-esophageal reflux disease without esophagitis: Secondary | ICD-10-CM | POA: Diagnosis not present

## 2020-08-15 DIAGNOSIS — G43109 Migraine with aura, not intractable, without status migrainosus: Secondary | ICD-10-CM

## 2020-08-15 DIAGNOSIS — Z114 Encounter for screening for human immunodeficiency virus [HIV]: Secondary | ICD-10-CM

## 2020-08-15 DIAGNOSIS — Z1159 Encounter for screening for other viral diseases: Secondary | ICD-10-CM

## 2020-08-15 DIAGNOSIS — E8801 Alpha-1-antitrypsin deficiency: Secondary | ICD-10-CM

## 2020-08-15 DIAGNOSIS — R109 Unspecified abdominal pain: Secondary | ICD-10-CM

## 2020-08-15 DIAGNOSIS — N939 Abnormal uterine and vaginal bleeding, unspecified: Secondary | ICD-10-CM

## 2020-08-15 DIAGNOSIS — L409 Psoriasis, unspecified: Secondary | ICD-10-CM

## 2020-08-15 DIAGNOSIS — R41840 Attention and concentration deficit: Secondary | ICD-10-CM

## 2020-08-15 DIAGNOSIS — M549 Dorsalgia, unspecified: Secondary | ICD-10-CM | POA: Insufficient documentation

## 2020-08-15 DIAGNOSIS — Z0000 Encounter for general adult medical examination without abnormal findings: Secondary | ICD-10-CM | POA: Diagnosis not present

## 2020-08-15 DIAGNOSIS — E538 Deficiency of other specified B group vitamins: Secondary | ICD-10-CM | POA: Diagnosis not present

## 2020-08-15 DIAGNOSIS — E039 Hypothyroidism, unspecified: Secondary | ICD-10-CM | POA: Diagnosis not present

## 2020-08-15 DIAGNOSIS — E782 Mixed hyperlipidemia: Secondary | ICD-10-CM

## 2020-08-15 DIAGNOSIS — E559 Vitamin D deficiency, unspecified: Secondary | ICD-10-CM | POA: Diagnosis not present

## 2020-08-15 DIAGNOSIS — G8929 Other chronic pain: Secondary | ICD-10-CM | POA: Insufficient documentation

## 2020-08-15 DIAGNOSIS — M545 Low back pain, unspecified: Secondary | ICD-10-CM

## 2020-08-15 DIAGNOSIS — Z8249 Family history of ischemic heart disease and other diseases of the circulatory system: Secondary | ICD-10-CM

## 2020-08-15 LAB — VITAMIN D 25 HYDROXY (VIT D DEFICIENCY, FRACTURES): VITD: 32.31 ng/mL (ref 30.00–100.00)

## 2020-08-15 LAB — T4, FREE: Free T4: 0.67 ng/dL (ref 0.60–1.60)

## 2020-08-15 LAB — TSH: TSH: 1.47 u[IU]/mL (ref 0.35–4.50)

## 2020-08-15 LAB — VITAMIN B12: Vitamin B-12: 525 pg/mL (ref 211–911)

## 2020-08-15 MED ORDER — MUPIROCIN 2 % EX OINT: 1.0000 "application " | TOPICAL_OINTMENT | Freq: Two times a day (BID) | CUTANEOUS | 0 refills | Status: DC

## 2020-08-15 MED ORDER — MOMETASONE FUROATE 0.1 % EX CREA: 1.0000 "application " | TOPICAL_CREAM | Freq: Every day | CUTANEOUS | 0 refills | Status: DC

## 2020-08-15 NOTE — Assessment & Plan Note (Signed)
Immunizations up-to-date. Mammogram up-to-date. Pap smear up-to-date, follows with GYN.  Encouraged a healthy diet and regular exercise, commended her on her efforts thus far.  Exam today as noted. Labs pending

## 2020-08-15 NOTE — Assessment & Plan Note (Signed)
Occurring 2-3 times monthly, headaches 5 days weekly. Overall stable, has not used Zomig 2.5 mg every due to fear of potential side effects.   Mostly manages with OTC treatment, BC powder with energy drink.  She declines other treatment. Had bad side effects with Topamax.  Continue to monitor.

## 2020-08-15 NOTE — Assessment & Plan Note (Addendum)
Infrequent, doing well on PRN mometasone cream, continue same. Refill provided today.

## 2020-08-15 NOTE — Assessment & Plan Note (Signed)
Doing well on rosuvastatin 5 mg, continue same. Lipid panel from recent labs reviewed

## 2020-08-15 NOTE — Assessment & Plan Note (Signed)
No recent pulmonology visit, she didn't care for her last pulmonologist.   Referral placed.

## 2020-08-15 NOTE — Assessment & Plan Note (Signed)
Ongoing battle, taking OTC omeprazole. Referral placed to GI.

## 2020-08-15 NOTE — Progress Notes (Signed)
Subjective:    Patient ID: Elaine [name], female    DOB: January 27, 1976, 45 y.o.   MRN: [id number]  HPI  Elaine [name] is a very pleasant 45 y.o. female who presents today for complete physical.  She would also like to be referred to another GYN for evaluation of menorrhagia and prolonged menses. Her current GYN is wanting to treat with an IUD but the patient is wanting hysterectomy. The Lysteda isn't effective for bleeding.   She is also requesting a referral for a new GI doctor for evaluation of celiac, constipation, food allergies. She would like an updated colonoscopy given her history of sigmoid colectomy in her mid 47s.  She would like to undergo food testing for highly suspected celiac allergy, would also like general food allergy testing.  She has not seen pulmonology recently as she felt that her last pulmonology provider was blowing her off.  She is requesting to see a different pulmonologist for maintenance and follow-up.  She was unable to connect with the psychologist for ADHD testing as the psychologist refused to test her as he treats her daughter.  She has yet to further investigate testing.  Immunizations: -Tetanus: 2015 -Influenza: Completed this season  -Covid-19: Completed four vaccines  Diet: She endorses a healthy diet.  Exercise: She is exercising.   Eye exam: Completes annually  Dental exam: Completes semi-annually  Pap Smear: UTD per patient, Follows with GYN Mammogram: December 2021   Review of Systems  Constitutional: Negative for unexpected weight change.  HENT: Negative for rhinorrhea.   Eyes: Negative for visual disturbance.  Respiratory: Negative for cough and shortness of breath.   Cardiovascular: Negative for chest pain.  Gastrointestinal: Positive for abdominal pain and constipation. Negative for diarrhea.  Genitourinary: Positive for menstrual problem. Negative for difficulty urinating.  Musculoskeletal: Positive for arthralgias and back pain.   Skin: Negative for rash.  Allergic/Immunologic: Negative for environmental allergies.  Neurological: Positive for headaches. Negative for dizziness.  Psychiatric/Behavioral: The patient is not nervous/anxious.          Past Medical History:  Diagnosis Date  . Alpha-1-antitrypsin deficiency ([place])   . Diverticulitis    with sepsis  . Diverticulosis   . Fibroids    Vaginal  . GERD (gastroesophageal reflux disease)   . 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 Hyperlipidemia   . Hypothyroidism   . Malignant hyperthermia   . Psoriasis   . Ruptured ovarian cyst 2004,2016  . Seizures ([place])     Social History   Socioeconomic History  . Marital status: Married    Spouse name: Not on file  . Number of children: Not on file  . Years of education: Not on file  . Highest education level: Not on file  Occupational History  . Not on file  Tobacco Use  . Smoking status: Former Research scientist (life sciences)  . Smokeless tobacco: Never Used  Substance and Sexual Activity  . Alcohol use: Yes  . Drug use: Not on file  . Sexual activity: Not on file  Other Topics Concern  . Not on file  Social History Narrative   Married.   1 child.   Works as a Psychologist, sport and exercise.    Social Determinants of Health   Financial Resource Strain: Not on file  Food Insecurity: Not on file  Transportation Needs: Not on file  Physical Activity: Not on file  Stress: Not on file  Social Connections: Not on file  Intimate Partner Violence:  Not on file    Past Surgical History:  Procedure Laterality Date  . DENTAL SURGERY    . LAPAROSCOPIC SIGMOID COLECTOMY  2013    Family History  Problem Relation Age of Onset  . Stroke Father 6  . Heart attack Father 59  . Hyperlipidemia Father     Allergies  Allergen Reactions  . Anesthetics, [place]ide Anaphylaxis  . Other Anaphylaxis  . Cephalexin   . Dilaudid  [Hydromorphone Hcl] Nausea And Vomiting  . Penicillins     Current Outpatient Medications on File Prior  to Visit  Medication Sig Dispense Refill  . fluticasone (FLONASE) 50 MCG/ACT nasal spray Place 2 sprays into both nostrils daily.    Marland Kitchen MAGNESIUM PO Take 1,250 mg by mouth daily.    . Melatonin 5 MG TABS Take 15 mg by mouth.    . methocarbamol (ROBAXIN) 500 MG tablet methocarbamol 500 mg tablet  Take 1 tablet 3 times a day by oral route as needed.    . mometasone (ELOCON) 0.1 % cream Apply 1 application topically daily. 45 g 0  . Multiple Vitamin (MULTIVITAMIN ADULT PO) Take by mouth.    . Multiple Vitamins-Minerals (MULTI-VITAMIN GUMMIES PO) Take 6 each by mouth daily.    . Nutritional Supplements (VITAMIN D BOOSTER PO) Take 400 Units by mouth daily.    . Omega-3 Fatty Acids (FISH OIL) 500 MG CAPS Take 2 capsules by mouth daily.    . rosuvastatin (CRESTOR) 5 MG tablet Take 1 tablet (5 mg total) by mouth daily. 90 tablet 3  . traMADol (ULTRAM) 50 MG tablet tramadol 50 mg tablet  Take 1 tablet every 6 hours by oral route.    . tranexamic acid (LYSTEDA) 650 MG TABS tablet Take 1 tablet by mouth as directed.    Marland Kitchen ZOLMitriptan (ZOMIG) 2.5 MG tablet Take 1 tablet by mouth at migraine onset. May repeat in 2 hours if headache persists or recurs. 10 tablet 0   No current facility-administered medications on file prior to visit.    There were no vitals taken for this visit. Objective:   Physical Exam HENT:     Right Ear: Tympanic membrane and ear canal normal.     Left Ear: Tympanic membrane and ear canal normal.     Nose: Nose normal.  Eyes:     Conjunctiva/sclera: Conjunctivae normal.     Pupils: Pupils are equal, round, and reactive to light.  Neck:     Thyroid: No thyromegaly.  Cardiovascular:     Rate and Rhythm: Normal rate and regular rhythm.     Heart sounds: No murmur heard.   Pulmonary:     Effort: Pulmonary effort is normal.     Breath sounds: Normal breath sounds. No rales.  Abdominal:     General: Bowel sounds are normal.     Palpations: Abdomen is soft.     Tenderness:  There is no abdominal tenderness.  Musculoskeletal:        General: Normal range of motion.     Cervical back: Neck supple.  Lymphadenopathy:     Cervical: No cervical adenopathy.  Skin:    General: Skin is warm and dry.     Findings: No rash.  Neurological:     Mental Status: She is alert and oriented to person, place, and time.     Cranial Nerves: No cranial nerve deficit.     Deep Tendon Reflexes: Reflexes are normal and symmetric.  Psychiatric:  Mood and Affect: Mood normal.           Assessment & Plan:      This visit occurred during the SARS-CoV-2 public health emergency.  Safety protocols were in place, including screening questions prior to the visit, additional usage of staff PPE, and extensive cleaning of exam room while observing appropriate contact time as indicated for disinfecting solutions.

## 2020-08-15 NOTE — Assessment & Plan Note (Signed)
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 Has undergone steroid treatment.

## 2020-08-15 NOTE — Assessment & Plan Note (Signed)
Repeat thyroid levels pending.

## 2020-08-15 NOTE — Assessment & Plan Note (Signed)
Not taking B12 supplements, repeat B12 level pending.

## 2020-08-15 NOTE — Assessment & Plan Note (Signed)
Not taking vitamin D supplements, repeat level pending.

## 2020-08-15 NOTE — Assessment & Plan Note (Signed)
Dr. Tenna Delaine unable to evaluate patient as he evaluated her daughter.  Recommended [facility]. She will call and update.

## 2020-08-15 NOTE — Assessment & Plan Note (Signed)
Evaluated by cardiology in early 2022, doing well on statin therapy. Recent lipid panel pending.

## 2020-08-15 NOTE — Patient Instructions (Signed)
Stop by the lab prior to leaving today. I will notify you of your results once received.   You will be contacted regarding your referral to Gynecology, GI, and pulmonology.  Please let us know if you have not been contacted within two weeks.   Try [facility] for ADHD testing and treatment.   It was a pleasure to see you today!   Preventive Care 76-44 Years Old, Female Preventive care refers to lifestyle choices and visits with your health care provider that can promote health and wellness. This includes:  A yearly physical exam. This is also called an annual wellness visit.  Regular dental and eye exams.  Immunizations.  Screening for certain conditions.  Healthy lifestyle choices, such as: ? Eating a healthy diet. ? Getting regular exercise. ? Not using drugs or products that contain nicotine and tobacco. ? Limiting alcohol use. What can I expect for my preventive care visit? Physical exam Your health care provider will check your:  Height and weight. These may be used to calculate your BMI (body mass index). BMI is a measurement that tells if you are at a healthy weight.  Heart rate and blood pressure.  Body temperature.  Skin for abnormal spots. Counseling Your health care provider may ask you questions about your:  Past medical problems.  Family's medical history.  Alcohol, tobacco, and drug use.  Emotional well-being.  Home life and relationship well-being.  Sexual activity.  Diet, exercise, and sleep habits.  Work and work Statistician.  Access to firearms.  Method of birth control.  Menstrual cycle.  Pregnancy history. What immunizations do I need? Vaccines are usually given at various ages, according to a schedule. Your health care provider will recommend vaccines for you based on your age, medical history, and lifestyle or other factors, such as travel or where you work.   What tests do I need? Blood tests  Lipid and  cholesterol levels. These may be checked every 5 years, or more often if you are over 70 years old.  Hepatitis C test.  Hepatitis B test. Screening  Lung cancer screening. You may have this screening every year starting at age 91 if you have a 30-pack-year history of smoking and currently smoke or have quit within the past 15 years.  Colorectal cancer screening. ? All adults should have this screening starting at age 4 and continuing until age 75. ? Your health care provider may recommend screening at age 76 if you are at increased risk. ? You will have tests every 1-10 years, depending on your results and the type of screening test.  Diabetes screening. ? This is done by checking your blood sugar (glucose) after you have not eaten for a while (fasting). ? You may have this done every 1-3 years.  Mammogram. ? This may be done every 1-2 years. ? Talk with your health care provider about when you should start having regular mammograms. This may depend on whether you have a family history of breast cancer.  BRCA-related cancer screening. This may be done if you have a family history of breast, ovarian, tubal, or peritoneal cancers.  Pelvic exam and Pap test. ? This may be done every 3 years starting at age 15. ? Starting at age 47, this may be done every 5 years if you have a Pap test in combination with an HPV test. Other tests  STD (sexually transmitted disease) testing, if you are at risk.  Bone density scan. This is done to  screen for osteoporosis. You may have this scan if you are at high risk for osteoporosis. Talk with your health care provider about your test results, treatment options, and if necessary, the need for more tests. Follow these instructions at home: Eating and drinking  Eat a diet that includes fresh fruits and vegetables, whole grains, lean protein, and low-fat dairy products.  Take vitamin and mineral supplements as recommended by your health care  provider.  Do not drink alcohol if: ? Your health care provider tells you not to drink. ? You are pregnant, may be pregnant, or are planning to become pregnant.  If you drink alcohol: ? Limit how much you have to 0-1 drink a day. ? Be aware of how much alcohol is in your drink. In the [place] one drink equals one 12 oz bottle of beer (355 mL), one 5 oz glass of wine (148 mL), or one 1 oz glass of hard liquor (44 mL).   Lifestyle  Take daily care of your teeth and gums. Brush your teeth every morning and night with fluoride toothpaste. Floss one time each day.  Stay active. Exercise for at least 30 minutes 5 or more days each week.  Do not use any products that contain nicotine or tobacco, such as cigarettes, e-cigarettes, and chewing tobacco. If you need help quitting, ask your health care provider.  Do not use drugs.  If you are sexually active, practice safe sex. Use a condom or other form of protection to prevent STIs (sexually transmitted infections).  If you do not wish to become pregnant, use a form of birth control. If you plan to become pregnant, see your health care provider for a prepregnancy visit.  If told by your health care provider, take low-dose aspirin daily starting at age 70.  Find healthy ways to cope with stress, such as: ? Meditation, yoga, or listening to music. ? Journaling. ? Talking to a trusted person. ? Spending time with friends and family. Safety  Always wear your seat belt while driving or riding in a vehicle.  Do not drive: ? If you have been drinking alcohol. Do not ride with someone who has been drinking. ? When you are tired or distracted. ? While texting.  Wear a helmet and other protective equipment during sports activities.  If you have firearms in your house, make sure you follow all gun safety procedures. What's next?  Visit your health care provider once a year for an annual wellness visit.  Ask your health care provider how often  you should have your eyes and teeth checked.  Stay up to date on all vaccines. This information is not intended to replace advice given to you by your health care provider. Make sure you discuss any questions you have with your health care provider. Document Revised: 12/20/2019 Document Reviewed: 11/26/2017 Elsevier Patient Education  2021 [employer].

## 2020-08-24 LAB — CELIAC PNL 2 RFLX ENDOMYSIAL AB TTR
(tTG) Ab, IgA: <1 U/mL
(tTG) Ab, IgG: <1 U/mL
Endomysial Ab IgA: NEGATIVE
Gliadin IgA: <1 U/mL
Gliadin IgG: <1 U/mL
Immunoglobulin A: 145 mg/dL (ref 47–310)

## 2020-08-24 LAB — FOOD ALLERGY PROFILE
Allergen, Salmon, f41: <0.1 kU/L
Almonds: <0.1 kU/L
CLASS: 0
CLASS: 0
CLASS: 0
CLASS: 0
CLASS: 0
CLASS: 0
CLASS: 0
CLASS: 0
CLASS: 0
CLASS: 0
CLASS: 0
Cashew IgE: <0.1 kU/L
Class: 0
Class: 0
Class: 0
Class: 0
Egg White IgE: <0.1 kU/L
Fish Cod: <0.1 kU/L
Hazelnut: <0.1 kU/L
Milk IgE: <0.1 kU/L
Peanut IgE: <0.1 kU/L
Scallop IgE: <0.1 kU/L
Sesame Seed f10: <0.1 kU/L
Shrimp IgE: <0.1 kU/L
Soybean IgE: <0.1 kU/L
Tuna IgE: <0.1 kU/L
Walnut: <0.1 kU/L
Wheat IgE: <0.1 kU/L

## 2020-08-24 LAB — HEPATITIS C ANTIBODY
Hepatitis C Ab: NONREACTIVE
SIGNAL TO CUT-OFF: 0 (ref <1.00)

## 2020-08-24 LAB — HIV ANTIBODY (ROUTINE TESTING W REFLEX): HIV 1&2 Ab, 4th Generation: NONREACTIVE

## 2020-08-24 LAB — INTERPRETATION:

## 2020-08-29 ENCOUNTER — Encounter: Payer: Self-pay | Admitting: Gastroenterology

## 2020-09-05 ENCOUNTER — Encounter: Payer: Self-pay | Admitting: Primary Care

## 2020-10-08 ENCOUNTER — Encounter: Payer: Self-pay | Admitting: Internal Medicine

## 2020-10-08 ENCOUNTER — Ambulatory Visit (INDEPENDENT_AMBULATORY_CARE_PROVIDER_SITE_OTHER): Payer: 59 | Admitting: Internal Medicine

## 2020-10-08 ENCOUNTER — Other Ambulatory Visit: Payer: Self-pay

## 2020-10-08 ENCOUNTER — Ambulatory Visit (INDEPENDENT_AMBULATORY_CARE_PROVIDER_SITE_OTHER): Payer: 59

## 2020-10-08 VITALS — BP 124/72 | HR 81 | Temp 97.9°F | Ht 70.0 in | Wt 208.4 lb

## 2020-10-08 DIAGNOSIS — Z148 Genetic carrier of other disease: Secondary | ICD-10-CM | POA: Diagnosis not present

## 2020-10-08 NOTE — Patient Instructions (Signed)
The patient should have follow up scheduled with myself in 6 months.   Prior to next visit patient should have: Full set of PFTs Chest xray

## 2020-10-08 NOTE — Progress Notes (Signed)
Elaine [name]    [id number]    23-Nov-1975  Primary Care Physician:Clark, Leticia Penna, NP  Referring Physician: Pleas Koch, NP [place] [address] [place],  [place] [address] Reason for Consultation: A1At deficiency Date of Consultation: 10/08/2020  Chief complaint:   Chief Complaint  Patient presents with   Consult     HPI: Elaine [name] is a woman with A1AT deficiency and pectus excavatum. Here for progressive symptoms of chest pain and shortness of breath. Notes that her oxygen levels have been in the high 80s with rest, but that She walks 3-4 miles a day and her O2 saturations are 99-100%.    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 Gets thrush and bone pain with advair. Thinks it might have helped in the past.  Feels that she has a harder time getting a deep breath in. Denies wheezing or cough. No recurrent bronchitis. Had recurrent bronchitis as a child. As an adult used to get pneumonia 1-2 times/year, but none since 2014.    Patient is estranged from parents, mother is a Ship broker and the patient grew up in foster care. She also had custody of her brother.  Matneral Grandmother was a smoker and died from lung cancer.  family history of multiple carriers in the family with A1AT heterozygous Father was a heavy smoker with multiple strokes and heart attacks Mother is a carrier (MZ) and is on oxygen Brother, Sister never been tested.  Uncle was MZ and her uncle's grandson is also MZ.  Uncle had a heart lung transplant in 1980s and never woke up from surgery.      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  Smoking history:  Social History   Occupational History   Not on file  Tobacco Use   Smoking status: Former    Pack years: 0.00   Smokeless tobacco: Never  Substance and Sexual  Activity   Alcohol use: Yes   Drug use: Not on file   Sexual activity: Not on file    Relevant family history: As above - multiple MZ A1AT carriers Family History  Problem Relation Age of Onset   Stroke Father 59   Heart attack Father 43   Hyperlipidemia Father     Past Medical History:  Diagnosis Date   Alpha-1-antitrypsin deficiency ([place])    Diverticulitis    with sepsis   Diverticulosis    Fibroids    Vaginal   GERD (gastroesophageal reflux disease)    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 Gen:      No acute distress ENT:  no nasal polyps, mucus membranes moist Lungs:    No increased respiratory effort, symmetric chest wall excursion, clear to auscultation bilaterally, no wheezes or crackles,  mild pectus excavatum. CV:         Regular rate and rhythm; no murmurs, rubs, or gallops.  No pedal edema Abd:      + bowel sounds; soft, non-tender; no distension MSK: no acute synovitis of DIP or PIP joints, no mechanics hands.  Skin:      Warm and dry; no rashes Neuro: normal speech, no focal facial asymmetry Psych: alert and oriented x3, normal mood and affect   Data Reviewed/Medical Decision Making:  Independent interpretation of tests: Imaging:  Review of patient's chest CT Jan 2020 revealed no emphysema, mild pectus excavatum. The patient's images have been independently reviewed by me.    PFTs: I have personally reviewed the patient's PFTs and they show mild restriction to ventilation, normal spirometry. PFT Results Latest Ref Rng &  Units 02/06/2017  FVC-Pre L 3.57  FVC-Predicted Pre % 79  FVC-Post L 3.60  FVC-Predicted Post % 80  Pre FEV1/FVC % % 77  Post FEV1/FCV % % 81  FEV1-Pre L 2.75  FEV1-Predicted Pre % 75  FEV1-Post L 2.91  DLCO uncorrected ml/min/mmHg 23.87  DLCO UNC% % 73  DLCO corrected ml/min/mmHg 22.27  DLCO COR %Predicted % 68  DLVA Predicted % 80  TLC L 5.32  TLC % Predicted % 88  RV % Predicted % 76    Labs:  Lab Results  Component Value Date   WBC 9.3 08/09/2019   HGB 13.2 08/09/2019   HCT 39.3 08/09/2019   MCV 90.3 08/09/2019   PLT 322.0 08/09/2019   A1AT level in 2018 was 93.  Immunization status:  Immunization History  Administered Date(s) Administered   Hepatitis B 05/30/1990   Influenza, Seasonal, Injecte, Preservative Fre 11/28/2016   Influenza,inj,Quad PF,6+ Mos 02/14/2015, 11/28/2016, 12/30/2017, 11/30/2018   Influenza,inj,quad, With Preservative 02/14/2015   Influenza-Unspecified 01/30/2015   MMR 05/30/1990   PFIZER(Purple Top)SARS-COV-2 Vaccination 06/16/2019, 07/07/2019, 12/23/2019, 06/29/2020   Pneumococcal-Unspecified 03/31/2013   Tdap 09/28/2013     I reviewed prior external note(s) from pulmonary, pcp, cardiology  I reviewed the result(s) of the labs and imaging as noted above.   I have ordered PFT  Assessment:  Shortness of breath A1AT heterozygote MZ Pectus Excavatum  Plan/Recommendations:  Elaine [name] has extensive family history of A1AT heterozygote. Unclear if her current symptoms are related to emphysema, more likely related to pectus excavatum with worsening restriction.   CMP reviewed this year and her LFTs are within normal limits, low suspicion for liver involvement.   Will obtain full set of PFTs and repeat Chest xray today. Would defer inhaler therapy until we have a more clear idea of what's going on, especially since she had side effects to therapy.   We discussed disease management and progression at length today.    Return to Care: Return  in about 6 months (around 04/10/2021).  Lenice Llamas, MD [facility] [place]  CC: Pleas Koch, NP

## 2020-10-09 ENCOUNTER — Other Ambulatory Visit: Payer: Self-pay

## 2020-10-09 ENCOUNTER — Ambulatory Visit (INDEPENDENT_AMBULATORY_CARE_PROVIDER_SITE_OTHER): Payer: 59 | Admitting: Internal Medicine

## 2020-10-09 DIAGNOSIS — Z148 Genetic carrier of other disease: Secondary | ICD-10-CM | POA: Diagnosis not present

## 2020-10-09 LAB — PULMONARY FUNCTION TEST
DL/VA % pred: 122 %
DL/VA: 5.14 ml/min/mmHg/L
DLCO cor % pred: 92 %
DLCO cor: 24.38 ml/min/mmHg
DLCO unc % pred: 92 %
DLCO unc: 24.38 ml/min/mmHg
FEF 25-75 Post: 3.12 L/sec
FEF 25-75 Pre: 2.22 L/sec
FEF2575-%Change-Post: 40 %
FEF2575-%Pred-Post: 91 %
FEF2575-%Pred-Pre: 65 %
FEV1-%Change-Post: 9 %
FEV1-%Pred-Post: 77 %
FEV1-%Pred-Pre: 70 %
FEV1-Post: 2.76 L
FEV1-Pre: 2.51 L
FEV1FVC-%Change-Post: 2 %
FEV1FVC-%Pred-Pre: 96 %
FEV6-%Change-Post: 7 %
FEV6-%Pred-Post: 78 %
FEV6-%Pred-Pre: 73 %
FEV6-Post: 3.43 L
FEV6-Pre: 3.2 L
FEV6FVC-%Pred-Post: 102 %
FEV6FVC-%Pred-Pre: 102 %
FVC-%Change-Post: 7 %
FVC-%Pred-Post: 76 %
FVC-%Pred-Pre: 71 %
FVC-Post: 3.43 L
FVC-Pre: 3.2 L
Post FEV1/FVC ratio: 81 %
Post FEV6/FVC ratio: 100 %
Pre FEV1/FVC ratio: 78 %
Pre FEV6/FVC Ratio: 100 %
RV % pred: 89 %
RV: 1.74 L
TLC % pred: 87 %
TLC: 5.26 L

## 2020-10-09 NOTE — Progress Notes (Signed)
Full PFT performed today. °

## 2020-10-09 NOTE — Patient Instructions (Signed)
Full PFT performed today. °

## 2020-10-10 ENCOUNTER — Encounter: Payer: Self-pay | Admitting: Gastroenterology

## 2020-10-10 ENCOUNTER — Ambulatory Visit: Payer: 59 | Admitting: Gastroenterology

## 2020-10-10 VITALS — BP 134/88 | HR 88 | Ht 71.0 in | Wt 209.0 lb

## 2020-10-10 DIAGNOSIS — Z8719 Personal history of other diseases of the digestive system: Secondary | ICD-10-CM

## 2020-10-10 DIAGNOSIS — K5900 Constipation, unspecified: Secondary | ICD-10-CM | POA: Diagnosis not present

## 2020-10-10 DIAGNOSIS — R1030 Lower abdominal pain, unspecified: Secondary | ICD-10-CM

## 2020-10-10 MED ORDER — SENNA 8.6 MG PO TABS: 1.0000 | ORAL_TABLET | Freq: Every day | ORAL | 0 refills | Status: DC | PRN

## 2020-10-10 NOTE — Progress Notes (Signed)
HPI : Elaine [name] is a pleasant 45 year old female referred by Alma Friendly, NP for further evaluation of constipation and abdominal pain.  The patient reports having problems with constipation and abdominal pain ever since she was a child.  She had her first colonoscopy at age 38 to evaluate this abdominal pain, and was noted to have diverticulosis.  At age 20, she was hospitalized with complicated left-sided diverticulitis and underwent a sigmoidectomy.  She spent 3 months in the hospital recovering. Currently, her constipation is fairly well managed with diet alone and she has a bowel movement most every day.  However, during her menstrual periods her constipation significantly worsens, and she will often not have a bowel movement for that entire week.  She has tried taking over-the-counter laxatives but they have been limited by ineffectiveness or side effects (patient not able to state which medications caused which side effects or which were ineffective).  Diarrhea is never a problem for her. The patient states that she had previous labs which indicated a "gluten allergy".  She started a gluten-free diet in November 2021.  She states that she was strictly adherent to this diet until June of this year.  While on a gluten-free diet, the patient states that her bloating and abdominal pain significantly improved and she did feel better overall.  However, she is tired of the stress and anxiety that adhering to a gluten-free diet creates, especially when going out to eat with other people.  Therefore, she elected to resume a regular diet a few weeks ago.  She admits that she does feel worse now than when she was on the gluten-free diet.  She also recalls periods in which she would break out in hives when eating, but this is only with eating out.  The patient feels that this may have been a stress related reaction.  Her weight has fluctuated without sustained weight loss.  She denies any family history of  celiac disease.  She denies symptoms consistent with dermatitis herpetiformis.  No history of recurrent oral ulcers. In May her primary provider checked a celiac panel which was negative.  However, the patient had been on a gluten-free diet from 6 months when this test was drawn.   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[place])    No compelte work up, but suspected   Hyperlipidemia    Hypothyroidism    Malignant hyperthermia    Psoriasis    Ruptured ovarian cyst 2004,2016   Seizures ([place])      Past Surgical History:  Procedure Laterality Date   DENTAL SURGERY     LAPAROSCOPIC SIGMOID COLECTOMY  2013   Family History  Problem Relation Age of Onset   Stroke Father 66   Heart attack Father 53   Hyperlipidemia Father    Stomach cancer Neg Hx    Colon polyps Neg Hx    Esophageal cancer Neg Hx    Pancreatic cancer Neg Hx    Social History   Tobacco Use   Smoking status: Former    Pack years: 0.00   Smokeless tobacco: Never  Vaping Use   Vaping Use: Never used  Substance Use Topics   Alcohol use: Yes   Drug use: Never  Current Outpatient Medications  Medication Sig Dispense Refill   cholecalciferol (VITAMIN D3) 25 MCG (1000 UNIT) tablet Take 1,000 Units by mouth daily.     fluticasone (FLONASE) 50 MCG/ACT nasal spray Place 2 sprays into both nostrils daily.     MAGNESIUM PO Take 500 mg by mouth daily.     Melatonin 5 MG TABS Take 15 mg by mouth.     mometasone (ELOCON) 0.1 % cream Apply 1 application topically daily. 45 g 0   mupirocin ointment (BACTROBAN) 2 % Apply 1 application topically 2 (two) times daily. 30 g 0   Omega-3 Fatty Acids (FISH OIL) 500 MG CAPS Take 2 capsules by mouth daily.     rosuvastatin (CRESTOR) 5 MG tablet Take 1 tablet (5 mg total) by mouth  daily. 90 tablet 3   senna (SENOKOT) 8.6 MG TABS tablet Take 1 tablet (8.6 mg total) by mouth daily as needed for mild constipation. 120 tablet 0   tranexamic acid (LYSTEDA) 650 MG TABS tablet Take 1 tablet by mouth as directed.     ZOLMitriptan (ZOMIG) 2.5 MG tablet Take 1 tablet by mouth at migraine onset. May repeat in 2 hours if headache persists or recurs. 10 tablet 0   No current facility-administered medications for this visit.   Allergies  Allergen Reactions   Anesthetics, Amide Anaphylaxis   Other Anaphylaxis    Other reaction(s): Unknown   Cephalexin    Dilaudid  [Hydromorphone Hcl] Nausea And Vomiting   Penicillins      Review of Systems: All systems reviewed and negative except where noted in HPI.    DG Chest 2 View  Result Date: 10/09/2020 CLINICAL DATA:  Alpha 1 antitrypsin deficiency with progressive symptoms. EXAM: CHEST - 2 VIEW COMPARISON:  02/05/2017 FINDINGS: Pectus deformity is identified with extension way shin of the right heart border. The heart size and mediastinal contours are within normal limits. Both lungs are clear. IMPRESSION: No active cardiopulmonary abnormalities. Electronically Signed   By: Kerby Moors M.D.   On: 10/09/2020 11:13    Physical Exam: BP 134/88   Pulse 88   Ht 5\' 11"  (1.803 m)   Wt 209 lb (94.8 kg)   BMI 29.15 kg/m  Constitutional: Pleasant,well-developed, Caucasian female in no acute distress. HEENT: Normocephalic and atraumatic. Conjunctivae are normal. No scleral icterus. Neck supple.  Cardiovascular: Normal rate, regular rhythm.  Pulmonary/chest: Pectus excavatum, effort normal and breath sounds normal. No wheezing, rales or rhonchi. Abdominal: Soft, nondistended, tenderness to palpation in the LLQ, no guarding or rigidity. Bowel sounds active throughout. There are no masses palpable. No hepatomegaly. Extremities: no edema Neurological: Alert and oriented to person place and time. Skin: Skin is warm and dry. No rashes  noted. Psychiatric: Normal mood and affect. Behavior is normal.  CBC    Component Value Date/Time   WBC 9.3 08/09/2019 1143   RBC 4.35 08/09/2019 1143   HGB 13.2 08/09/2019 1143   HCT 39.3 08/09/2019 1143   PLT 322.0 08/09/2019 1143   MCV 90.3 08/09/2019 1143   MCHC 33.5 08/09/2019 1143   RDW 13.9 08/09/2019 1143    CMP     Component Value Date/Time   NA 135 04/04/2020 0748   K 4.2 04/04/2020 0748   CL 102 04/04/2020 0748   CO2 21 04/04/2020 0748   GLUCOSE 97 04/04/2020 0748   GLUCOSE 99 08/09/2019 1143   BUN 10 04/04/2020 0748   CREATININE 0.78 04/04/2020 0748   CALCIUM 9.6 04/04/2020 0748   PROT  6.4 07/09/2020 0839   ALBUMIN 4.4 07/09/2020 0839   AST 11 07/09/2020 0839   ALT 13 07/09/2020 0839   ALKPHOS 76 07/09/2020 0839   BILITOT 0.9 07/09/2020 0839   GFRNONAA 93 04/04/2020 0748   GFRAA 108 04/04/2020 0748     ASSESSMENT AND PLAN: 45 year old female with lifelong history of abdominal pain and constipation, with history of complicated sigmoid diverticulitis status post sigmoidectomy, with clinical history of gluten intolerance and improvement of symptoms on a gluten-free diet, but with negative celiac serologies confounded by patient on a gluten-free diet at the time of testing.  We discussed what celiac disease is versus gluten intolerance or "nonceliac gluten sensitivity" and the ramifications of continued gluten exposure in one case versus the other.  The patient would prefer not to have to be confined to a gluten-free diet indefinitely even though she feels better on a gluten-free diet.  She would like to know if she truly does have celiac disease or not.  Do this, she would need to undergo a gluten challenge.  Patient was agreeable to this.  We will plan for gluten challenge for 2 weeks and repeat the serologies.  Abdominal pain/constipation/gluten intolerance - Gluten challenge for 2 weeks (2 slices of whole-wheat bread every day for 2 weeks) - Repeat  comprehensive celiac panel at end of 2 weeks -If celiac panel negative, patient most likely has IBS/gluten intolerance.  She can follow-up in the GI clinic to discuss further management of symptoms with or without a gluten-free diet -In the meantime, I recommended she try MiraLAX 1-3 times per day and senna as needed to help manage her constipation while on her gluten challenge and during her menstrual periods  History of complicated diverticulitis - Her history of complicated diverticulitis and sigmoid resection does not increase her risk for colon cancer and given the absence of any other red flag symptoms, she should not need another colonoscopy until age 48 for average risk colon cancer screening   Carlis Abbott, Leticia Penna, NP

## 2020-10-10 NOTE — Patient Instructions (Signed)
If you are age 44 or older, your body mass index should be between 23-30. Your Body mass index is 29.15 kg/m. If this is out of the aforementioned range listed, please consider follow up with your Primary Care Provider.  If you are age 16 or younger, your body mass index should be between 19-25. Your Body mass index is 29.15 kg/m. If this is out of the aformentioned range listed, please consider follow up with your Primary Care Provider.   Please complete gluten challenge. Eat wheat every day for 2 weeks and come back for labs.  The [facility] providers would like to encourage you to use [facility] to communicate with providers for non-urgent requests or questions.  Due to long hold times on the telephone, sending your provider a message by [facility] may be a faster and more efficient way to get a response.  Please allow 48 business hours for a response.  Please remember that this is for non-urgent requests.   It was a pleasure to see you today!  Thank you for trusting me with your gastrointestinal care!    Scott E. Candis Schatz, MD

## 2020-10-15 ENCOUNTER — Telehealth: Payer: Self-pay | Admitting: *Deleted

## 2020-10-15 DIAGNOSIS — Z8719 Personal history of other diseases of the digestive system: Secondary | ICD-10-CM | POA: Insufficient documentation

## 2020-10-15 NOTE — Telephone Encounter (Signed)
Will defer to [facility]. Anticipate she would need an appointment but may be a referral depending on what Allie Bossier was thinking about.

## 2020-10-15 NOTE — Telephone Encounter (Signed)
Patient called stating that she is being treated for migraines. Patient stated that she has had 4 migraines in the past 3 weeks. Patient stated that she had the worse one last night that she thinks that she has ever had. Patient stated that it took about 2 1/2 hours for the Zomig to work. Patient stated that Allie Bossier NP had mentions that there are newer medications on the market now for migraines and preventative medications also. Patient stated that she is interested in proceeding with other medications. Patient wants to know if she needs to come back in to discuss other treatments.

## 2020-10-16 NOTE — Telephone Encounter (Signed)
If she can come in it would be better so we can discuss options. Let me know.

## 2020-10-16 NOTE — Telephone Encounter (Signed)
Called patient have added on schedule for tomorrow. Ok per [facility].

## 2020-10-17 ENCOUNTER — Other Ambulatory Visit: Payer: Self-pay

## 2020-10-17 ENCOUNTER — Encounter: Payer: Self-pay | Admitting: Primary Care

## 2020-10-17 ENCOUNTER — Ambulatory Visit: Payer: 59 | Admitting: Primary Care

## 2020-10-17 VITALS — BP 126/84 | HR 88 | Temp 98.0°F | Ht 71.0 in | Wt 208.0 lb

## 2020-10-17 DIAGNOSIS — G43109 Migraine with aura, not intractable, without status migrainosus: Secondary | ICD-10-CM

## 2020-10-17 MED ORDER — ZOLMITRIPTAN 5 MG PO TABS
ORAL_TABLET | ORAL | 0 refills | Status: DC
Start: 2020-10-17 — End: 2021-04-02

## 2020-10-17 MED ORDER — KETOROLAC TROMETHAMINE 60 MG/2ML IM SOLN
60.0000 mg | Freq: Once | INTRAMUSCULAR | Status: AC
  Administered 2020-10-17: 60 mg via INTRAMUSCULAR

## 2020-10-17 NOTE — Patient Instructions (Signed)
We increased the dose of your Zomig to 5 mg. Take 1 tablet by mouth at migraine onset, may repeat 2 hours later if migraine persists. Do not exceed 2 tablets in 24 hours.  I will be in touch regarding Propranolol initiation for migraine prevention.   It was a pleasure to see you today!

## 2020-10-17 NOTE — Assessment & Plan Note (Signed)
Frequent migraines, worsening intensity.  Cannot tolerate Topamax and prefers to avoid amitriptyline.  Will contact pulmonologist to see if Propranolol ER 80 mg would be acceptable given her pulmonary issues. Will await, if approved then will send in Rx.  We increased the dose of her Zomig to 5 mg. discussed this today.   IM Toradol 60 mg provided for migraine developing today.

## 2020-10-17 NOTE — Progress Notes (Signed)
Subjective:    Patient ID: Elaine [name], female    DOB: 02-May-1975, 45 y.o.   MRN: [id number]  HPI  Elaine [name] is a very pleasant 45 y.o. female with a history of migraines, hypothyroidism who presents today to discuss migraines.   Migraines occurring 3-4 times monthly. She's had four migraines within the last week, worst migraine was three days ago, Zomig and OTC treatment wasn't helping.   She was once on Topamax for prevention, took for about one year, helped with migraine prevention, but struggled with side effects such as lost feeling in her knees/feet, decreased memory, altered taste. She was managed on amitriptyline, gained 30 pounds, this was not effective for migraines.    She is managed on Zomig 2.5 mg PRN, has often required a second tablet 2 hours later, sometimes little improvement. She's tried Imitrex in the past which caused palpitations.   She has a history of alpha-1 antitrypsin and pectus excavatum, follows with pulmonology, last visit a few weeks ago, underwent PTF's and chest xray.   She's never seen neurology or a headache specialist. She has a migraine developing behind her right eye today.     Review of Systems  Eyes:  Positive for photophobia.  Cardiovascular:  Negative for chest pain.  Neurological:  Positive for headaches.        Past Medical History:  Diagnosis Date   Alpha-1-antitrypsin deficiency ([place])    Arthritis    Bulging lumbar disc    Diverticulitis    with sepsis   Diverticulosis    Fibroids    Vaginal   GERD (gastroesophageal reflux disease)    Giantism ([facility])    No compelte work up, but suspected   Hyperlipidemia    Hypothyroidism    Malignant hyperthermia    Psoriasis    Ruptured ovarian cyst 2004,2016   Seizures ([place])     Social History   Socioeconomic History   Marital status: Married    Spouse name: Not on file   Number of children: Not on file   Years of education: Not on file   Highest education level: Not on  file  Occupational History   Not on file  Tobacco Use   Smoking status: Former   Smokeless tobacco: Never  Vaping Use   Vaping Use: Never used  Substance and Sexual Activity   Alcohol use: Yes   Drug use: Never   Sexual activity: Yes  Other Topics Concern   Not on file  Social History Narrative   Married.   1 child.   Works as a Psychologist, sport and exercise.    Social Determinants of Health   Financial Resource Strain: Not on file  Food Insecurity: Not on file  Transportation Needs: Not on file  Physical Activity: Not on file  Stress: Not on file  Social Connections: Not on file  Intimate Partner Violence: Not on file    Past Surgical History:  Procedure Laterality Date   DENTAL SURGERY     LAPAROSCOPIC SIGMOID COLECTOMY  2013    Family History  Problem Relation Age of Onset   Stroke Father 72   Heart attack Father 49   Hyperlipidemia Father    Stomach cancer Neg Hx    Colon polyps Neg Hx    Esophageal cancer Neg Hx    Pancreatic cancer Neg Hx     Allergies  Allergen Reactions   Anesthetics, Amide Anaphylaxis   Other Anaphylaxis    Other reaction(s): Unknown   Cephalexin  Dilaudid  [Hydromorphone Hcl] Nausea And Vomiting   Penicillins     Current Outpatient Medications on File Prior to Visit  Medication Sig Dispense Refill   cholecalciferol (VITAMIN D3) 25 MCG (1000 UNIT) tablet Take 1,000 Units by mouth daily.     fluticasone (FLONASE) 50 MCG/ACT nasal spray Place 2 sprays into both nostrils daily.     Loratadine-Pseudoephedrine (CLARITIN-D 24 HOUR PO) Take by mouth.     MAGNESIUM PO Take 500 mg by mouth daily.     Melatonin 5 MG TABS Take 15 mg by mouth.     mometasone (ELOCON) 0.1 % cream Apply 1 application topically daily. 45 g 0   mupirocin ointment (BACTROBAN) 2 % Apply 1 application topically 2 (two) times daily. 30 g 0   Omega-3 Fatty Acids (FISH OIL) 500 MG CAPS Take 2 capsules by mouth daily.     rosuvastatin (CRESTOR) 5 MG tablet Take 1 tablet (5 mg total)  by mouth daily. 90 tablet 3   senna (SENOKOT) 8.6 MG TABS tablet Take 1 tablet (8.6 mg total) by mouth daily as needed for mild constipation. 120 tablet 0   tranexamic acid (LYSTEDA) 650 MG TABS tablet Take 1 tablet by mouth as directed.     ZOLMitriptan (ZOMIG) 2.5 MG tablet Take 1 tablet by mouth at migraine onset. May repeat in 2 hours if headache persists or recurs. 10 tablet 0   No current facility-administered medications on file prior to visit.    BP 126/84   Pulse 88   Temp 98 F (36.7 C) (Temporal)   Ht 5\' 11"  (1.803 m)   Wt 208 lb (94.3 kg)   SpO2 99%   BMI 29.01 kg/m  Objective:   Physical Exam Cardiovascular:     Rate and Rhythm: Normal rate and regular rhythm.  Pulmonary:     Effort: Pulmonary effort is normal.     Breath sounds: Normal breath sounds.  Musculoskeletal:     Cervical back: Neck supple.  Skin:    General: Skin is warm and dry.          Assessment & Plan:      This visit occurred during the SARS-CoV-2 public health emergency.  Safety protocols were in place, including screening questions prior to the visit, additional usage of staff PPE, and extensive cleaning of exam room while observing appropriate contact time as indicated for disinfecting solutions.

## 2020-10-23 ENCOUNTER — Telehealth: Payer: Self-pay | Admitting: *Deleted

## 2020-10-23 LAB — CYTOLOGY - PAP: Pap: NEGATIVE

## 2020-10-23 NOTE — Telephone Encounter (Signed)
Patient is requesting a new GYN - pt is not satisfied with Dr Pamala Hurry and it wanting to leave the practice all together.   Pt requesting Female Provider; Perimenopause/menopause specialty.  I gave her the information for the [place] [facility] in [place].  She states that she does not need a referral per her insurance.  Pt is going to call and get a new patient appt. Will let us know if they need anything from [place].   FYI  Nothing further needed.

## 2020-10-23 NOTE — Telephone Encounter (Signed)
Noted  

## 2020-10-25 ENCOUNTER — Other Ambulatory Visit: Payer: 59

## 2020-10-25 DIAGNOSIS — R1030 Lower abdominal pain, unspecified: Secondary | ICD-10-CM

## 2020-10-25 DIAGNOSIS — K5900 Constipation, unspecified: Secondary | ICD-10-CM

## 2020-11-02 LAB — CELIAC PNL 2 RFLX ENDOMYSIAL AB TTR
(tTG) Ab, IgA: <1 U/mL
(tTG) Ab, IgG: <1 U/mL
Endomysial Ab IgA: NEGATIVE
Gliadin IgA: <1 U/mL
Gliadin IgG: <1 U/mL
Immunoglobulin A: 137 mg/dL (ref 47–310)

## 2020-11-07 NOTE — Progress Notes (Signed)
Elaine [name],  Please inform Elaine [name] that all her antibodies were again negative for celiac disease.  She does not have to follow a gluten free diet and will not be at risk of health consequences if she consumes gluten.  However, she may continue a gluten free diet if she desires, given that her symptoms are better on a gluten free diet.  She can follow up with me in clinic to further discuss this and to help further manage her chronic GI symptoms.

## 2020-12-26 ENCOUNTER — Ambulatory Visit: Payer: 59 | Admitting: Family Medicine

## 2020-12-26 ENCOUNTER — Encounter: Payer: Self-pay | Admitting: Family Medicine

## 2020-12-26 ENCOUNTER — Other Ambulatory Visit: Payer: Self-pay

## 2020-12-26 VITALS — BP 148/86 | HR 82 | Wt 209.4 lb

## 2020-12-26 DIAGNOSIS — N83209 Unspecified ovarian cyst, unspecified side: Secondary | ICD-10-CM | POA: Diagnosis not present

## 2020-12-26 DIAGNOSIS — G43109 Migraine with aura, not intractable, without status migrainosus: Secondary | ICD-10-CM

## 2020-12-26 DIAGNOSIS — Z23 Encounter for immunization: Secondary | ICD-10-CM

## 2020-12-26 DIAGNOSIS — N939 Abnormal uterine and vaginal bleeding, unspecified: Secondary | ICD-10-CM

## 2020-12-26 DIAGNOSIS — T883XXS Malignant hyperthermia due to anesthesia, sequela: Secondary | ICD-10-CM | POA: Diagnosis not present

## 2020-12-26 MED ORDER — SLYND 4 MG PO TABS: 1.0000 | ORAL_TABLET | Freq: Every day | ORAL | 3 refills | Status: DC

## 2020-12-26 NOTE — Progress Notes (Signed)
Subjective:    Patient ID: Elaine [name] is a 45 y.o. female presenting with No chief complaint on file.  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 Menarche at age 59. Had 1-2 cycles/year. Then placed on Progesterone and then used clomid. Has had SVD x 1. Since then has had heavy cycles and come q 2 weeks. Has h/o migraines, better on Slynd - On since August. Has had u/s that showed a fibroid that was 8 cm. Due for another u/s. Wants a partial hysterectomy. Has h/o ovarian cysts, and some recent pain which radiates up. Has h/o malignant hyperthermia. SVD x 1 but h/o partial colectomy due to diverticulitis with a lot of adhesions.  Review of Systems  Constitutional:  Negative for chills and fever.  Respiratory:  Negative for shortness of breath.   Cardiovascular:  Negative for chest pain.  Gastrointestinal:  Negative for abdominal pain, nausea and vomiting.  Genitourinary:  Negative for dysuria.  Skin:  Negative for rash.     Objective:    BP (!) 148/86   Pulse 82   Ht (P) 5\' 10"  (1.778 m)   Wt 209 lb 6.4 oz (95 kg)   BMI (P) 30.05 kg/m  Physical Exam Constitutional:      General: She is not in acute distress.    Appearance: She is well-developed.  HENT:     Head: Normocephalic and atraumatic.  Eyes:     General: No scleral icterus. Cardiovascular:     Rate and Rhythm: Normal rate.  Pulmonary:     Effort: Pulmonary effort is normal.  Abdominal:     Palpations: Abdomen is soft.  Musculoskeletal:     Cervical back: Neck supple.  Skin:    General: Skin is warm and dry.  Neurological:     Mental Status: She is alert and oriented to person, place, and time.        Assessment & Plan:   Problem List Items Addressed This Visit       Unprioritized   Migraine with aura and without status migrainosus, not intractable    Better on Slynd--if we pursue another type of treatment of bleeding, this benefit will be lost.       Malignant hyperthermia    Has had a successful surgery without any issues      Abnormal uterine bleeding    Has known fibroids--better on Slynd, however, BP is up on this, will monitor. Not a candidate for COCs due to migraine with aura and BP. Partner with vasectomy, could consider ablation. Also might be a candidate for UFE or Sonata. Discussed all of these options. Will start with U/s and f/u in 1 month to see how bleeding is doing and BP is fairing and after u/s and discuss next steps.      Relevant Medications   SLYND 4 MG TABS   Other Relevant Orders   US PELVIC COMPLETE WITH TRANSVAGINAL   Ovarian cyst    Might improve with the Slynd as well.--check u/s      Other Visit Diagnoses     Needs flu shot    -  Primary   Relevant Orders   Flu Vaccine QUAD 45mo+IM (Fluarix, Fluzone & Alfiuria Quad PF) (Completed)       Total time in review of prior history including her prior attempts at controlling, prior u;s results, prior surgical history, prior family history, prior medical history, labs, history taking, review with patient  reasons for trepidation with surgery, exam, note writing, discussion of options for treatment of bleeding, cysts, headaches, risks of surgery with prior malignant hyperthermia, plan for next steps, including f/u u/s and alternatives and risks of treatment: 33 minutes.  Return in about 4 weeks (around 01/23/2021) for a follow-up with me only.  [name] 12/27/2020 7:56 AM

## 2020-12-26 NOTE — Progress Notes (Signed)
Patient declined IBH services at this time  Altha Harm, [place]

## 2020-12-27 DIAGNOSIS — E049 Nontoxic goiter, unspecified: Secondary | ICD-10-CM | POA: Insufficient documentation

## 2020-12-27 DIAGNOSIS — N939 Abnormal uterine and vaginal bleeding, unspecified: Secondary | ICD-10-CM

## 2020-12-27 DIAGNOSIS — E230 Hypopituitarism: Secondary | ICD-10-CM | POA: Insufficient documentation

## 2020-12-27 DIAGNOSIS — N83209 Unspecified ovarian cyst, unspecified side: Secondary | ICD-10-CM | POA: Insufficient documentation

## 2020-12-27 HISTORY — DX: Abnormal uterine and vaginal bleeding, unspecified: N93.9

## 2020-12-27 NOTE — Assessment & Plan Note (Signed)
Better on Slynd--if we pursue another type of treatment of bleeding, this benefit will be lost.

## 2020-12-27 NOTE — Assessment & Plan Note (Signed)
Has known fibroids--better on Slynd, however, BP is up on this, will monitor. Not a candidate for COCs due to migraine with aura and BP. Partner with vasectomy, could consider ablation. Also might be a candidate for UFE or Sonata. Discussed all of these options. Will start with U/s and f/u in 1 month to see how bleeding is doing and BP is fairing and after u/s and discuss next steps.

## 2020-12-27 NOTE — Assessment & Plan Note (Signed)
Has had a successful surgery without any issues

## 2020-12-27 NOTE — Assessment & Plan Note (Signed)
Might improve with the [facility] as well.--check u/s

## 2020-12-31 ENCOUNTER — Telehealth: Payer: Self-pay | Admitting: General Practice

## 2020-12-31 NOTE — Telephone Encounter (Signed)
Patient called and left message on nurse voicemail line stating she has an ultrasound appt on Wednesday but she has started her period. Patient is unsure if she needs to reschedule her appt.   Called patient back and she states her period started on Friday and is light to moderate. Discussed with patient if her bleeding remains on the lighter side she is fine to proceed with ultrasound otherwise I recommend she reschedule. Provided ultrasound's phone number. Patient verbalized understanding and asked if I would let Dr Kennon Rounds know she started her period. I stated I would.

## 2021-01-02 ENCOUNTER — Ambulatory Visit: Payer: 59

## 2021-01-07 ENCOUNTER — Ambulatory Visit
Admission: RE | Admit: 2021-01-07 | Discharge: 2021-01-07 | Disposition: A | Discharge status: Home / Self Care | Payer: 59 | Source: Ambulatory Visit | Attending: Endocrinology | Admitting: Endocrinology

## 2021-01-07 DIAGNOSIS — E042 Nontoxic multinodular goiter: Secondary | ICD-10-CM

## 2021-01-09 ENCOUNTER — Other Ambulatory Visit: Payer: 59

## 2021-01-14 ENCOUNTER — Other Ambulatory Visit: Payer: Self-pay

## 2021-01-14 ENCOUNTER — Ambulatory Visit
Admission: RE | Admit: 2021-01-14 | Discharge: 2021-01-14 | Disposition: A | Discharge status: Home / Self Care | Payer: 59 | Source: Ambulatory Visit | Attending: Family Medicine | Admitting: Family Medicine

## 2021-01-14 DIAGNOSIS — N939 Abnormal uterine and vaginal bleeding, unspecified: Secondary | ICD-10-CM | POA: Diagnosis present

## 2021-01-21 ENCOUNTER — Ambulatory Visit (INDEPENDENT_AMBULATORY_CARE_PROVIDER_SITE_OTHER): Payer: 59

## 2021-01-21 ENCOUNTER — Other Ambulatory Visit: Payer: Self-pay

## 2021-01-21 VITALS — BP 135/93 | HR 89 | Wt 209.3 lb

## 2021-01-21 DIAGNOSIS — Z0130 Encounter for examination of blood pressure without abnormal findings: Secondary | ICD-10-CM

## 2021-01-21 NOTE — Progress Notes (Signed)
Here today for blood pressure check following visit with Kennon Rounds, MD on 12/26/20. BP today is 135/93. Per chart review pt was supposed to follow up with Kennon Rounds in 4 weeks; no appointment on schedule.   Pt requests ultrasound results and provider recommendation. I explained that provider will need to review these results. Explained either Kennon Rounds, MD or one of the clinical staff will be in touch once these have been reviewed.   Pt requests that Kennon Rounds, MD be given update on bleeding and migraines. Reports LMP 12/28/20- 01/07/21 with spotting until 01/15/21. States she has had a migraine once a week. Also states she fell at 3 AM this morning. Pt is unsure why this happened, is concerned this may have been a result of syncope because she does not fully remember how she fell. Reports this fall exasperated ongoing lower back and left hip pain. Pt does report she has significantly decreased caloric intake because she has had difficulty with weight loss. Denies any other recent changes.  Apolonio Schneiders RN 01/21/21

## 2021-01-29 ENCOUNTER — Telehealth: Payer: Self-pay | Admitting: Family Medicine

## 2021-01-29 ENCOUNTER — Encounter: Payer: Self-pay | Admitting: Cardiovascular Disease

## 2021-01-29 NOTE — Telephone Encounter (Signed)
-----   Message from Annabell Howells, RN sent at 01/21/2021 12:00 PM EDT ----- I saw this patient for a blood pressure check today. It may be helpful for you to call her. I saw that you requested she follow up in 4 weeks with you, but wasn't scheduled.

## 2021-01-29 NOTE — Progress Notes (Signed)
Cardiology Office Note:    Date:  01/30/2021   ID:  Elaine [name], DOB [birth year], MRN [id number]  PCP:  Pleas Koch, NP  Cardiologist:  Mertie Moores, MD  Electrophysiologist:  None   Referring MD: Pleas Koch, NP   Chief Complaint  Patient presents with   Hyperlipidemia     Jan. 6, 2020   Elaine [name] is a 45 y.o. female with a strong family history of premature coronary artery disease.  We are asked to see her today for further evaluation by Pleas Koch, NP   She is seen with her husband, Thora Lance and father had significant CAD - ( smoked heavily)   She also has episodes of tachycardia  She had an episode of sepsis related to diverticulitis.   She just started exercising. 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   They farm,   ( chickens , ducks, turkeys, gardens ) Has gained 20 lbs this past year -     Started exercising this past Nov.    Walks, has alpha 1 antitripsin deficiency - is not on inhalers  Sees pulmonary   Non smoker   Has leg swelling in the evening    Jan. 7, 2021 Elaine [name] is seen for follow up of her hyperlipidemia .  She has a strong family history of coronary artery disease.  She has alpha-1 antitrypsin disorder and has not been on a statin medication. Her coronary calcium score is no (Jan. 6, 2020)   Has bilateral shoulder pain she relates to starting Zetia . Has been taking black seed oil with some relief.   Also had some diarrhea - has resolved.  .  Walks 30-45 minutes  A day .    Jan. 27, 2022: Elaine [name] is seen today for follow up of her HLD. 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  Is on Rosuvastatin   Has changed her diet,  Avoids gluten now   Encouraged her  to exercise more .   Nov. 2, 2022 Elaine [name] is seen for follow up of her  HLD, alpha 1 antitrypsin disorder  Doing well [place],  [place]   She had a syncopal episode at 3 am . 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   fluticasone (FLONASE) 50 MCG/ACT nasal spray Place 2 sprays into both nostrils daily.   Loratadine-Pseudoephedrine (CLARITIN-D 24 HOUR PO) Take by mouth.   MAGNESIUM PO Take 500 mg by mouth daily.   Melatonin 5 MG TABS Take 15 mg by mouth.   mometasone (ELOCON) 0.1 % cream Apply 1 application topically daily.   rosuvastatin (CRESTOR) 5 MG tablet Take 1 tablet (5 mg total) by mouth daily.   SLYND 4 MG TABS Take 1 tablet by mouth daily.   zolmitriptan (ZOMIG) 5 MG tablet Take 1 tablet by mouth at migraine onset, may repeat 2 hours later if migraine persists. Do not exceed 2 tablets in 24 hours.     Allergies:   Anesthetics, amide; Other; Cephalexin; Dilaudid  [hydromorphone hcl]; and Penicillins   Social History   Socioeconomic History   Marital status: Married    Spouse name: Not on file   Number  of children: Not on file   Years of education: Not on file   Highest education level: Not on file  Occupational History   Not on file  Tobacco Use   Smoking status: Former   Smokeless tobacco: Never  Vaping Use   Vaping Use: Never used  Substance and Sexual Activity   Alcohol use: Yes    Alcohol/week: 3.0 standard drinks    Types: 1 Glasses of wine, 2 Shots of liquor per week   Drug use: Never   Sexual activity: Yes  Other Topics Concern   Not on file  Social History Narrative   Married.   1 child.   Works as a Psychologist, sport and exercise.    Social Determinants of Health   Financial Resource Strain: Not on file  Food Insecurity: No Food Insecurity   Worried About Charity fundraiser in the Last Year: Never true   Ran Out of Food in the Last Year: Never true  Transportation Needs: No Transportation Needs   Lack of Transportation (Medical): No   Lack of Transportation (Non-Medical): No  Physical Activity: Not on file  Stress: Not on file  Social Connections: Not on file     Family History: The patient's family history includes Heart attack (age of onset: 60) in her father; Hyperlipidemia in her father; Prostate cancer in her father; Stroke (age of onset: 23) in her father. There is no history of Stomach cancer, Colon polyps, Esophageal cancer, or Pancreatic cancer.  ROS:   Please see the history of present illness.     All other systems reviewed and are negative.  EKGs/Labs/Other Studies Reviewed:        Recent Labs: 04/04/2020: BUN 10; Creatinine, Ser 0.78; Potassium 4.2; Sodium 135 07/09/2020: ALT 13 08/15/2020: TSH 1.47  Recent Lipid Panel    Component Value Date/Time   CHOL 273 (H) 04/04/2020 0748   TRIG 235 (H) 04/04/2020 0748   HDL 51 04/04/2020 0748   CHOLHDL 5.4 (H) 04/04/2020 0748   CHOLHDL 5 08/09/2019 1143   VLDL 42.8 (H) 08/09/2019 1143   LDLCALC 178 (H) 04/04/2020 0748   LDLDIRECT 156.0 08/09/2019 1143    Physical Exam:    Physical Exam: Blood pressure 134/82,  pulse 85, height 5\' 10"  (1.778 m), weight 207 lb 3.2 oz (94 kg), SpO2 99 %.  GEN:  Well nourished, well developed in no acute distress HEENT: Normal NECK: No JVD; No carotid bruits LYMPHATICS:  No lymphadenopathy CARDIAC: RRR , no murmurs, rubs, gallops RESPIRATORY:  Clear to auscultation without rales, wheezing or rhonchi  ABDOMEN: Soft, non-tender, non-distended MUSCULOSKELETAL:  No edema; No deformity  SKIN: Warm and dry NEUROLOGIC:  Alert and oriented x 3  EKG:   January 30, 2021: Normal sinus rhythm at 85.  No ST or T wave changes.  ASSESSMENT:    1. Syncope, unspecified syncope type     PLAN:     1.  Syncope: Elaine [name] had an episode of syncope/orthostatic hypotension.  She got up in the middle of the night to use the bathroom and then woke up on the floor several minutes later.  She has known episodes of orthostatic hypotension.  Will place a 14-day ZIO monitor on her.  I have encouraged her to hydrate better.  We will see her again in 6 months-sooner if she has another episode of syncope.     2.  Alpha 1 antitripsin deficiency .  MZ varient . .   3.  Hyperlipidemia:     Medication Adjustments/Labs and Tests Ordered: Current medicines are reviewed at length with the patient today.  Concerns regarding medicines are outlined above.  Orders Placed This Encounter  Procedures   LONG TERM MONITOR (3-14 DAYS)   EKG 12-Lead    No orders of the defined types were placed in this encounter.   Patient Instructions  Medication Instructions:  Your physician recommends that you continue on your current medications as directed. Please refer to the Current Medication list given to you today.  *If you need a refill on your cardiac medications before your next appointment, please call your pharmacy*   Lab Work: None If you have labs (blood work) drawn today and your tests are completely normal, you will receive your results only by: [place] (if you have MyChart) OR A  paper copy in the mail If you have any lab test that is abnormal or we need to change your treatment, we will call you to review the results.   Testing/Procedures: Your physician recommends that you wear a monitor for 14 days.   Follow-Up: At [facility], you and your health needs are our priority.  As part of our continuing mission to provide you with exceptional heart care, we have created designated Provider Care Teams.  These Care Teams include your primary Cardiologist (physician) and Advanced Practice Providers (APPs -  Physician Assistants and Nurse Practitioners) who all work together to provide you with the care you need, when you need it.  We recommend signing up for the patient portal called "MyChart".  Sign up information is provided on this After Visit Summary.  MyChart is used to connect with patients for Virtual Visits (Telemedicine).  Patients are able to view lab/test results, encounter notes, upcoming appointments, etc.  Non-urgent messages can be sent to your provider as well.   To learn more about what you can do with MyChart, go to [email or web address].    Your next appointment:   6 month(s)  The format for your next appointment:   In Person  Provider:   You may see Mertie Moores, MD or one of the following Advanced Practice Providers on your designated Care Team:   Richardson Dopp, PA-C Vin Bhagat, [place]   Other Instructions  [place] Monitor Instructions  Your physician has requested you wear a ZIO patch monitor for 14 days.  This is a single patch monitor. [employer] supplies one patch monitor per enrollment. Additional stickers are  not available. Please do not apply patch if you will be having a Nuclear Stress Test,  Echocardiogram, Cardiac CT, MRI, or Chest Xray during the period you would be wearing the  monitor. The patch cannot be worn during these tests. You cannot remove and re-apply the  ZIO XT patch monitor.  Your ZIO patch monitor will be  mailed 3 day USPS to your address on file. It may take 3-5 days  to receive your monitor after you have been enrolled.  Once you have received your monitor, please review the enclosed instructions. Your monitor  has already been registered assigning a specific monitor serial # to you.  Billing and Patient Assistance Program Information  We have supplied [employer] with any of your insurance information on file for billing purposes. [employer] offers a sliding scale Patient Assistance Program for patients that do not have  insurance, or whose insurance does not completely cover the cost of the ZIO monitor.  You must apply for the Patient Assistance Program to qualify for this discounted rate.  To apply, please call [employer] at [phone], select option 4, select option 2, ask to apply for  Patient Assistance Program. Theodore Demark will ask your household income, and how many people  are in your household. They will quote your out-of-pocket cost based on that information.  [employer] will also be able to set up a 46-month, interest-free payment plan if needed.  Applying the monitor   Shave hair from upper left chest.  Hold abrader disc by orange tab. Rub abrader in 40 strokes over the upper left chest as  indicated in your monitor instructions.  Clean area with 4 enclosed alcohol pads. Let dry.  Apply patch as indicated in monitor instructions. Patch will be placed under collarbone on left  side of chest with arrow pointing upward.  Rub patch adhesive wings for 2 minutes. Remove white label marked "1". Remove the white  label marked "2". Rub patch adhesive wings for 2 additional minutes.  While looking in a mirror, press and release button in center of patch. A small green light will  flash 3-4 times. This will be your only indicator that the monitor has been turned on.  Do not shower for the first 24 hours. You may shower after the first 24 hours.  Press the button if you feel a symptom. You will hear  a small click. Record Date, Time and  Symptom in the Patient Logbook.  When you are ready to remove the patch, follow instructions on the last 2 pages of Patient  Logbook. Stick patch monitor onto the last page of Patient Logbook.  Place Patient Logbook in the blue and white box. Use locking tab on box and tape box closed  securely. The blue and white box has prepaid postage on it. Please place it in the mailbox as  soon as possible. Your physician should have your test results approximately 7 days after the  monitor has been mailed back to [facility].  Call [place] at [phone] if you have questions regarding  your ZIO XT patch monitor. Call them immediately if you see an orange light blinking on your  monitor.  If your monitor falls off in less than 4 days, contact our Monitor department at [phone].  If your monitor becomes loose or falls off after 4 days call [employer] at [phone] for  suggestions on securing your monitor     Signed, Mertie Moores, MD  01/30/2021 5:26 PM    [facility]

## 2021-01-29 NOTE — Telephone Encounter (Signed)
Discussed options with patient. She does not want to continue the [facility] as she continues to have bleeding. She is interested in endometrial ablation. Will tentatively schedule. Will need anesthesia consult. Risks reviewed.  [name] 01/29/2021 9:46 AM

## 2021-01-30 ENCOUNTER — Ambulatory Visit (INDEPENDENT_AMBULATORY_CARE_PROVIDER_SITE_OTHER): Payer: 59

## 2021-01-30 ENCOUNTER — Other Ambulatory Visit: Payer: Self-pay

## 2021-01-30 ENCOUNTER — Ambulatory Visit: Payer: 59 | Admitting: Cardiovascular Disease

## 2021-01-30 ENCOUNTER — Encounter: Payer: Self-pay | Admitting: Cardiovascular Disease

## 2021-01-30 VITALS — BP 134/82 | HR 85 | Ht 70.0 in | Wt 207.2 lb

## 2021-01-30 DIAGNOSIS — R55 Syncope and collapse: Secondary | ICD-10-CM | POA: Diagnosis not present

## 2021-01-30 DIAGNOSIS — I951 Orthostatic hypotension: Secondary | ICD-10-CM | POA: Diagnosis not present

## 2021-01-30 NOTE — Progress Notes (Unsigned)
Patient enrolled for [employer] to mail a 14 day ZIO XT monitor to her address on file.

## 2021-01-30 NOTE — Patient Instructions (Signed)
Medication Instructions:  Your physician recommends that you continue on your current medications as directed. Please refer to the Current Medication list given to you today.  *If you need a refill on your cardiac medications before your next appointment, please call your pharmacy*   Lab Work: None If you have labs (blood work) drawn today and your tests are completely normal, you will receive your results only by: [place] (if you have MyChart) OR A paper copy in the mail If you have any lab test that is abnormal or we need to change your treatment, we will call you to review the results.   Testing/Procedures: Your physician recommends that you wear a monitor for 14 days.   Follow-Up: At [facility], you and your health needs are our priority.  As part of our continuing mission to provide you with exceptional heart care, we have created designated Provider Care Teams.  These Care Teams include your primary Cardiologist (physician) and Advanced Practice Providers (APPs -  Physician Assistants and Nurse Practitioners) who all work together to provide you with the care you need, when you need it.  We recommend signing up for the patient portal called "MyChart".  Sign up information is provided on this After Visit Summary.  MyChart is used to connect with patients for Virtual Visits (Telemedicine).  Patients are able to view lab/test results, encounter notes, upcoming appointments, etc.  Non-urgent messages can be sent to your provider as well.   To learn more about what you can do with MyChart, go to [email or web address].    Your next appointment:   6 month(s)  The format for your next appointment:   In Person  Provider:   You may see Mertie Moores, MD or one of the following Advanced Practice Providers on your designated Care Team:   Richardson Dopp, PA-C Vin Bhagat, [place]   Other Instructions  [place] Monitor Instructions  Your physician has requested you  wear a ZIO patch monitor for 14 days.  This is a single patch monitor. [employer] supplies one patch monitor per enrollment. Additional stickers are not available. Please do not apply patch if you will be having a Nuclear Stress Test,  Echocardiogram, Cardiac CT, MRI, or Chest Xray during the period you would be wearing the  monitor. The patch cannot be worn during these tests. You cannot remove and re-apply the  ZIO XT patch monitor.  Your ZIO patch monitor will be mailed 3 day USPS to your address on file. It may take 3-5 days  to receive your monitor after you have been enrolled.  Once you have received your monitor, please review the enclosed instructions. Your monitor  has already been registered assigning a specific monitor serial # to you.  Billing and Patient Assistance Program Information  We have supplied [employer] with any of your insurance information on file for billing purposes. [employer] offers a sliding scale Patient Assistance Program for patients that do not have  insurance, or whose insurance does not completely cover the cost of the ZIO monitor.  You must apply for the Patient Assistance Program to qualify for this discounted rate.  To apply, please call [employer] at [phone], select option 4, select option 2, ask to apply for  Patient Assistance Program. Theodore Demark will ask your household income, and how many people  are in your household. They will quote your out-of-pocket cost based on that information.  [employer] will also be able to set up a 18-month, interest-free payment plan  if needed.  Applying the monitor   Shave hair from upper left chest.  Hold abrader disc by orange tab. Rub abrader in 40 strokes over the upper left chest as  indicated in your monitor instructions.  Clean area with 4 enclosed alcohol pads. Let dry.  Apply patch as indicated in monitor instructions. Patch will be placed under collarbone on left  side of chest with arrow pointing upward.  Rub patch  adhesive wings for 2 minutes. Remove white label marked "1". Remove the white  label marked "2". Rub patch adhesive wings for 2 additional minutes.  While looking in a mirror, press and release button in center of patch. A small green light will  flash 3-4 times. This will be your only indicator that the monitor has been turned on.  Do not shower for the first 24 hours. You may shower after the first 24 hours.  Press the button if you feel a symptom. You will hear a small click. Record Date, Time and  Symptom in the Patient Logbook.  When you are ready to remove the patch, follow instructions on the last 2 pages of Patient  Logbook. Stick patch monitor onto the last page of Patient Logbook.  Place Patient Logbook in the blue and white box. Use locking tab on box and tape box closed  securely. The blue and white box has prepaid postage on it. Please place it in the mailbox as  soon as possible. Your physician should have your test results approximately 7 days after the  monitor has been mailed back to [facility].  Call [place] at [phone] if you have questions regarding  your ZIO XT patch monitor. Call them immediately if you see an orange light blinking on your  monitor.  If your monitor falls off in less than 4 days, contact our Monitor department at [phone].  If your monitor becomes loose or falls off after 4 days call [employer] at [phone] for  suggestions on securing your monitor

## 2021-02-02 DIAGNOSIS — R55 Syncope and collapse: Secondary | ICD-10-CM | POA: Diagnosis not present

## 2021-02-14 ENCOUNTER — Telehealth: Payer: Self-pay

## 2021-02-14 NOTE — Telephone Encounter (Signed)
Called patient to discuss potential surgery dates, patient agreed to 01/17

## 2021-02-19 ENCOUNTER — Other Ambulatory Visit: Payer: Self-pay | Admitting: Family Medicine

## 2021-02-19 DIAGNOSIS — Z1231 Encounter for screening mammogram for malignant neoplasm of breast: Secondary | ICD-10-CM

## 2021-02-25 ENCOUNTER — Encounter: Payer: Self-pay | Admitting: Family Medicine

## 2021-02-28 ENCOUNTER — Encounter: Payer: Self-pay | Admitting: Family Medicine

## 2021-02-28 ENCOUNTER — Ambulatory Visit (INDEPENDENT_AMBULATORY_CARE_PROVIDER_SITE_OTHER): Payer: 59 | Admitting: Family Medicine

## 2021-02-28 ENCOUNTER — Other Ambulatory Visit: Payer: Self-pay

## 2021-02-28 DIAGNOSIS — N939 Abnormal uterine and vaginal bleeding, unspecified: Secondary | ICD-10-CM | POA: Diagnosis not present

## 2021-02-28 MED ORDER — DOXYCYCLINE HYCLATE 100 MG PO CAPS: 100.0000 mg | ORAL_CAPSULE | Freq: Two times a day (BID) | ORAL | 0 refills | Status: DC

## 2021-02-28 MED ORDER — NORETHINDRONE ACETATE 5 MG PO TABS: 5.0000 mg | ORAL_TABLET | Freq: Every day | ORAL | 2 refills | Status: DC

## 2021-02-28 NOTE — Progress Notes (Signed)
   Subjective:    Patient ID: Elaine [name] is a 45 y.o. female presenting with No chief complaint on file.  on 02/28/2021  HPI: Reports on-going bleeding. She is on [facility] and has an appointment for HTA in January. Taking meds as directed and notes on-going bleeding since 11/5. She is spotting daily. She now in the last week or so, has developed more heavy bleeding between 4-7 pm on most nights and is passing clots. Reports a lot of cramping and uterus feels sore. Reports some light-headedness and dizziness. Was on TXA and this was no longer working for her.  Review of Systems  Constitutional:  Negative for chills and fever.  Respiratory:  Negative for shortness of breath.   Cardiovascular:  Negative for chest pain.  Gastrointestinal:  Negative for abdominal pain, nausea and vomiting.  Genitourinary:  Positive for pelvic pain and vaginal bleeding. Negative for dysuria.  Skin:  Negative for rash.     Objective:     Physical Exam Constitutional:      General: She is not in acute distress.    Appearance: She is well-developed.  HENT:     Head: Normocephalic and atraumatic.  Eyes:     General: No scleral icterus. Cardiovascular:     Rate and Rhythm: Normal rate.  Pulmonary:     Effort: Pulmonary effort is normal.  Abdominal:     Palpations: Abdomen is soft.  Genitourinary:    Comments: BUS normal, vagina is pink and rugated, cervix is parous without lesion  Musculoskeletal:     Cervical back: Neck supple.  Skin:    General: Skin is warm and dry.  Neurological:     Mental Status: She is alert and oriented to person, place, and time.        Assessment & Plan:   Problem List Items Addressed This Visit       Unprioritized   Abnormal uterine bleeding - Primary    Add doxy for presumed endometritis. Add Aygestin. Check labs. Would like full hyst, but has a medical hx that makes this less desirable, including prior lap colectomy due to diverticular disease and malignant  hyperthermia and alpha-1-antitrypsin deficiency with only 60% lung capacity.--will discuss with other surgeons       Relevant Medications   doxycycline (VIBRAMYCIN) 100 MG capsule   norethindrone (AYGESTIN) 5 MG tablet   Other Relevant Orders   CBC   TSH    Return if symptoms worsen or fail to improve, for Call next week if still bleeding for medication increase.  [name] 02/28/2021 10:32 AM

## 2021-02-28 NOTE — Assessment & Plan Note (Signed)
Add doxy for presumed endometritis. Add Aygestin. Check labs. Would like full hyst, but has a medical hx that makes this less desirable, including prior lap colectomy due to diverticular disease and malignant hyperthermia and alpha-1-antitrypsin deficiency with only 60% lung capacity.--will discuss with other surgeons

## 2021-03-01 LAB — CBC
Hematocrit: 43 % (ref 34.0–46.6)
Hemoglobin: 14.5 g/dL (ref 11.1–15.9)
MCH: 30.4 pg (ref 26.6–33.0)
MCHC: 33.7 g/dL (ref 31.5–35.7)
MCV: 90 fL (ref 79–97)
Platelets: 368 10*3/uL (ref 150–450)
RBC: 4.77 x10E6/uL (ref 3.77–5.28)
RDW: 12.9 % (ref 11.7–15.4)
WBC: 13.5 10*3/uL — ABNORMAL HIGH (ref 3.4–10.8)

## 2021-03-01 LAB — TSH: TSH: 1.77 u[IU]/mL (ref 0.450–4.500)

## 2021-03-13 ENCOUNTER — Other Ambulatory Visit: Payer: Self-pay | Admitting: Family Medicine

## 2021-03-13 DIAGNOSIS — N939 Abnormal uterine and vaginal bleeding, unspecified: Secondary | ICD-10-CM

## 2021-03-15 ENCOUNTER — Ambulatory Visit: Payer: 59 | Admitting: Family Medicine

## 2021-04-02 ENCOUNTER — Other Ambulatory Visit: Payer: Self-pay | Admitting: Primary Care

## 2021-04-02 DIAGNOSIS — G43109 Migraine with aura, not intractable, without status migrainosus: Secondary | ICD-10-CM

## 2021-04-02 MED ORDER — ZOLMITRIPTAN 5 MG PO TABS: ORAL_TABLET | ORAL | 0 refills | Status: DC

## 2021-04-03 ENCOUNTER — Ambulatory Visit: Payer: 59

## 2021-04-07 NOTE — H&P (Signed)
Elaine [name] is an 46 y.o. G44P1020 female.   Chief Complaint: abnormal uterine bleeding HPI: Patient has had years of bleeding and has been on multiple meds. Has an 8 cm fibroid. Meds have not stopped bleeding. She does have a h/o malignant hyperthermia, Alpha-1-antitrypsin deficiency and h/o partial colectomy. She has been on Slynd and Aygestin is still having bleeding.  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 She has never used smokeless tobacco. She reports current alcohol use of about 3.0 standard drinks per week. She reports that she does not use drugs.  Allergies:  Allergies  Allergen Reactions   Anesthetics, Amide Anaphylaxis   Other Anaphylaxis    Other reaction(s): Unknown   Cephalexin     Blisters   Dilaudid  [Hydromorphone Hcl] Nausea And Vomiting   Penicillins     No medications prior to admission.    A comprehensive review of systems was negative.  There were no vitals taken for this visit. There were no vitals taken for this visit. General appearance: alert, cooperative, and appears stated age Head: Normocephalic, without obvious abnormality, atraumatic Lungs:  normal  effort Heart: regular rate and rhythm Abdomen: soft, non-tender; bowel sounds normal; no masses,  no organomegaly Extremities: extremities normal, atraumatic, no cyanosis or edema Skin: Skin color, texture, turgor normal. No rashes or lesions Neurologic: Grossly normal   Lab Results  Component Value Date   WBC 13.5 (H) 02/28/2021   HGB 14.5 02/28/2021   HCT 43.0 02/28/2021   MCV 90 02/28/2021   PLT 368 02/28/2021   No results found for: PREGTESTUR, PREGSERUM, HCG, HCGQUANT   Assessment/Plan Principal Problem:   Abnormal uterine bleeding  For Dilation and Curettage and HTA. Risks include but are not limited to bleeding, infection, injury to surrounding structures, including bowel, bladder and ureters, blood clots, and death.  Likelihood of success is high.    Elaine [name] 04/07/2021, 1:34 PM

## 2021-04-15 ENCOUNTER — Encounter (HOSPITAL_COMMUNITY): Payer: Self-pay | Admitting: Family Medicine

## 2021-04-15 ENCOUNTER — Other Ambulatory Visit: Payer: Self-pay

## 2021-04-15 NOTE — Progress Notes (Addendum)
Mrs Elaine [name] denies chest pain or shortness of breath. Patient denies having any s/s of Covid in her household.  Patient denies any known exposure to Covid.   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  Mrs Elaine [name] has Alpha 1 Antitrypsin .  Patient has a Pulmonologist - Dr. Lenice Llamas.  PCP is Dr. Alma Friendly. Cardiologist is Dr. Acie Fredrickson  Mrs. Elaine [name] has an orthopedic MD also. Patient has a pelvic fracture and is on bedrest.  I instructed Mrs. Elaine [name] to shower with antibiotic soap, if it is available.  Dry off with a clean towel. Do not put lotion, powder, cologne or deodorant or makeup.No jewelry or piercings. Men may shave their face and neck. Woman should not shave. No nail polish, artificial or acrylic nails. Wear clean clothes, brush your teeth. Glasses, contact lens,dentures or partials may not be worn in the OR. If you need to wear them, please bring a case for glasses, do not wear contacts or bring a case, the hospital does not have contact cases, dentures or partials will have to be removed , make sure they are clean, we will provide a denture cup to put them in. You will need some one to drive you home and a responsible person over the age of 72 to stay with you for the first 24 hours after surgery.   I asked anesthesiologist PA-C to review.

## 2021-04-15 NOTE — Progress Notes (Signed)
Anesthesia Chart Review: Same-day work-up  Follows with cardiology for history of tachycardia and orthostatic syncope.  Last seen by Dr. Acie Fredrickson 01/30/2021.  14-day ZIO monitor ordered at that time to evaluate recent episode of syncope.  Dr. Acie Fredrickson commented on result 03/02/2021, "Sinus rhythm. Non sustained Ventricular tachycardia - benign .  I dont think this is the cause of her episode of syncope/ This may be what she is. She has normal LV function. She has sinus bradycardia.  Will continue to follow ."  Of note, patient had coronary calcium score of 0 per CT 2020.  History of alpha-1 antitrypsin deficiency and pectus excavatum followed by pulmonologist Dr. Shearon Stalls.  Last seen 10/08/2020.  At that time reported some symptoms of chest pain or shortness of breath.  It was noted that she was walking 3 to 4 miles a day.  Per note, "Lorre Nick has extensive family history of A1AT heterozygote. Unclear if her current symptoms are related to emphysema, more likely related to pectus excavatum with worsening restriction. CMP reviewed this year and her LFTs are within normal limits, low suspicion for liver involvement. Will obtain full set of PFTs and repeat Chest xray today. Would defer inhaler therapy until we have a more clear idea of what's going on, especially since she had side effects to therapy."  Per Dr. Mauricio Po note, PFTs showed mild restriction to ventilation, normal spirometry.  Chest x-ray showed no active cardiopulmonary abnormalities.  Patient reports history of biopsy-proven malignant hyperthermia.  Patient currently being followed by orthopedics for pain secondary to stress related injury over the lateral femoral head.  Last seen by Dr. Delilah Shan at [facility] 03/22/2021.  Per note, she was to continue using a walker for 4 weeks to allow stress injury in her hip to heal.  Patient will need day of surgery labs and evaluation.   EKG 01/30/2021: NSR.  Rate 85.  CHEST - 2 VIEW 10/08/2020: COMPARISON:   02/05/2017   FINDINGS: Pectus deformity is identified with extension way shin of the right heart border. The heart size and mediastinal contours are within normal limits. Both lungs are clear.   IMPRESSION: No active cardiopulmonary abnormalities.   Event monitor 05/12/2020: Sinus rhythm including NSR, sinus bradycardia and sinus tachycardia Rare episodes of nonsustained VT- longest of 6 beats.  TTE 04/05/2019:  1. Left ventricular ejection fraction, by visual estimation, is 60 to  65%. The left ventricle has normal function. There is no left ventricular  hypertrophy.   2. The left ventricle has no regional wall motion abnormalities.   3. Global right ventricle has normal systolic function.The right  ventricular size is normal. No increase in right ventricular wall  thickness.   4. Left atrial size was normal.   5. Right atrial size was normal.   6. The mitral valve is normal in structure. Mild mitral valve  regurgitation. No evidence of mitral stenosis.   7. The tricuspid valve is normal in structure.   8. The aortic valve is normal in structure. Aortic valve regurgitation is  not visualized. No evidence of aortic valve sclerosis or stenosis.   9. The pulmonic valve was normal in structure. Pulmonic valve  regurgitation is not visualized.  10. The inferior vena cava is normal in size with greater than 50%  respiratory variability, suggesting right atrial pressure of 3 mmHg.  11. The average left ventricular global longitudinal strain is -20.3 %.   CT cardiac scoring 04/05/2018: IMPRESSION: Coronary calcium score of 0. This was 0 percentile for age  and sex matched control.   Wynonia Musty [facility] Short Stay Center/Anesthesiology Phone [phone] 04/15/2021 3:52 PM

## 2021-04-15 NOTE — Anesthesia Preprocedure Evaluation (Addendum)
Anesthesia Evaluation  Patient identified by MRN, date of birth, ID band Patient awake    Reviewed: Allergy & Precautions, NPO status , Patient's Chart, lab work & pertinent test results  History of Anesthesia Complications (+) MALIGNANT HYPERTHERMIA  Airway Mallampati: II  TM Distance: >3 FB Neck ROM: Full    Dental no notable dental hx.    Pulmonary asthma , former smoker,    Pulmonary exam normal        Cardiovascular negative cardio ROS   Rhythm:Regular Rate:Normal     Neuro/Psych  Headaches, Seizures - (none since age 46), Well Controlled,  negative psych ROS   GI/Hepatic Neg liver ROS, hiatal hernia, GERD  ,  Endo/Other  Hypothyroidism   Renal/GU negative Renal ROS   AUB, fibroids     Musculoskeletal  (+) Arthritis , Osteoarthritis,    Abdominal Normal abdominal exam  (+)   Peds  (+) ADHD Hematology negative hematology ROS (+)   Anesthesia Other Findings   Reproductive/Obstetrics                            Anesthesia Physical Anesthesia Plan  ASA: 3  Anesthesia Plan: Spinal and MAC   Post-op Pain Management:    Induction: Intravenous  PONV Risk Score and Plan: 2 and Ondansetron, Dexamethasone, Propofol infusion, Midazolam and Treatment may vary due to age or medical condition  Airway Management Planned: Simple Face Mask, Natural Airway and Nasal Cannula  Additional Equipment: None  Intra-op Plan:   Post-operative Plan:   Informed Consent: I have reviewed the patients History and Physical, chart, labs and discussed the procedure including the risks, benefits and alternatives for the proposed anesthesia with the patient or authorized representative who has indicated his/her understanding and acceptance.     Dental advisory given  Plan Discussed with: CRNA  Anesthesia Plan Comments: ([place], spinal  PAT note by Karoline Caldwell, PA-C:  Follows with  cardiology for history of tachycardia and orthostatic syncope.  Last seen by Dr. Acie Fredrickson 01/30/2021.  14-day ZIO monitor ordered at that time to evaluate recent episode of syncope.  Dr. Acie Fredrickson commented on result 03/02/2021, "Sinus rhythm. Non sustained Ventricular tachycardia - benign . I dont think this is the cause of her episode of syncope/ This may be what she is. She has normal LV function. She has sinus bradycardia. Will continue to follow ."  Of note, patient had coronary calcium score of 0 per CT 2020.  History of alpha-1 antitrypsin deficiency and pectus excavatum followed by pulmonologist Dr. Shearon Stalls.  Last seen 10/08/2020.  At that time reported some symptoms of chest pain or shortness of breath.  It was noted that she was walking 3 to 4 miles a day.  Per note, "Lorre Nick has extensive family history of A1AT heterozygote. Unclear if her current symptoms are related to emphysema, more likely related to pectus excavatum with worsening restriction. CMP reviewed this year and her LFTs are within normal limits, low suspicion for liver involvement. Will obtain full set of PFTs and repeat Chest xray today. Would defer inhaler therapy until we have a more clear idea of what's going on, especially since she had side effects to therapy."  Per Dr. Mauricio Po note, PFTs showed mild restriction to ventilation, normal spirometry.  Chest x-ray showed no active cardiopulmonary abnormalities.  Patient reports history of biopsy-proven malignant hyperthermia.  Patient currently being followed by orthopedics for pain secondary to stress related injury over the lateral femoral  head.  Last seen by Dr. Delilah Shan at [facility] 03/22/2021.  Per note, she was to continue using a walker for 4 weeks to allow stress injury in her hip to heal.  Patient will need day of surgery labs and evaluation.   EKG 01/30/2021: NSR.  Rate 85.  CHEST - 2 VIEW 10/08/2020: COMPARISON: 02/05/2017  FINDINGS: Pectus deformity is identified with  extension way shin of the right heart border. The heart size and mediastinal contours are within normal limits. Both lungs are clear.  IMPRESSION: No active cardiopulmonary abnormalities.  Event monitor 05/12/2020:  Sinus rhythm including NSR, sinus bradycardia and sinus tachycardia  Rare episodes of nonsustained VT- longest of 6 beats.  TTE 04/05/2019: 1. Left ventricular ejection fraction, by visual estimation, is 60 to  65%. The left ventricle has normal function. There is no left ventricular  hypertrophy.  2. The left ventricle has no regional wall motion abnormalities.  3. Global right ventricle has normal systolic function.The right  ventricular size is normal. No increase in right ventricular wall  thickness.  4. Left atrial size was normal.  5. Right atrial size was normal.  6. The mitral valve is normal in structure. Mild mitral valve  regurgitation. No evidence of mitral stenosis.  7. The tricuspid valve is normal in structure.  8. The aortic valve is normal in structure. Aortic valve regurgitation is  not visualized. No evidence of aortic valve sclerosis or stenosis.  9. The pulmonic valve was normal in structure. Pulmonic valve  regurgitation is not visualized.  10. The inferior vena cava is normal in size with greater than 50%  respiratory variability, suggesting right atrial pressure of 3 mmHg.  11. The average left ventricular global longitudinal strain is -20.3 %.   CT cardiac scoring 04/05/2018: IMPRESSION: Coronary calcium score of 0. This was 0 percentile for age and sex matched control. )     Anesthesia Quick Evaluation

## 2021-04-16 ENCOUNTER — Encounter (HOSPITAL_COMMUNITY): Payer: Self-pay | Admitting: Family Medicine

## 2021-04-16 ENCOUNTER — Encounter (HOSPITAL_COMMUNITY)
Admission: RE | Disposition: A | Discharge status: Home / Self Care | Payer: Self-pay | Source: Home / Self Care | Attending: Family Medicine

## 2021-04-16 ENCOUNTER — Ambulatory Visit (HOSPITAL_COMMUNITY): Payer: 59 | Admitting: Physician Assistant

## 2021-04-16 ENCOUNTER — Ambulatory Visit (HOSPITAL_COMMUNITY)
Admission: RE | Admit: 2021-04-16 | Discharge: 2021-04-16 | Disposition: A | Discharge status: Home / Self Care | Payer: 59 | Attending: Family Medicine | Admitting: Family Medicine

## 2021-04-16 DIAGNOSIS — E785 Hyperlipidemia, unspecified: Secondary | ICD-10-CM | POA: Diagnosis not present

## 2021-04-16 DIAGNOSIS — J45909 Unspecified asthma, uncomplicated: Secondary | ICD-10-CM | POA: Diagnosis not present

## 2021-04-16 DIAGNOSIS — N920 Excessive and frequent menstruation with regular cycle: Secondary | ICD-10-CM

## 2021-04-16 DIAGNOSIS — Z87891 Personal history of nicotine dependence: Secondary | ICD-10-CM | POA: Diagnosis not present

## 2021-04-16 DIAGNOSIS — K219 Gastro-esophageal reflux disease without esophagitis: Secondary | ICD-10-CM | POA: Insufficient documentation

## 2021-04-16 DIAGNOSIS — M1990 Unspecified osteoarthritis, unspecified site: Secondary | ICD-10-CM | POA: Diagnosis not present

## 2021-04-16 DIAGNOSIS — E039 Hypothyroidism, unspecified: Secondary | ICD-10-CM | POA: Diagnosis not present

## 2021-04-16 DIAGNOSIS — N939 Abnormal uterine and vaginal bleeding, unspecified: Secondary | ICD-10-CM | POA: Diagnosis present

## 2021-04-16 DIAGNOSIS — D259 Leiomyoma of uterus, unspecified: Secondary | ICD-10-CM | POA: Insufficient documentation

## 2021-04-16 HISTORY — DX: Personal history of other diseases of the digestive system: Z87.19

## 2021-04-16 HISTORY — DX: Family history of other specified conditions: Z84.89

## 2021-04-16 HISTORY — PX: DILITATION & CURRETTAGE/HYSTROSCOPY WITH HYDROTHERMAL ABLATION: SHX5570

## 2021-04-16 HISTORY — DX: Orthostatic hypotension: I95.1

## 2021-04-16 HISTORY — DX: Cardiac murmur, unspecified: R01.1

## 2021-04-16 HISTORY — DX: Unspecified asthma, uncomplicated: J45.909

## 2021-04-16 HISTORY — DX: Headache, unspecified: R51.9

## 2021-04-16 HISTORY — DX: Attention-deficit hyperactivity disorder, unspecified type: F90.9

## 2021-04-16 LAB — BASIC METABOLIC PANEL
Anion gap: 9 (ref 5–15)
BUN: 10 mg/dL (ref 6–20)
CO2: 19 mmol/L — ABNORMAL LOW (ref 22–32)
Calcium: 9.5 mg/dL (ref 8.9–10.3)
Chloride: 108 mmol/L (ref 98–111)
Creatinine, Ser: 0.83 mg/dL (ref 0.44–1.00)
GFR, Estimated: >60 mL/min (ref >60)
Glucose, Bld: 97 mg/dL (ref 70–99)
Potassium: 4.2 mmol/L (ref 3.5–5.1)
Sodium: 136 mmol/L (ref 135–145)

## 2021-04-16 LAB — POCT PREGNANCY, URINE: Preg Test, Ur: NEGATIVE

## 2021-04-16 LAB — CBC
HCT: 42.3 % (ref 36.0–46.0)
Hemoglobin: 14.6 g/dL (ref 12.0–15.0)
MCH: 30.9 pg (ref 26.0–34.0)
MCHC: 34.5 g/dL (ref 30.0–36.0)
MCV: 89.4 fL (ref 80.0–100.0)
Platelets: 425 10*3/uL — ABNORMAL HIGH (ref 150–400)
RBC: 4.73 MIL/uL (ref 3.87–5.11)
RDW: 12.6 % (ref 11.5–15.5)
WBC: 11.5 10*3/uL — ABNORMAL HIGH (ref 4.0–10.5)
nRBC: 0 % (ref 0.0–0.2)

## 2021-04-16 SURGERY — DILATATION & CURETTAGE/HYSTEROSCOPY WITH HYDROTHERMAL ABLATION
Anesthesia: General | Site: Uterus

## 2021-04-16 MED ORDER — OXYCODONE-ACETAMINOPHEN 5-325 MG PO TABS: 1.0000 | ORAL_TABLET | Freq: Four times a day (QID) | ORAL | 0 refills | Status: DC | PRN

## 2021-04-16 MED ORDER — AMISULPRIDE (ANTIEMETIC) 5 MG/2ML IV SOLN
10.0000 mg | Freq: Once | INTRAVENOUS | Status: AC
  Administered 2021-04-16: 10 mg via INTRAVENOUS

## 2021-04-16 MED ORDER — AMISULPRIDE (ANTIEMETIC) 5 MG/2ML IV SOLN
INTRAVENOUS | Status: AC
  Filled 2021-04-16: qty 4

## 2021-04-16 MED ORDER — PHENYLEPHRINE 40 MCG/ML (10ML) SYRINGE FOR IV PUSH (FOR BLOOD PRESSURE SUPPORT)
PREFILLED_SYRINGE | INTRAVENOUS | Status: DC | PRN
  Administered 2021-04-16 (×3): 40 ug via INTRAVENOUS

## 2021-04-16 MED ORDER — NORETHINDRONE ACETATE 5 MG PO TABS: 10.0000 mg | ORAL_TABLET | Freq: Every day | ORAL | 1 refills | Status: DC

## 2021-04-16 MED ORDER — LACTATED RINGERS IV SOLN: INTRAVENOUS | Status: DC

## 2021-04-16 MED ORDER — BUPIVACAINE IN DEXTROSE 0.75-8.25 % IT SOLN
INTRATHECAL | Status: DC | PRN
  Administered 2021-04-16: 1.8 mL via INTRATHECAL

## 2021-04-16 MED ORDER — MIDAZOLAM HCL 2 MG/2ML IJ SOLN
INTRAMUSCULAR | Status: AC
  Filled 2021-04-16: qty 2

## 2021-04-16 MED ORDER — ONDANSETRON HCL 4 MG/2ML IJ SOLN
INTRAMUSCULAR | Status: AC
  Filled 2021-04-16: qty 2

## 2021-04-16 MED ORDER — PROPOFOL 500 MG/50ML IV EMUL
INTRAVENOUS | Status: DC | PRN
Start: 2021-04-16 — End: 2021-04-16
  Administered 2021-04-16: 100 ug/kg/min via INTRAVENOUS

## 2021-04-16 MED ORDER — FENTANYL CITRATE (PF) 100 MCG/2ML IJ SOLN
INTRAMUSCULAR | Status: DC | PRN
Start: 2021-04-16 — End: 2021-04-16
  Administered 2021-04-16: 50 ug via INTRAVENOUS

## 2021-04-16 MED ORDER — POVIDONE-IODINE 10 % EX SWAB
2.0000 "application " | Freq: Once | CUTANEOUS | Status: AC
  Administered 2021-04-16: 2 via TOPICAL

## 2021-04-16 MED ORDER — PROMETHAZINE HCL 25 MG/ML IJ SOLN: 6.2500 mg | INTRAMUSCULAR | Status: DC | PRN

## 2021-04-16 MED ORDER — ACETAMINOPHEN 500 MG PO TABS
1000.0000 mg | ORAL_TABLET | ORAL | Status: AC
  Administered 2021-04-16: 1000 mg via ORAL
  Filled 2021-04-16: qty 2

## 2021-04-16 MED ORDER — MIDAZOLAM HCL 5 MG/5ML IJ SOLN
INTRAMUSCULAR | Status: DC | PRN
Start: 2021-04-16 — End: 2021-04-16
  Administered 2021-04-16: 2 mg via INTRAVENOUS

## 2021-04-16 MED ORDER — FENTANYL CITRATE (PF) 100 MCG/2ML IJ SOLN
INTRAMUSCULAR | Status: AC
  Filled 2021-04-16: qty 2

## 2021-04-16 MED ORDER — DEXAMETHASONE SODIUM PHOSPHATE 10 MG/ML IJ SOLN
INTRAMUSCULAR | Status: AC
  Filled 2021-04-16: qty 1

## 2021-04-16 MED ORDER — ALBUMIN HUMAN 5 % IV SOLN
INTRAVENOUS | Status: AC
  Filled 2021-04-16: qty 250

## 2021-04-16 MED ORDER — FENTANYL CITRATE (PF) 100 MCG/2ML IJ SOLN
50.0000 ug | Freq: Once | INTRAMUSCULAR | Status: AC
  Administered 2021-04-16: 50 ug via INTRAVENOUS

## 2021-04-16 MED ORDER — KETOROLAC TROMETHAMINE 30 MG/ML IJ SOLN
30.0000 mg | Freq: Four times a day (QID) | INTRAMUSCULAR | Status: DC | PRN
  Administered 2021-04-16: 30 mg via INTRAVENOUS

## 2021-04-16 MED ORDER — LIDOCAINE 2% (20 MG/ML) 5 ML SYRINGE
INTRAMUSCULAR | Status: AC
  Filled 2021-04-16: qty 5

## 2021-04-16 MED ORDER — FENTANYL CITRATE (PF) 100 MCG/2ML IJ SOLN
25.0000 ug | INTRAMUSCULAR | Status: DC | PRN
  Administered 2021-04-16 (×2): 50 ug via INTRAVENOUS
  Administered 2021-04-16 (×2): 25 ug via INTRAVENOUS

## 2021-04-16 MED ORDER — FENTANYL CITRATE (PF) 250 MCG/5ML IJ SOLN
INTRAMUSCULAR | Status: AC
  Filled 2021-04-16: qty 5

## 2021-04-16 MED ORDER — LIDOCAINE HCL 1 % IJ SOLN
INTRAMUSCULAR | Status: DC | PRN
  Administered 2021-04-16: 20 mL

## 2021-04-16 MED ORDER — PROPOFOL 10 MG/ML IV BOLUS
INTRAVENOUS | Status: AC
  Filled 2021-04-16: qty 20

## 2021-04-16 MED ORDER — CHLORHEXIDINE GLUCONATE 0.12 % MT SOLN
15.0000 mL | Freq: Once | OROMUCOSAL | Status: AC
  Administered 2021-04-16: 15 mL via OROMUCOSAL
  Filled 2021-04-16: qty 15

## 2021-04-16 MED ORDER — ORAL CARE MOUTH RINSE: 15.0000 mL | Freq: Once | OROMUCOSAL | Status: AC

## 2021-04-16 MED ORDER — KETOROLAC TROMETHAMINE 30 MG/ML IJ SOLN
INTRAMUSCULAR | Status: AC
  Filled 2021-04-16: qty 1

## 2021-04-16 MED ORDER — ALBUMIN HUMAN 5 % IV SOLN
12.5000 g | Freq: Once | INTRAVENOUS | Status: AC
  Administered 2021-04-16: 12.5 g via INTRAVENOUS

## 2021-04-16 MED ORDER — SODIUM CHLORIDE 0.9 % IR SOLN
Status: DC | PRN
Start: 2021-04-16 — End: 2021-04-16
  Administered 2021-04-16: 3000 mL

## 2021-04-16 MED ORDER — LIDOCAINE HCL 1 % IJ SOLN
INTRAMUSCULAR | Status: AC
  Filled 2021-04-16: qty 20

## 2021-04-16 MED ORDER — ACETAMINOPHEN 10 MG/ML IV SOLN: 1000.0000 mg | Freq: Once | INTRAVENOUS | Status: DC | PRN

## 2021-04-16 SURGICAL SUPPLY — 16 items
CANISTER SUCT 3000ML PPV (MISCELLANEOUS) ×2 IMPLANT
CATH ROBINSON RED A/P 16FR (CATHETERS) ×2 IMPLANT
DRSG TELFA 3X8 NADH (GAUZE/BANDAGES/DRESSINGS) ×2 IMPLANT
GAUZE 4X4 16PLY ~~LOC~~+RFID DBL (SPONGE) ×1 IMPLANT
GLOVE SURG LTX SZ7 (GLOVE) ×2 IMPLANT
GLOVE SURG UNDER POLY LF SZ7 (GLOVE) ×4 IMPLANT
GOWN STRL REUS W/ TWL LRG LVL3 (GOWN DISPOSABLE) ×2 IMPLANT
GOWN STRL REUS W/TWL LRG LVL3 (GOWN DISPOSABLE) ×2
PACK VAGINAL MINOR WOMEN LF (CUSTOM PROCEDURE TRAY) ×2 IMPLANT
PAD DRESSING TELFA 3X8 NADH (GAUZE/BANDAGES/DRESSINGS) IMPLANT
PAD OB MATERNITY 4.3X12.25 (PERSONAL CARE ITEMS) ×2 IMPLANT
SET GENESYS HTA PROCERVA (MISCELLANEOUS) ×2 IMPLANT
TOWEL GREEN STERILE FF (TOWEL DISPOSABLE) ×4 IMPLANT
TRAY FOL W/BAG SLVR 16FR STRL (SET/KITS/TRAYS/PACK) IMPLANT
TRAY FOLEY W/BAG SLVR 16FR LF (SET/KITS/TRAYS/PACK) ×1
UNDERPAD 30X36 HEAVY ABSORB (UNDERPADS AND DIAPERS) ×2 IMPLANT

## 2021-04-16 NOTE — Interval H&P Note (Signed)
History and Physical Interval Note:  04/16/2021 10:03 AM  Elaine [name]  has presented today for surgery, with the diagnosis of ABNORMAL UTERINE BLEEDING.  The various methods of treatment have been discussed with the patient and family. After consideration of risks, benefits and other options for treatment, the patient has consented to  Procedure(s) with comments: DILATATION & CURETTAGE/HYSTEROSCOPY WITH HYDROTHERMAL ABLATION (N/A) - rep will be here confirmed on 04/08/21 CS as a surgical intervention.  The patient's history has been reviewed, patient examined, no change in status, stable for surgery.  I have reviewed the patient's chart and labs.  Questions were answered to the patient's satisfaction.     [name]

## 2021-04-16 NOTE — Op Note (Signed)
Preoperative diagnosis: Menorrhagia  Postoperative diagnosis: Menorrhagia  Procedure: HTA endometrial ablation, hysteroscopy, D & C  Surgeon: Standley Dakins. Kennon Rounds, M.D.  Anesthesia: GETT  Findings: Normal appearing cervix and uterine cavity with difficulty with visualization due to blood clot presence.  Estimated blood loss: Minimal  Specimens: Endometrial curettings  Disposition of specimen: Pathology  Reason for procedure: Patient is a 46 y.o. Y7X4128 with long h/o menorrhagia and failed multiple other treatments who desired a more definitive treatment.   Procedure: Patient was taken to the OR where she was placed in dorsal lithotomy in [place]. SCDs were in place. An adequate timeout was obtained. The patient was prepped and draped in the usual sterile fashion. A foley catheter is used to drain her bladder. A speculum was placed inside the vagina and the cervix visualized. The cervix was grasped anteriorly with a single-tooth tenaculum. 20 cc of quarter percent Marcaine were injected for paracervical block. The uterus sounded to 10 cm. Sequential dilation was done to a #21 dilator, and the HTA with hysteroscope was introduced into the uterine cavity. The cervix and endometrial lining appeared normal with a lot of old dark blood noted. We could get great visualization and a seal test was done and was passed. The uterine cavity was heated to between 80 and 90C and HTA was performed for 10 minutes. Cooling was then performed and the instrument removed. Sharp curettage was then performed and sample sent to pathology. All instrument, needle, and lap counts were correct x2. The patient was awakened taken to recovery room in stable condition.  [name] MD 04/16/2021 11:26 AM

## 2021-04-16 NOTE — Anesthesia Procedure Notes (Signed)
Spinal  Patient location during procedure: OR Start time: 04/16/2021 10:31 AM End time: 04/16/2021 10:33 AM Staffing Performed: anesthesiologist  Anesthesiologist: Darral Dash, DO Preanesthetic Checklist Completed: patient identified, IV checked, site marked, risks and benefits discussed, surgical consent, monitors and equipment checked, pre-op evaluation and timeout performed Spinal Block Patient position: sitting Prep: DuraPrep Patient monitoring: heart rate, cardiac monitor, continuous pulse ox and blood pressure Approach: midline Location: L4-5 Injection technique: single-shot Needle Needle type: Pencan  Needle gauge: 24 G Needle length: 10 cm Assessment Events: CSF return Additional Notes Patient identified. Risks/Benefits/Options discussed with patient including but not limited to bleeding, infection, nerve damage, paralysis, failed block, incomplete pain control, headache, blood pressure changes, nausea, vomiting, reactions to medications, itching and postpartum back pain. Confirmed with bedside nurse the patient's most recent platelet count. Confirmed with patient that they are not currently taking any anticoagulation, have any bleeding history or any family history of bleeding disorders. Patient expressed understanding and wished to proceed. All questions were answered. Sterile technique was used throughout the entire procedure. Please see nursing notes for vital signs. Warning signs of high block given to the patient including shortness of breath, tingling/numbness in hands, complete motor block, or any concerning symptoms with instructions to call for help. Patient was given instructions on fall risk and not to get out of bed. All questions and concerns addressed with instructions to call with any issues or inadequate analgesia.

## 2021-04-16 NOTE — Transfer of Care (Signed)
Immediate Anesthesia Transfer of Care Note  Patient: Elaine [name]  Procedure(s) Performed: DILATATION & CURETTAGE/HYSTEROSCOPY WITH HYDROTHERMAL ABLATION (Uterus)  Patient Location: PACU  Anesthesia Type:Spinal  Level of Consciousness: drowsy and patient cooperative  Airway & Oxygen Therapy: Patient Spontanous Breathing and Patient connected to face mask oxygen  Post-op Assessment: Report given to RN and Post -op Vital signs reviewed and stable  Post vital signs: Reviewed and stable  Last Vitals:  Vitals Value Taken Time  BP 98/54 04/16/21 1136  Temp    Pulse 84 04/16/21 1139  Resp 14 04/16/21 1139  SpO2 98 % 04/16/21 1139  Vitals shown include unvalidated device data.  Last Pain:  Vitals:   04/16/21 0855  TempSrc:   PainSc: 3       Patients Stated Pain Goal: 2 (28/97/91 5041)  Complications: No notable events documented.

## 2021-04-17 ENCOUNTER — Encounter: Payer: Self-pay | Admitting: Family Medicine

## 2021-04-17 ENCOUNTER — Encounter (HOSPITAL_COMMUNITY): Payer: Self-pay | Admitting: Family Medicine

## 2021-04-17 LAB — SURGICAL PATHOLOGY

## 2021-04-17 NOTE — Anesthesia Postprocedure Evaluation (Signed)
Anesthesia Post Note  Patient: Elaine [name]  Procedure(s) Performed: DILATATION & CURETTAGE/HYSTEROSCOPY WITH HYDROTHERMAL ABLATION (Uterus)     Patient location during evaluation: PACU Anesthesia Type: MAC and Spinal Level of consciousness: awake and alert Pain management: pain level controlled Vital Signs Assessment: post-procedure vital signs reviewed and stable Respiratory status: spontaneous breathing, nonlabored ventilation, respiratory function stable and patient connected to nasal cannula oxygen Cardiovascular status: stable and blood pressure returned to baseline Postop Assessment: no apparent nausea or vomiting Anesthetic complications: no   No notable events documented.  Last Vitals:  Vitals:   04/16/21 1450 04/16/21 1505  BP: 115/65 120/61  Pulse: 86 80  Resp: 12 14  Temp:  36.6 C  SpO2: 100% 99%    Last Pain:  Vitals:   04/16/21 1505  TempSrc:   PainSc: 6                  Rondi Ivy P Avi Kerschner

## 2021-05-02 ENCOUNTER — Encounter: Payer: Self-pay | Admitting: Family Medicine

## 2021-05-02 ENCOUNTER — Telehealth (INDEPENDENT_AMBULATORY_CARE_PROVIDER_SITE_OTHER): Payer: 59 | Admitting: Family Medicine

## 2021-05-02 DIAGNOSIS — Z09 Encounter for follow-up examination after completed treatment for conditions other than malignant neoplasm: Secondary | ICD-10-CM

## 2021-05-02 DIAGNOSIS — N939 Abnormal uterine and vaginal bleeding, unspecified: Secondary | ICD-10-CM

## 2021-05-02 NOTE — Assessment & Plan Note (Signed)
Continue Aygestin until 2 more weeks at 2 daily, then decrease to 1 daily and then stop.

## 2021-05-02 NOTE — Progress Notes (Signed)
GYNECOLOGY VIRTUAL VISIT ENCOUNTER NOTE  Provider location: [facility]   Patient location: Home  I connected with Elaine [name] on 05/02/21 at  2:15 PM EST by MyChart Video Encounter and verified that I am speaking with the correct person using two identifiers.   I discussed the limitations, risks, security and privacy concerns of performing an evaluation and management service virtually and the availability of in person appointments. I also discussed with the patient that there may be a patient responsible charge related to this service. The patient expressed understanding and agreed to proceed.   History:  Elaine [name] is a 46 y.o. G2P1020 female being evaluated today for post op check. Had cramping and swelling/bloating. 2 days of bleeding only. NOne since. Still on Aygestin. She denies any abnormal vaginal discharge, bleeding, pelvic pain or other concerns.       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[place])    No compelte work up, but suspected   Headache    Heart murmur    " Muscial " murmer -   History of hiatal hernia    Hyperlipidemia    Malignant hyperthermia    Orthostatic hypotension    Psoriasis    Ruptured ovarian cyst 2004,2016   Seizures ([place])    none since age 48   Past Surgical History:  Procedure Laterality Date   DENTAL SURGERY     DILITATION & CURRETTAGE/HYSTROSCOPY WITH HYDROTHERMAL ABLATION N/A 04/16/2021   Procedure: DILATATION & CURETTAGE/HYSTEROSCOPY WITH HYDROTHERMAL ABLATION;  Surgeon: [name], MD;  Location: [place];  Service: Gynecology;   Laterality: N/A;  rep will be here confirmed on 04/08/21 CS   LAPAROSCOPIC SIGMOID COLECTOMY  2013   The following portions of the patient's history were reviewed and updated as appropriate: allergies, current medications, past family history, past medical history, past social history, past surgical history and problem list.   Health Maintenance:  Normal pap and negative HRHPV on 09/2020.  Mammogram scheduled on 05/06/2021.   Review of Systems:  Pertinent items noted in HPI and remainder of comprehensive ROS otherwise negative.  Physical Exam:   General:  Alert, oriented and cooperative. Patient appears to be in no acute distress.  Mental Status: Normal mood and affect. Normal behavior. Normal judgment and thought content.   Respiratory: Normal respiratory effort, no problems with respiration noted  Rest of physical exam deferred due to type of encounter  Labs and Imaging Reviewed pathology   Assessment and Plan:     Problem List Items Addressed This Visit       Unprioritized   Abnormal uterine bleeding    Continue Aygestin until 2 more weeks at 2 daily, then decrease to 1 daily and then stop.      Other Visit Diagnoses     Postop check    -  Primary   healing well - ok to resume normal activities            I discussed the assessment and treatment plan with the patient.  The patient was provided an opportunity to ask questions and all were answered. The patient agreed with the plan and demonstrated an understanding of the instructions.   The patient was advised to call back or seek an in-person evaluation/go to the ED if the symptoms worsen or if the condition fails to improve as anticipated.  I provided 8 minutes of face-to-face time during this encounter.  Return in about 5 months (around 09/29/2021) for repeat pap.  [name], MD [facility], [place]

## 2021-05-02 NOTE — Addendum Note (Signed)
Addended by: [name] on: 05/02/2021 03:06 PM   Modules accepted: Level of Service

## 2021-05-06 ENCOUNTER — Other Ambulatory Visit: Payer: Self-pay

## 2021-05-06 ENCOUNTER — Ambulatory Visit: Payer: 59

## 2021-05-06 DIAGNOSIS — Z148 Genetic carrier of other disease: Secondary | ICD-10-CM

## 2021-05-07 ENCOUNTER — Other Ambulatory Visit: Payer: Self-pay

## 2021-05-07 ENCOUNTER — Ambulatory Visit: Payer: 59 | Admitting: Internal Medicine

## 2021-05-07 ENCOUNTER — Encounter: Payer: Self-pay | Admitting: Internal Medicine

## 2021-05-07 ENCOUNTER — Ambulatory Visit (INDEPENDENT_AMBULATORY_CARE_PROVIDER_SITE_OTHER): Payer: 59

## 2021-05-07 VITALS — BP 118/78 | HR 83 | Temp 98.1°F | Ht 71.0 in | Wt 208.0 lb

## 2021-05-07 DIAGNOSIS — Z148 Genetic carrier of other disease: Secondary | ICD-10-CM | POA: Diagnosis not present

## 2021-05-07 DIAGNOSIS — R0602 Shortness of breath: Secondary | ICD-10-CM | POA: Diagnosis not present

## 2021-05-07 DIAGNOSIS — J4520 Mild intermittent asthma, uncomplicated: Secondary | ICD-10-CM | POA: Diagnosis not present

## 2021-05-07 DIAGNOSIS — E8801 Alpha-1-antitrypsin deficiency: Secondary | ICD-10-CM

## 2021-05-07 DIAGNOSIS — Q676 Pectus excavatum: Secondary | ICD-10-CM

## 2021-05-07 NOTE — Patient Instructions (Signed)
Please schedule follow up scheduled with myself in 5 months.  If my schedule is not open yet, we will contact you with a reminder closer to that time. Please call [phone] if you haven't heard from [place] a month before.   Before your next visit I would like you to have: Spirometry - 30 minutes  Rest up! Let me know if breathing worsens sooner and you want to try an inhaler again before we meet.

## 2021-05-07 NOTE — Progress Notes (Signed)
Elaine [name]    [id number]    1975/06/02  Primary Care Physician:Clark, Leticia Penna, NP Date of Appointment: 05/07/2021 Established Patient Visit  Chief complaint:   Chief Complaint  Patient presents with   Follow-up    She reports she is doing well overall.      HPI: Elaine [name] is a 46 y.o. woman wwith A1AT heterozygote and mild pectus excavatum.   Interval Updates: Here for follow up. In July 2022 had a couple of falls and had bradycardia on apple watch at that time. Ended up having episodes of bradycardia and tachycardia after wearing Zio monitor. From the fall she also has a stress fracture in her right hip. She has also had dysfunctional uterine bleeding and had a D&C and ablation for uterine fibroids.   Her breathing is doing well. Had a bad URI in the fall, but resolved slowly with time. She does have dyspnea with exertion but hasn't been exerting herself lately due to other medical issues. Usually worse with heat and humidity in the summer    I have reviewed the patient's family social and past medical history and updated as appropriate.   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[place])    No compelte work up, but suspected   Headache    Heart murmur    " Muscial " murmer -   History of hiatal hernia    Hyperlipidemia    Malignant hyperthermia    Orthostatic hypotension    Psoriasis    Ruptured ovarian cyst 2004,2016   Seizures ([place])    none since age 53    Past Surgical History:  Procedure Laterality Date   DENTAL SURGERY     DILITATION & CURRETTAGE/HYSTROSCOPY WITH HYDROTHERMAL  ABLATION N/A 04/16/2021   Procedure: DILATATION & CURETTAGE/HYSTEROSCOPY WITH HYDROTHERMAL ABLATION;  Surgeon: [name], MD;  Location: [facility];  Service: Gynecology;  Laterality: N/A;  rep will be here confirmed on 04/08/21 CS   LAPAROSCOPIC SIGMOID COLECTOMY  2013    Family History  Problem Relation Age of Onset   Stroke Father 27   Heart attack Father 32   Hyperlipidemia Father    Prostate cancer Father    Stomach cancer Neg Hx    Colon polyps Neg Hx    Esophageal cancer Neg Hx    Pancreatic cancer Neg Hx     Social History   Occupational History   Not on file  Tobacco Use   Smoking status: Former    Packs/day: 0.00    Years: 5.00    Pack years: 0.00    Types: Cigarettes   Smokeless tobacco: Never  Vaping Use   Vaping Use: Never used  Substance and Sexual Activity   Alcohol use: Not Currently    Comment: ocassional- 1-2 drinks   Drug use: Never   Sexual activity: Yes     Physical Exam: Blood pressure 118/78, pulse 83, temperature 98.1 F (36.7 C), temperature source Oral, height _0  (1.803 m), weight 208 lb (94.3 kg), last  menstrual period 04/12/2021, SpO2 98 %.  Gen:      No acute distress ENT:  no nasal polyps, mucus membranes moist Lungs:    No increased respiratory effort, symmetric chest wall excursion, clear to auscultation bilaterally, no wheezes or crackles, pectus excavatum noted CV:         Regular rate and rhythm; no murmurs, rubs, or gallops.  No pedal edema   Data Reviewed: Imaging: I have personally reviewed the chest xray today shows mild pectus excavatum. No acute process  PFTs:  PFT Results Latest Ref Rng & Units 10/09/2020 02/06/2017  FVC-Pre L 3.20 3.57  FVC-Predicted Pre % 71 79  FVC-Post L 3.43 3.60  FVC-Predicted Post % 76 80  Pre FEV1/FVC % % 78 77  Post FEV1/FCV % % 81 81  FEV1-Pre L 2.51 2.75  FEV1-Predicted Pre % 70 75  FEV1-Post L 2.76 2.91  DLCO uncorrected ml/min/mmHg 24.38 23.87  DLCO UNC% % 92 73  DLCO corrected  ml/min/mmHg 24.38 22.27  DLCO COR %Predicted % 92 68  DLVA Predicted % 122 80  TLC L 5.26 5.32  TLC % Predicted % 87 88  RV % Predicted % 89 76   I have personally reviewed the patient's PFTs and normal pulmonary function, but there is a drop in FVC from 3.57 to 3.2 over the course of 4 years.   Labs: Lab Results  Component Value Date   WBC 11.5 (H) 04/16/2021   HGB 14.6 04/16/2021   HCT 42.3 04/16/2021   MCV 89.4 04/16/2021   PLT 425 (H) 04/16/2021   Lab Results  Component Value Date   NA 136 04/16/2021   K 4.2 04/16/2021   CL 108 04/16/2021   CO2 19 (L) 04/16/2021    Immunization status: Immunization History  Administered Date(s) Administered   Hepatitis B 05/30/1990   Influenza, Seasonal, Injecte, Preservative Fre 11/28/2016   Influenza,inj,Quad PF,6+ Mos 02/14/2015, 11/28/2016, 12/30/2017, 11/30/2018, 12/26/2020   Influenza,inj,quad, With Preservative 02/14/2015   Influenza-Unspecified 01/30/2015   MMR 05/30/1990   PFIZER(Purple Top)SARS-COV-2 Vaccination 06/16/2019, 07/07/2019, 12/23/2019, 06/29/2020, 12/02/2020   Pneumococcal-Unspecified 03/31/2013   Tdap 09/28/2013    External Records Personally Reviewed: OB, ED, Cardiology  Assessment:  Shortness of breath Pectus Excavatum A1AT heterozygote deficiency  Plan/Recommendations: Possible mild intermittent asthma, she is open to trial of prn ICS in the summer pending her symptoms.  Will repeat spirometry prior to next visit to monitor FVC.    Return to Care: Return in about 4 months (around 09/04/2021).   Lenice Llamas, MD [facility]

## 2021-05-08 ENCOUNTER — Other Ambulatory Visit: Payer: Self-pay | Admitting: Family Medicine

## 2021-05-08 DIAGNOSIS — N939 Abnormal uterine and vaginal bleeding, unspecified: Secondary | ICD-10-CM

## 2021-05-13 ENCOUNTER — Other Ambulatory Visit: Payer: Self-pay | Admitting: Family Medicine

## 2021-05-13 DIAGNOSIS — N939 Abnormal uterine and vaginal bleeding, unspecified: Secondary | ICD-10-CM

## 2021-05-19 ENCOUNTER — Other Ambulatory Visit: Payer: Self-pay

## 2021-05-19 ENCOUNTER — Inpatient Hospital Stay (HOSPITAL_COMMUNITY)
Admission: EM | Admit: 2021-05-19 | Discharge: 2021-05-24 | DRG: 761 | Disposition: A | Discharge status: Home / Self Care | Payer: 59 | Attending: Obstetrics and Gynecology | Admitting: Obstetrics and Gynecology

## 2021-05-19 ENCOUNTER — Emergency Department (HOSPITAL_COMMUNITY): Payer: 59

## 2021-05-19 ENCOUNTER — Encounter (HOSPITAL_COMMUNITY): Payer: Self-pay | Admitting: Emergency Medicine

## 2021-05-19 DIAGNOSIS — Z880 Allergy status to penicillin: Secondary | ICD-10-CM

## 2021-05-19 DIAGNOSIS — Z881 Allergy status to other antibiotic agents status: Secondary | ICD-10-CM | POA: Diagnosis not present

## 2021-05-19 DIAGNOSIS — N9985 Post endometrial ablation syndrome: Secondary | ICD-10-CM | POA: Diagnosis present

## 2021-05-19 DIAGNOSIS — Z885 Allergy status to narcotic agent status: Secondary | ICD-10-CM

## 2021-05-19 DIAGNOSIS — J45909 Unspecified asthma, uncomplicated: Secondary | ICD-10-CM | POA: Diagnosis present

## 2021-05-19 DIAGNOSIS — Z79899 Other long term (current) drug therapy: Secondary | ICD-10-CM

## 2021-05-19 DIAGNOSIS — D250 Submucous leiomyoma of uterus: Secondary | ICD-10-CM | POA: Diagnosis present

## 2021-05-19 DIAGNOSIS — Z87891 Personal history of nicotine dependence: Secondary | ICD-10-CM | POA: Diagnosis not present

## 2021-05-19 DIAGNOSIS — E8801 Alpha-1-antitrypsin deficiency: Secondary | ICD-10-CM | POA: Diagnosis present

## 2021-05-19 DIAGNOSIS — K219 Gastro-esophageal reflux disease without esophagitis: Secondary | ICD-10-CM | POA: Diagnosis present

## 2021-05-19 DIAGNOSIS — Z20822 Contact with and (suspected) exposure to covid-19: Secondary | ICD-10-CM | POA: Diagnosis present

## 2021-05-19 DIAGNOSIS — Y838 Other surgical procedures as the cause of abnormal reaction of the patient, or of later complication, without mention of misadventure at the time of the procedure: Secondary | ICD-10-CM | POA: Diagnosis present

## 2021-05-19 DIAGNOSIS — E785 Hyperlipidemia, unspecified: Secondary | ICD-10-CM | POA: Diagnosis present

## 2021-05-19 DIAGNOSIS — N719 Inflammatory disease of uterus, unspecified: Secondary | ICD-10-CM

## 2021-05-19 DIAGNOSIS — K5900 Constipation, unspecified: Secondary | ICD-10-CM | POA: Diagnosis present

## 2021-05-19 HISTORY — DX: Inflammatory disease of uterus, unspecified: N71.9

## 2021-05-19 LAB — CBC
HCT: 39.4 % (ref 36.0–46.0)
Hemoglobin: 12.9 g/dL (ref 12.0–15.0)
MCH: 29.7 pg (ref 26.0–34.0)
MCHC: 32.7 g/dL (ref 30.0–36.0)
MCV: 90.6 fL (ref 80.0–100.0)
Platelets: 501 10*3/uL — ABNORMAL HIGH (ref 150–400)
RBC: 4.35 MIL/uL (ref 3.87–5.11)
RDW: 12.6 % (ref 11.5–15.5)
WBC: 25.3 10*3/uL — ABNORMAL HIGH (ref 4.0–10.5)
nRBC: 0 % (ref 0.0–0.2)

## 2021-05-19 LAB — URINALYSIS, ROUTINE W REFLEX MICROSCOPIC
Bacteria, UA: NONE SEEN
Bilirubin Urine: NEGATIVE
Glucose, UA: NEGATIVE mg/dL
Ketones, ur: NEGATIVE mg/dL
Nitrite: NEGATIVE
Protein, ur: NEGATIVE mg/dL
RBC / HPF: >50 RBC/hpf — ABNORMAL HIGH (ref 0–5)
Specific Gravity, Urine: 1.021 (ref 1.005–1.030)
pH: 6 (ref 5.0–8.0)

## 2021-05-19 LAB — LIPASE, BLOOD: Lipase: 33 U/L (ref 11–51)

## 2021-05-19 LAB — COMPREHENSIVE METABOLIC PANEL
ALT: 20 U/L (ref 0–44)
AST: 16 U/L (ref 15–41)
Albumin: 4 g/dL (ref 3.5–5.0)
Alkaline Phosphatase: 68 U/L (ref 38–126)
Anion gap: 12 (ref 5–15)
BUN: 10 mg/dL (ref 6–20)
CO2: 20 mmol/L — ABNORMAL LOW (ref 22–32)
Calcium: 9.5 mg/dL (ref 8.9–10.3)
Chloride: 103 mmol/L (ref 98–111)
Creatinine, Ser: 0.72 mg/dL (ref 0.44–1.00)
GFR, Estimated: >60 mL/min (ref >60)
Glucose, Bld: 114 mg/dL — ABNORMAL HIGH (ref 70–99)
Potassium: 4 mmol/L (ref 3.5–5.1)
Sodium: 135 mmol/L (ref 135–145)
Total Bilirubin: 0.3 mg/dL (ref 0.3–1.2)
Total Protein: 7.8 g/dL (ref 6.5–8.1)

## 2021-05-19 LAB — TYPE AND SCREEN
ABO/RH(D): A POS
Antibody Screen: NEGATIVE

## 2021-05-19 LAB — HCG, QUANTITATIVE, PREGNANCY: hCG, Beta Chain, Quant, S: <1 m[IU]/mL (ref <5)

## 2021-05-19 LAB — I-STAT BETA HCG BLOOD, ED (MC, WL, AP ONLY): I-stat hCG, quantitative: 8.5 m[IU]/mL — ABNORMAL HIGH (ref <5)

## 2021-05-19 LAB — ABO/RH: ABO/RH(D): A POS

## 2021-05-19 LAB — RESP PANEL BY RT-PCR (FLU A&B, COVID) ARPGX2
Influenza A by PCR: NEGATIVE
Influenza B by PCR: NEGATIVE
SARS Coronavirus 2 by RT PCR: NEGATIVE

## 2021-05-19 MED ORDER — DOCUSATE SODIUM 100 MG PO CAPS
100.0000 mg | ORAL_CAPSULE | Freq: Two times a day (BID) | ORAL | Status: DC | PRN
  Administered 2021-05-21 – 2021-05-22 (×2): 100 mg via ORAL
  Filled 2021-05-19 (×2): qty 1

## 2021-05-19 MED ORDER — PIPERACILLIN-TAZOBACTAM 3.375 G IVPB
3.3750 g | Freq: Three times a day (TID) | INTRAVENOUS | Status: DC
  Administered 2021-05-19 – 2021-05-21 (×5): 3.375 g via INTRAVENOUS
  Filled 2021-05-19 (×5): qty 50

## 2021-05-19 MED ORDER — PIPERACILLIN-TAZOBACTAM 3.375 G IVPB 30 MIN
3.3750 g | Freq: Once | INTRAVENOUS | Status: AC
  Administered 2021-05-19: 3.375 g via INTRAVENOUS
  Filled 2021-05-19: qty 50

## 2021-05-19 MED ORDER — MELATONIN 5 MG PO TABS
5.0000 mg | ORAL_TABLET | Freq: Every evening | ORAL | Status: DC | PRN
  Administered 2021-05-20: 5 mg via ORAL
  Filled 2021-05-19: qty 1

## 2021-05-19 MED ORDER — FENTANYL CITRATE PF 50 MCG/ML IJ SOSY
50.0000 ug | PREFILLED_SYRINGE | INTRAMUSCULAR | Status: AC | PRN
  Administered 2021-05-19 (×2): 50 ug via INTRAVENOUS
  Filled 2021-05-19 (×2): qty 1

## 2021-05-19 MED ORDER — SODIUM CHLORIDE 0.9% FLUSH: 3.0000 mL | INTRAVENOUS | Status: DC | PRN

## 2021-05-19 MED ORDER — LACTATED RINGERS IV BOLUS
1000.0000 mL | Freq: Once | INTRAVENOUS | Status: AC
  Administered 2021-05-19: 1000 mL via INTRAVENOUS

## 2021-05-19 MED ORDER — IBUPROFEN 600 MG PO TABS
600.0000 mg | ORAL_TABLET | Freq: Four times a day (QID) | ORAL | Status: DC | PRN
  Filled 2021-05-19: qty 1

## 2021-05-19 MED ORDER — FENTANYL CITRATE PF 50 MCG/ML IJ SOSY
50.0000 ug | PREFILLED_SYRINGE | Freq: Once | INTRAMUSCULAR | Status: DC
Start: 2021-05-19 — End: 2021-05-19

## 2021-05-19 MED ORDER — ACETAMINOPHEN 325 MG PO TABS
650.0000 mg | ORAL_TABLET | Freq: Four times a day (QID) | ORAL | Status: DC | PRN
  Administered 2021-05-19 – 2021-05-23 (×3): 650 mg via ORAL
  Filled 2021-05-19 (×4): qty 2

## 2021-05-19 MED ORDER — DIPHENHYDRAMINE HCL 25 MG PO CAPS
25.0000 mg | ORAL_CAPSULE | Freq: Three times a day (TID) | ORAL | Status: DC
  Administered 2021-05-19 – 2021-05-23 (×9): 25 mg via ORAL
  Filled 2021-05-19 (×11): qty 1

## 2021-05-19 MED ORDER — SODIUM CHLORIDE 0.9 % IV SOLN: 250.0000 mL | INTRAVENOUS | Status: DC | PRN

## 2021-05-19 MED ORDER — ONDANSETRON HCL 4 MG/2ML IJ SOLN
4.0000 mg | Freq: Once | INTRAMUSCULAR | Status: AC
  Administered 2021-05-19: 4 mg via INTRAVENOUS
  Filled 2021-05-19: qty 2

## 2021-05-19 MED ORDER — ONDANSETRON HCL 4 MG/2ML IJ SOLN
4.0000 mg | Freq: Four times a day (QID) | INTRAMUSCULAR | Status: DC | PRN
  Administered 2021-05-20: 4 mg via INTRAVENOUS
  Filled 2021-05-19 (×2): qty 2

## 2021-05-19 MED ORDER — SODIUM CHLORIDE 0.9% FLUSH
3.0000 mL | Freq: Two times a day (BID) | INTRAVENOUS | Status: DC
  Administered 2021-05-19 – 2021-05-23 (×8): 3 mL via INTRAVENOUS

## 2021-05-19 MED ORDER — ONDANSETRON HCL 4 MG PO TABS: 4.0000 mg | ORAL_TABLET | Freq: Four times a day (QID) | ORAL | Status: DC | PRN

## 2021-05-19 MED ORDER — FLUTICASONE PROPIONATE 50 MCG/ACT NA SUSP
2.0000 | Freq: Every day | NASAL | Status: DC
  Administered 2021-05-19 – 2021-05-23 (×5): 2 via NASAL
  Filled 2021-05-19: qty 16

## 2021-05-19 MED ORDER — DIPHENHYDRAMINE HCL 25 MG PO CAPS
50.0000 mg | ORAL_CAPSULE | Freq: Once | ORAL | Status: AC
  Administered 2021-05-19: 50 mg via ORAL
  Filled 2021-05-19: qty 2

## 2021-05-19 MED ORDER — FENTANYL CITRATE PF 50 MCG/ML IJ SOSY
50.0000 ug | PREFILLED_SYRINGE | Freq: Once | INTRAMUSCULAR | Status: AC
  Administered 2021-05-19: 50 ug via INTRAVENOUS
  Filled 2021-05-19: qty 1

## 2021-05-19 MED ORDER — IOHEXOL 300 MG/ML  SOLN
80.0000 mL | Freq: Once | INTRAMUSCULAR | Status: AC | PRN
Start: 2021-05-19 — End: 2021-05-19
  Administered 2021-05-19: 80 mL via INTRAVENOUS

## 2021-05-19 MED ORDER — ALUM & MAG HYDROXIDE-SIMETH 200-200-20 MG/5ML PO SUSP: 30.0000 mL | ORAL | Status: DC | PRN

## 2021-05-19 MED ORDER — OXYCODONE HCL 5 MG PO TABS
5.0000 mg | ORAL_TABLET | ORAL | Status: DC | PRN
  Administered 2021-05-19 – 2021-05-21 (×8): 10 mg via ORAL
  Filled 2021-05-19 (×10): qty 2

## 2021-05-19 MED ORDER — SIMETHICONE 80 MG PO CHEW
80.0000 mg | CHEWABLE_TABLET | Freq: Four times a day (QID) | ORAL | Status: DC | PRN
  Administered 2021-05-21: 80 mg via ORAL
  Filled 2021-05-19: qty 1

## 2021-05-19 MED ORDER — ACETAMINOPHEN 325 MG PO TABS
650.0000 mg | ORAL_TABLET | Freq: Once | ORAL | Status: AC
  Administered 2021-05-19: 650 mg via ORAL
  Filled 2021-05-19: qty 2

## 2021-05-19 NOTE — ED Provider Notes (Signed)
[facility] EMER[facility] DEPARTMENT  Provider Note  CSN: [id number] Arrival date & time: 05/19/21 0745  History Chief Complaint  Patient presents with   Vaginal Bleeding   Abdominal Pain    Elaine [name] is a 46 y.o. female with history of abnormal vaginal bleeding, followed by Dr. Kennon Rounds, had D&C and ablation on 1/17. Was doing well post-op for a couple of weeks but in the last week has had increasing lower abdominal pain, she thought might be a UTI (but not improved with macrobid given by telehealth visit). Also thought it might be her period because she had some spotting. During the night last night her pain increased significantly and then this morning she felt a 'pop' and had a large amount of foul smelling vaginal bleeding. She has not had a fever, some nausea but no vomiting.    Home Medications Prior to Admission medications   Medication Sig Start Date End Date Taking? Authorizing Provider  Cholecalciferol (VITAMIN D3 PO) Take 1 capsule by mouth daily.   Yes [provider]  fluticasone (FLONASE) 50 MCG/ACT nasal spray Place 2 sprays into both nostrils daily.   Yes [provider]  MAGNESIUM PO Take 1 capsule by mouth daily.   Yes [provider]  Melatonin 5 MG TABS Take 5 mg by mouth at bedtime as needed (Sleep).   Yes [provider]  meloxicam (MOBIC) 15 MG tablet Take 15 mg by mouth daily.   Yes [provider]  mometasone (ELOCON) 0.1 % cream Apply 1 application topically daily. 08/15/20  Yes Pleas Koch, NP  norethindrone (AYGESTIN) 5 MG tablet TAKE 2 TABLETS BY MOUTH EVERY DAY 05/14/21  Yes [name], MD  oxyCODONE-acetaminophen (PERCOCET/ROXICET) 5-325 MG tablet Take 1-2 tablets by mouth every 6 (six) hours as needed. 04/16/21  Yes [name], MD  rosuvastatin (CRESTOR) 5 MG tablet Take 1 tablet (5 mg total) by mouth daily. 07/12/20  Yes Nahser, Wonda Cheng, MD  zolmitriptan (ZOMIG) 5 MG tablet Take 1  tablet by mouth at migraine onset, may repeat 2 hours later if migraine persists. Do not exceed 2 tablets in 24 hours. 04/02/21  Yes Pleas Koch, NP  nitrofurantoin, macrocrystal-monohydrate, (MACROBID) 100 MG capsule Take 100 mg by mouth 2 (two) times daily. Patient not taking: Reported on 05/19/2021 05/13/21   [provider]     Allergies    Other, Cephalexin, Dilaudid  [hydromorphone hcl], and Penicillins   Review of Systems   Review of Systems Please see HPI for pertinent positives and negatives  Physical Exam BP (!) 141/90    Pulse 86    Temp 98.3 F (36.8 C) (Oral)    Resp (!) 21    Ht 5\' 11"  (1.803 m)    Wt 94.3 kg    SpO2 96%    BMI 29.01 kg/m   Physical Exam Vitals and nursing note reviewed.  Constitutional:      Appearance: Normal appearance.  HENT:     Head: Normocephalic and atraumatic.     Nose: Nose normal.     Mouth/Throat:     Mouth: Mucous membranes are moist.  Eyes:     Extraocular Movements: Extraocular movements intact.     Conjunctiva/sclera: Conjunctivae normal.  Cardiovascular:     Rate and Rhythm: Normal rate.  Pulmonary:     Effort: Pulmonary effort is normal.     Breath sounds: Normal breath sounds.  Abdominal:     General: Abdomen  is flat.     Palpations: Abdomen is soft.     Tenderness: There is abdominal tenderness in the right lower quadrant, suprapubic area and left lower quadrant. There is guarding. Negative signs include Murphy's sign and McBurney's sign.  Musculoskeletal:        General: No swelling. Normal range of motion.     Cervical back: Neck supple.  Skin:    General: Skin is warm and dry.  Neurological:     General: No focal deficit present.     Mental Status: She is alert.  Psychiatric:        Mood and Affect: Mood normal.    ED Results / Procedures / Treatments   EKG None  Procedures Procedures  Medications Ordered in the ED Medications  melatonin tablet 5 mg (has no administration in time range)   fluticasone (FLONASE) 50 MCG/ACT nasal spray 2 spray (has no administration in time range)  sodium chloride flush (NS) 0.9 % injection 3 mL (3 mLs Intravenous Not Given 05/19/21 1135)  sodium chloride flush (NS) 0.9 % injection 3 mL (has no administration in time range)  0.9 %  sodium chloride infusion (has no administration in time range)  ibuprofen (ADVIL) tablet 600 mg (has no administration in time range)  oxyCODONE (Oxy IR/ROXICODONE) immediate release tablet 5-10 mg (has no administration in time range)  acetaminophen (TYLENOL) tablet 650 mg (has no administration in time range)  ondansetron (ZOFRAN) tablet 4 mg (has no administration in time range)    Or  ondansetron (ZOFRAN) injection 4 mg (has no administration in time range)  simethicone (MYLICON) chewable tablet 80 mg (has no administration in time range)  alum & mag hydroxide-simeth (MAALOX/MYLANTA) 200-200-20 MG/5ML suspension 30 mL (has no administration in time range)  docusate sodium (COLACE) capsule 100 mg (has no administration in time range)  ondansetron (ZOFRAN) injection 4 mg (4 mg Intravenous Given 05/19/21 0941)  lactated ringers bolus 1,000 mL (1,000 mLs Intravenous New Bag/Given 05/19/21 0942)  acetaminophen (TYLENOL) tablet 650 mg (650 mg Oral Given 05/19/21 0942)  iohexol (OMNIPAQUE) 300 MG/ML solution 80 mL (80 mLs Intravenous Contrast Given 05/19/21 1010)  fentaNYL (SUBLIMAZE) injection 50 mcg (50 mcg Intravenous Given 05/19/21 1133)    Initial Impression and Plan  Patient with lower abdominal pain, very tender to palpation, after recent D&C and ablation. Labs done from triage show CBC with leukocytosis to 25, no significant anemia. CMP is unremarkable. UA with blood but no infection. I spoke with Dr. Ilda Basset, Gyn, who is concerned about endometritis. Recommends a CT Abd/Pel to look for abscess. Patient would like some nausea medication but declines pain medications at this time.   ED Course   Clinical Course as of  05/19/21 1141  Sun May 19, 2021  1104 I personally viewed the images from radiology studies and agree with radiologist interpretation: consistent with endometritis. I also reviewed the images with Dr. Ilda Basset, [place], who requests we start Zosyn and he will come evaluate for admission.  [CS]  1113 Patient would now like some pain medications as well.  [CS]    Clinical Course User Index [CS] Truddie Hidden, MD     MDM Rules/Calculators/A&P Medical Decision Making Problems Addressed: Endometritis: acute illness or injury that poses a threat to life or bodily functions  Amount and/or Complexity of Data Reviewed Labs: ordered. Decision-making details documented in ED Course. Radiology: ordered and independent interpretation performed. Decision-making details documented in ED Course.  Risk OTC drugs. Prescription drug management. Decision  regarding hospitalization.    Final Clinical Impression(s) / ED Diagnoses Final diagnoses:  Endometritis    Rx / DC Orders ED Discharge Orders     None        Truddie Hidden, MD 05/19/21 1141

## 2021-05-19 NOTE — Progress Notes (Signed)
Pharmacy Antibiotic Note  Elaine [name] is a 46 y.o. female admitted on 05/19/2021 with  endometritis .  Pharmacy has been consulted for Zosyn dosing.   Of note, patient has a h/o oral blisters to penicillins and cephalosporins. Per discussion with patient, she is willing to try Zosyn this time. Will order antihistamine to mitigate allergy per discussion with ED and OBGYN  WBC elevated. SCr wnl. CrCl > 100 mL/min   Plan: -Zosyn 3.375 gm IV Q 8 hours (EI infusion) -Administer benadryl with each dose of zosyn to mitigate oral blisters per discussion with MD. Monitor for oversedation -Monitor CBC, renal fx, cultures and clinical progress  Height: 5\' 11"  (180.3 cm) Weight: 94.3 kg (208 lb) IBW/kg (Calculated) : 70.8  Temp (24hrs), Avg:98.9 F (37.2 C), Min:98.3 F (36.8 C), Max:99.5 F (37.5 C)  Recent Labs  Lab 05/19/21 0802  WBC 25.3*  CREATININE 0.72    Estimated Creatinine Clearance: 113.6 mL/min (by C-G formula based on SCr of 0.72 mg/dL).    Allergies  Allergen Reactions   Other Nausea And Vomiting    Opioids   Cephalexin Other (See Comments)    Blisters   Dilaudid  [Hydromorphone Hcl] Nausea And Vomiting   Penicillins Rash    Blisters    Antimicrobials this admission: Zosyn 2/19 >>   Dose adjustments this admission:   Microbiology results:   Thank you for allowing pharmacy to be a part of this patients care.  Albertina Parr, PharmD., BCCCP Clinical Pharmacist Please refer to [facility] for unit-specific pharmacist

## 2021-05-19 NOTE — ED Notes (Signed)
Return from CT

## 2021-05-19 NOTE — ED Triage Notes (Signed)
Pt states it felt like a "balloon popped" inside her and she started having heavy vaginal bleeding and lower abd pain 30 min ago.  Pt had D&C with ablation on 1/17.

## 2021-05-19 NOTE — Plan of Care (Signed)
?  Problem: Nutrition: ?Goal: Adequate nutrition will be maintained ?Outcome: Progressing ?  ?Problem: Safety: ?Goal: Ability to remain free from injury will improve ?Outcome: Progressing ?  ?Problem: Pain Managment: ?Goal: General experience of comfort will improve ?Outcome: Not Progressing ?  ?

## 2021-05-19 NOTE — H&P (Signed)
Obstetrics & Gynecology H&P   Date of Admission: 05/19/2021   Requesting Provider: [facility]  Primary OBGYN: [facility] (Dr. Kennon Rounds) Primary Care Provider: Pleas Koch  Reason for Admission: post op endometritis  History of Present Illness: Elaine [name] is a 46 y.o. s/p 1/17 hysteroscopy, d&c and HTA ablation. She had a virtual post op visit with Dr. Kennon Rounds on 2/2 and she was doing well and w/o issue and plan was to taper off her Aygestin. Final pathology negative.   Patient states she had constant AUB from mid 2022 until her surgery. After her surgery, she states she had no bleeding, really any pain and no discharge until yesterday; pt unsure of when she had periods during this time. Starting yesterday, she started having VB with clots and foul smelling discharge and diffuse abdominal pain with some nausea, which brought her into the hospital.   In the ED, pt with WBC of 26 and low grade temp (97.5) and tachy to the 120s that responded to 1L IVF bolus. I was consulted and recommended CT scan which showed findings concerning for endometritis (see below).  She states she was still taking Aygestin 5 qday and she was released off pelvic rest and she and her partner had sex  two weeks ago w/o issue. No lower urinary tract s/s  ROS: A 12-point review of systems was performed and negative, except as stated in the above HPI.  OBGYN History: As per HPI. OB History  Gravida Para Term Preterm AB Living  3 1 1   2     SAB IAB Ectopic Multiple Live Births  2       1    # Outcome Date GA Lbr Len/2nd Weight Sex Delivery Anes PTL Lv  3 SAB           2 SAB           1 Term              Past Medical History: Past Medical History:  Diagnosis Date   ADHD (attention deficit hyperactivity disorder)    Alpha-1-antitrypsin deficiency (HCC)    Arthritis    Asthma    Bulging lumbar disc    Diverticulitis    with sepsis   Diverticulosis    Family history  of adverse reaction to anesthesia    malignant Hyperthermia- father and sister   Fibroids    Vaginal   GERD (gastroesophageal reflux disease)    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[place])    No compelte work up, but suspected   Headache    Heart murmur    " Muscial " murmer -   History of hiatal hernia    Hyperlipidemia    Malignant hyperthermia    Orthostatic hypotension    Psoriasis    Ruptured ovarian cyst 2004,2016   Seizures ([place])    none since age 40    Past Surgical History: Past Surgical History:  Procedure Laterality Date   DENTAL SURGERY     DILITATION & CURRETTAGE/HYSTROSCOPY WITH HYDROTHERMAL ABLATION N/A 04/16/2021   Procedure: DILATATION & CURETTAGE/HYSTEROSCOPY WITH HYDROTHERMAL ABLATION;  Surgeon: [name], MD;  Location: [place];  Service: Gynecology;  Laterality: N/A;  rep will be here confirmed on 04/08/21 CS   LAPAROSCOPIC SIGMOID COLECTOMY  2013    Family History:  Family History  Problem Relation Age of Onset   Stroke Father 37   Heart attack Father 52   Hyperlipidemia Father    Prostate  cancer Father    Stomach cancer Neg Hx    Colon polyps Neg Hx    Esophageal cancer Neg Hx    Pancreatic cancer Neg Hx      Social History:  Social History   Socioeconomic History   Marital status: Married    Spouse name: Not on file   Number of children: Not on file   Years of education: Not on file   Highest education level: Not on file  Occupational History   Not on file  Tobacco Use   Smoking status: Former    Packs/day: 0.00    Years: 5.00    Pack years: 0.00    Types: Cigarettes   Smokeless tobacco: Never  Vaping Use   Vaping Use: Never used  Substance and Sexual Activity   Alcohol use: Yes    Comment: ocassional- 1-2 drinks   Drug use: Never   Sexual activity: Yes  Other Topics Concern   Not on file  Social History Narrative   Married.   1 child.   Works as a Psychologist, sport and exercise.    Social Determinants of Health   Financial Resource Strain: Not on file  Food  Insecurity: No Food Insecurity   Worried About Charity fundraiser in the Last Year: Never true   Ran Out of Food in the Last Year: Never true  Transportation Needs: No Transportation Needs   Lack of Transportation (Medical): No   Lack of Transportation (Non-Medical): No  Physical Activity: Not on file  Stress: Not on file  Social Connections: Not on file  Intimate Partner Violence: Not on file    Allergy: Allergies  Allergen Reactions   Other Nausea And Vomiting    Opioids   Cephalexin Other (See Comments)    Blisters   Dilaudid  [Hydromorphone Hcl] Nausea And Vomiting   Penicillins Rash    Blisters    Hospital Medications: Current Facility-Administered Medications  Medication Dose Route Frequency Provider Last Rate Last Admin   0.9 %  sodium chloride infusion  250 mL Intravenous PRN Aletha Halim, MD       acetaminophen (TYLENOL) tablet 650 mg  650 mg Oral Q6H PRN Aletha Halim, MD       alum & mag hydroxide-simeth (MAALOX/MYLANTA) 200-200-20 MG/5ML suspension 30 mL  30 mL Oral Q4H PRN Aletha Halim, MD       docusate sodium (COLACE) capsule 100 mg  100 mg Oral BID PRN Aletha Halim, MD       fluticasone (FLONASE) 50 MCG/ACT nasal spray 2 spray  2 spray Each Nare Daily Aletha Halim, MD       ibuprofen (ADVIL) tablet 600 mg  600 mg Oral Q6H PRN Aletha Halim, MD       melatonin tablet 5 mg  5 mg Oral QHS PRN Aletha Halim, MD       ondansetron (ZOFRAN) tablet 4 mg  4 mg Oral Q6H PRN Aletha Halim, MD       Or   ondansetron (ZOFRAN) injection 4 mg  4 mg Intravenous Q6H PRN Aletha Halim, MD       oxyCODONE (Oxy IR/ROXICODONE) immediate release tablet 5-10 mg  5-10 mg Oral Q4H PRN Aletha Halim, MD       simethicone (MYLICON) chewable tablet 80 mg  80 mg Oral QID PRN Aletha Halim, MD       sodium chloride flush (NS) 0.9 % injection 3 mL  3 mL Intravenous Q12H Aletha Halim, MD  sodium chloride flush (NS) 0.9 % injection 3 mL  3 mL  Intravenous PRN Aletha Halim, MD       Current Outpatient Medications  Medication Sig Dispense Refill   Cholecalciferol (VITAMIN D3 PO) Take 1 capsule by mouth daily.     fluticasone (FLONASE) 50 MCG/ACT nasal spray Place 2 sprays into both nostrils daily.     MAGNESIUM PO Take 1 capsule by mouth daily.     Melatonin 5 MG TABS Take 5 mg by mouth at bedtime as needed (Sleep).     meloxicam (MOBIC) 15 MG tablet Take 15 mg by mouth daily.     mometasone (ELOCON) 0.1 % cream Apply 1 application topically daily. 45 g 0   norethindrone (AYGESTIN) 5 MG tablet TAKE 2 TABLETS BY MOUTH EVERY DAY 180 tablet 1   oxyCODONE-acetaminophen (PERCOCET/ROXICET) 5-325 MG tablet Take 1-2 tablets by mouth every 6 (six) hours as needed. 8 tablet 0   rosuvastatin (CRESTOR) 5 MG tablet Take 1 tablet (5 mg total) by mouth daily. 90 tablet 3   zolmitriptan (ZOMIG) 5 MG tablet Take 1 tablet by mouth at migraine onset, may repeat 2 hours later if migraine persists. Do not exceed 2 tablets in 24 hours. 10 tablet 0   nitrofurantoin, macrocrystal-monohydrate, (MACROBID) 100 MG capsule Take 100 mg by mouth 2 (two) times daily. (Patient not taking: Reported on 05/19/2021)       Physical Exam:  Current Vital Signs 24h Vital Sign Ranges  T 98.3 F (36.8 C) Temp  Avg: 98.9 F (37.2 C)  Min: 98.3 F (36.8 C)  Max: 99.5 F (37.5 C)  BP (!) 141/90  BP  Min: 117/93  Max: 143/93  HR 86 Pulse  Avg: 104.3  Min: 86  Max: 126  RR (!) 21  Resp  Avg: 19.3  Min: 17  Max: 21  SaO2 96 % Room Air SpO2  Avg: 97.3 %  Min: 96 %  Max: 98 %       24 Hour I/O Current Shift I/O  Time Ins Outs No intake/output data recorded. No intake/output data recorded.   Patient Vitals for the past 24 hrs:  BP Temp Temp src Pulse Resp SpO2 Height Weight  05/19/21 1125 (!) 141/90 -- -- 86 (!) 21 96 % -- --  05/19/21 1120 -- 98.3 F (36.8 C) Oral -- -- -- -- --  05/19/21 0900 (!) 117/93 -- -- 100 (!) 21 98 % -- --  05/19/21 0845 (!) 138/91 --  -- (!) 105 17 98 % -- --  05/19/21 0838 -- -- -- -- -- -- 5\' 11"  (1.803 m) 94.3 kg  05/19/21 0750 (!) 143/93 99.5 F (37.5 C) Oral (!) 126 18 97 % -- --    Body mass index is 29.01 kg/m. General appearance: Well nourished, well developed female mildly anxious Cardiovascular: S1, S2 normal, no murmur, rub or gallop, regular rate and rhythm Respiratory:  Clear to auscultation bilateral. Normal respiratory effort Abdomen: +BS, non distended, minimally ttp, no peritoneal s/s Back: no CVAT Neuro/Psych:  Normal mood and affect.  Skin:  Warm and dry.  Extremities: no clubbing, cyanosis, or edema.  Lymphatic:  No inguinal lymphadenopathy.   Pelvic Exam: Pt states she's had a pad on for an hour>>scant amount of BRB on pad>>pt states she feels bleeding has improved  Laboratory: Istat hcg: 8.5  Recent Labs  Lab 05/19/21 0802  WBC 25.3*  HGB 12.9  HCT 39.4  PLT 501*   Recent Labs  Lab 05/19/21 0802  NA 135  K 4.0  CL 103  CO2 20*  BUN 10  CREATININE 0.72  CALCIUM 9.5  PROT 7.8  BILITOT 0.3  ALKPHOS 68  ALT 20  AST 16  GLUCOSE 114*   U/A: with blood and small ueks  Imaging:   Images reviewed by me  Narrative & Impression  CLINICAL DATA:  Four week status post dilatation and curettage. Now with abnormal, foul-smelling uterine bleeding, leukocytosis, and low-grade fever.   EXAM: CT ABDOMEN AND PELVIS WITH CONTRAST   TECHNIQUE: Multidetector CT imaging of the abdomen and pelvis was performed using the standard protocol following bolus administration of intravenous contrast.   RADIATION DOSE REDUCTION: This exam was performed according to the departmental dose-optimization program which includes automated exposure control, adjustment of the mA and/or kV according to patient size and/or use of iterative reconstruction technique.   CONTRAST:  15mL OMNIPAQUE IOHEXOL 300 MG/ML  SOLN   COMPARISON:  None.   FINDINGS: Lower chest: Subsegmental atelectasis or scar  noted within the inferior lingula.   Hepatobiliary: No focal liver abnormality is seen. No gallstones, gallbladder wall thickening, or biliary dilatation.   Pancreas: Unremarkable. No pancreatic ductal dilatation or surrounding inflammatory changes.   Spleen: Normal in size without focal abnormality.   Adrenals/Urinary Tract: Normal adrenal glands.   No kidney mass, nephrolithiasis or hydronephrosis identified bilaterally. The bladder is unremarkable.   Stomach/Bowel: Stomach is within normal limits. Appendix appears normal. No evidence of bowel wall thickening, distention, or inflammatory changes.   Vascular/Lymphatic: No significant vascular findings are present. No enlarged abdominal or pelvic lymph nodes.   Reproductive: The uterus is enlarged and there is diffuse heterogeneous thickening of the endometrial cavity which measures 5.1 by 8.2 by 5.9 cm, image 55/7.   Other: There is complex fluid noted within the right and posterior pelvis, image 78/3.   Musculoskeletal: No acute or significant osseous findings.   IMPRESSION: 1. The uterus it is enlarged with diffuse heterogeneous distension of the endometrial cavity. In the setting of recent endometrial instrumentation imaging findings are concerning for endometritis. Underlying retained products of conception cannot be excluded. 2. Complex fluid noted within the right and posterior pelvis. 3. No extra uterine fluid collections identified.     Electronically Signed   By: Kerby Moors M.D.   On: 05/19/2021 10:27   Narrative & Impression  CLINICAL DATA:  Abnormal uterine bleeding, history of fibroids, ovarian cysts, LMP 12/28/2020   EXAM: TRANSABDOMINAL AND TRANSVAGINAL ULTRASOUND OF PELVIS   TECHNIQUE: Both transabdominal and transvaginal ultrasound examinations of the pelvis were performed. Transabdominal technique was performed for global imaging of the pelvis including uterus, ovaries, adnexal regions,  and pelvic cul-de-sac. It was necessary to proceed with endovaginal exam following the transabdominal exam to visualize the endometrium and LEFT ovary.   COMPARISON:  None   FINDINGS: Uterus   Measurements: 9.4 x 7.0 x 8.7 cm = volume: 300 mL. Anteverted. Heterogeneous myometrium. Nabothian cyst at cervix. Large partially shadowing heterogeneous leiomyoma at anterior LEFT uterus 6.4 x 4.7 x 7.1 cm. No additional masses.   Endometrium   Thickness: Poorly demonstrated due to uterine heterogeneity, displacement, and shadowing from large leiomyoma, questionably 9 mm thick. Suboptimally visualized due to displacement by the large leiomyoma anterior LEFT. No definite endometrial fluid or mass.   Right ovary   Measurements: 3.5 x 2.4 x 3.1 cm = volume: 13.5 mL. Normal morphology without mass   Left ovary   Measurements: 3.2 x 1.7  x 1.7 cm = volume: 4.7 mL. Normal morphology without mass   Other findings   No free pelvic fluid.  No adnexal masses.   IMPRESSION: 7.1 cm diameter LEFT anterior uterine leiomyoma.   Unremarkable ovaries.   Poorly defined endometrial complex questionably 9 mm thick, displaced by leiomyoma.     Electronically Signed   By: Lavonia Dana M.D.   On: 01/14/2021 19:09   Assessment: patient stable  Plan: Admit to GYN. Start on Zosyn. Repeat cbc in am. Formal hcg ordered. I reviewed her old u/s and CT today and she has a decent sized fibroid so ?necrosing fibroid. Hard to tell on prior u/s and no comment on read but it looks like it could be submucosal.  *FEN/GI: regular diet, SLIV *PPx: SCDs, start lovenox tomorrow if not for any type of surgical procedure *Pain: PO PRNs  Dr. Kennon Rounds made aware  Total time taking care of the patient was 35 minutes, with greater than 50% of the time spent in face to face interaction with the patient.Durene Romans MD Attending [facility]) GYN Consult Phone:  [phone] (M-F, 0800-1700) & [phone] (Off hours, weekends, holidays)

## 2021-05-19 NOTE — ED Notes (Signed)
Lunch tray delivered.

## 2021-05-20 LAB — CBC WITH DIFFERENTIAL/PLATELET
Abs Immature Granulocytes: 0.05 10*3/uL (ref 0.00–0.07)
Basophils Absolute: 0.1 10*3/uL (ref 0.0–0.1)
Basophils Relative: 1 %
Eosinophils Absolute: 0.2 10*3/uL (ref 0.0–0.5)
Eosinophils Relative: 2 %
HCT: 31.9 % — ABNORMAL LOW (ref 36.0–46.0)
Hemoglobin: 10.4 g/dL — ABNORMAL LOW (ref 12.0–15.0)
Immature Granulocytes: 0 %
Lymphocytes Relative: 20 %
Lymphs Abs: 3.1 10*3/uL (ref 0.7–4.0)
MCH: 29.3 pg (ref 26.0–34.0)
MCHC: 32.6 g/dL (ref 30.0–36.0)
MCV: 89.9 fL (ref 80.0–100.0)
Monocytes Absolute: 1 10*3/uL (ref 0.1–1.0)
Monocytes Relative: 6 %
Neutro Abs: 10.8 10*3/uL — ABNORMAL HIGH (ref 1.7–7.7)
Neutrophils Relative %: 71 %
Platelets: 392 10*3/uL (ref 150–400)
RBC: 3.55 MIL/uL — ABNORMAL LOW (ref 3.87–5.11)
RDW: 12.7 % (ref 11.5–15.5)
WBC: 15.2 10*3/uL — ABNORMAL HIGH (ref 4.0–10.5)
nRBC: 0 % (ref 0.0–0.2)

## 2021-05-20 LAB — URINE CULTURE: Culture: 20000 — AB

## 2021-05-20 MED ORDER — SODIUM CHLORIDE 0.9 % IV SOLN: 250.0000 mL | INTRAVENOUS | Status: AC | PRN

## 2021-05-20 MED ORDER — HYDROXYZINE HCL 25 MG PO TABS
25.0000 mg | ORAL_TABLET | Freq: Four times a day (QID) | ORAL | Status: DC | PRN
  Administered 2021-05-20: 25 mg via ORAL
  Filled 2021-05-20: qty 1

## 2021-05-20 MED ORDER — FAMOTIDINE 20 MG PO TABS
20.0000 mg | ORAL_TABLET | Freq: Every day | ORAL | Status: DC
  Administered 2021-05-20 – 2021-05-23 (×4): 20 mg via ORAL
  Filled 2021-05-20 (×5): qty 1

## 2021-05-20 NOTE — Progress Notes (Signed)
Assessment/Plan: Principal Problem:   Endometritis  WBC is trending down Pain is improving. Still having some mild bleeding.  Continue Zosyn Add IVF as she is feeling dehydrated  Subjective: Interval History:still with some cramping, better with pain meds and heating pad  Objective: Vital signs in last 24 hours: Temp:  [97.6 F (36.4 C)-99.5 F (37.5 C)] 98.3 F (36.8 C) (02/20 0401) Pulse Rate:  [79-126] 79 (02/20 0401) Resp:  [15-21] 20 (02/20 0401) BP: (114-143)/(65-93) 114/66 (02/20 0401) SpO2:  [96 %-99 %] 97 % (02/20 0401) Weight:  [94.3 kg] 94.3 kg (02/19 0838)  Intake/Output from previous day: 02/19 0701 - 02/20 0700 In: 1150  Out: -  Intake/Output this shift: No intake/output data recorded.  General appearance: alert, cooperative, and appears stated age Head: Normocephalic, without obvious abnormality, atraumatic Neck: supple, symmetrical, trachea midline Lungs: normal effort Heart: regular rate and rhythm Abdomen: soft, mildly tender suprapubically Extremities: Homans sign is negative, no sign of DVT Skin: Skin color, texture, turgor normal. No rashes or lesions Neurologic: Grossly normal  Results for orders placed or performed during the hospital encounter of 05/19/21 (from the past 24 hour(s))  Urinalysis, Routine w reflex microscopic     Status: Abnormal   Collection Time: 05/19/21  7:45 AM  Result Value Ref Range   Color, Urine YELLOW YELLOW   APPearance HAZY (A) CLEAR   Specific Gravity, Urine 1.021 1.005 - 1.030   pH 6.0 5.0 - 8.0   Glucose, UA NEGATIVE NEGATIVE mg/dL   Hgb urine dipstick MODERATE (A) NEGATIVE   Bilirubin Urine NEGATIVE NEGATIVE   Ketones, ur NEGATIVE NEGATIVE mg/dL   Protein, ur NEGATIVE NEGATIVE mg/dL   Nitrite NEGATIVE NEGATIVE   Leukocytes,Ua SMALL (A) NEGATIVE   RBC / HPF >50 (H) 0 - 5 RBC/hpf   WBC, UA 0-5 0 - 5 WBC/hpf   Bacteria, UA NONE SEEN NONE SEEN   Squamous Epithelial / LPF 0-5 0 - 5   Mucus PRESENT    Lipase, blood     Status: None   Collection Time: 05/19/21  8:02 AM  Result Value Ref Range   Lipase 33 11 - 51 U/L  Comprehensive metabolic panel     Status: Abnormal   Collection Time: 05/19/21  8:02 AM  Result Value Ref Range   Sodium 135 135 - 145 mmol/L   Potassium 4.0 3.5 - 5.1 mmol/L   Chloride 103 98 - 111 mmol/L   CO2 20 (L) 22 - 32 mmol/L   Glucose, Bld 114 (H) 70 - 99 mg/dL   BUN 10 6 - 20 mg/dL   Creatinine, Ser 0.72 0.44 - 1.00 mg/dL   Calcium 9.5 8.9 - 10.3 mg/dL   Total Protein 7.8 6.5 - 8.1 g/dL   Albumin 4.0 3.5 - 5.0 g/dL   AST 16 15 - 41 U/L   ALT 20 0 - 44 U/L   Alkaline Phosphatase 68 38 - 126 U/L   Total Bilirubin 0.3 0.3 - 1.2 mg/dL   GFR, Estimated >60 >60 mL/min   Anion gap 12 5 - 15  CBC     Status: Abnormal   Collection Time: 05/19/21  8:02 AM  Result Value Ref Range   WBC 25.3 (H) 4.0 - 10.5 K/uL   RBC 4.35 3.87 - 5.11 MIL/uL   Hemoglobin 12.9 12.0 - 15.0 g/dL   HCT 39.4 36.0 - 46.0 %   MCV 90.6 80.0 - 100.0 fL   MCH 29.7 26.0 - 34.0 pg  MCHC 32.7 30.0 - 36.0 g/dL   RDW 12.6 11.5 - 15.5 %   Platelets 501 (H) 150 - 400 K/uL   nRBC 0.0 0.0 - 0.2 %  ABO/Rh     Status: None   Collection Time: 05/19/21  8:02 AM  Result Value Ref Range   ABO/RH(D)      A POS Performed at [facility] [address], [place], [place] [address]   I-Stat beta hCG blood, ED     Status: Abnormal   Collection Time: 05/19/21  8:22 AM  Result Value Ref Range   I-stat hCG, quantitative 8.5 (H) <5 mIU/mL   Comment 3          Type and screen [facility]     Status: None   Collection Time: 05/19/21 12:00 PM  Result Value Ref Range   ABO/RH(D) NN^NOT NEEDED    Antibody Screen NOT NEEDED    Sample Expiration 05/19/2021,2359   Resp Panel by RT-PCR (Flu A&B, Covid) Nasopharyngeal Swab     Status: None   Collection Time: 05/19/21 12:28 PM   Specimen: Nasopharyngeal Swab; Nasopharyngeal(NP) swabs in vial transport medium  Result Value Ref Range    SARS Coronavirus 2 by RT PCR NEGATIVE NEGATIVE   Influenza A by PCR NEGATIVE NEGATIVE   Influenza B by PCR NEGATIVE NEGATIVE  hCG, quantitative, pregnancy     Status: None   Collection Time: 05/19/21  2:19 PM  Result Value Ref Range   hCG, Beta Chain, Quant, S <1 <5 mIU/mL  Type and screen     Status: None   Collection Time: 05/19/21  2:28 PM  Result Value Ref Range   ABO/RH(D) A POS    Antibody Screen NEG    Sample Expiration      05/22/2021,2359 Performed at [facility], [place] [address] [place], [place] [address]   CBC WITH DIFFERENTIAL     Status: Abnormal   Collection Time: 05/20/21 12:57 AM  Result Value Ref Range   WBC 15.2 (H) 4.0 - 10.5 K/uL   RBC 3.55 (L) 3.87 - 5.11 MIL/uL   Hemoglobin 10.4 (L) 12.0 - 15.0 g/dL   HCT 31.9 (L) 36.0 - 46.0 %   MCV 89.9 80.0 - 100.0 fL   MCH 29.3 26.0 - 34.0 pg   MCHC 32.6 30.0 - 36.0 g/dL   RDW 12.7 11.5 - 15.5 %   Platelets 392 150 - 400 K/uL   nRBC 0.0 0.0 - 0.2 %   Neutrophils Relative % 71 %   Neutro Abs 10.8 (H) 1.7 - 7.7 K/uL   Lymphocytes Relative 20 %   Lymphs Abs 3.1 0.7 - 4.0 K/uL   Monocytes Relative 6 %   Monocytes Absolute 1.0 0.1 - 1.0 K/uL   Eosinophils Relative 2 %   Eosinophils Absolute 0.2 0.0 - 0.5 K/uL   Basophils Relative 1 %   Basophils Absolute 0.1 0.0 - 0.1 K/uL   Immature Granulocytes 0 %   Abs Immature Granulocytes 0.05 0.00 - 0.07 K/uL    Studies/Results: DG Chest 2 View  Result Date: 05/08/2021 CLINICAL DATA:  Alpha 1 antitrypsin deficiency. EXAM: CHEST - 2 VIEW COMPARISON:  Chest radiograph 10/08/2020. FINDINGS: Stable cardiac and mediastinal contours. Pectus excavatum deformity. No large area of pulmonary consolidation. No pleural effusion or pneumothorax. Osseous structures unremarkable. IMPRESSION: No acute cardiopulmonary process. Electronically Signed   By: Lovey Newcomer M.D.   On: 05/08/2021 00:13   CT Abdomen Pelvis  W Contrast  Result Date: 05/19/2021 CLINICAL DATA:  Four week status  post dilatation and curettage. Now with abnormal, foul-smelling uterine bleeding, leukocytosis, and low-grade fever. EXAM: CT ABDOMEN AND PELVIS WITH CONTRAST TECHNIQUE: Multidetector CT imaging of the abdomen and pelvis was performed using the standard protocol following bolus administration of intravenous contrast. RADIATION DOSE REDUCTION: This exam was performed according to the departmental dose-optimization program which includes automated exposure control, adjustment of the mA and/or kV according to patient size and/or use of iterative reconstruction technique. CONTRAST:  35mL OMNIPAQUE IOHEXOL 300 MG/ML  SOLN COMPARISON:  None. FINDINGS: Lower chest: Subsegmental atelectasis or scar noted within the inferior lingula. Hepatobiliary: No focal liver abnormality is seen. No gallstones, gallbladder wall thickening, or biliary dilatation. Pancreas: Unremarkable. No pancreatic ductal dilatation or surrounding inflammatory changes. Spleen: Normal in size without focal abnormality. Adrenals/Urinary Tract: Normal adrenal glands. No kidney mass, nephrolithiasis or hydronephrosis identified bilaterally. The bladder is unremarkable. Stomach/Bowel: Stomach is within normal limits. Appendix appears normal. No evidence of bowel wall thickening, distention, or inflammatory changes. Vascular/Lymphatic: No significant vascular findings are present. No enlarged abdominal or pelvic lymph nodes. Reproductive: The uterus is enlarged and there is diffuse heterogeneous thickening of the endometrial cavity which measures 5.1 by 8.2 by 5.9 cm, image 55/7. Other: There is complex fluid noted within the right and posterior pelvis, image 78/3. Musculoskeletal: No acute or significant osseous findings. IMPRESSION: 1. The uterus it is enlarged with diffuse heterogeneous distension of the endometrial cavity. In the setting of recent endometrial instrumentation imaging findings are concerning for endometritis. Underlying retained products of  conception cannot be excluded. 2. Complex fluid noted within the right and posterior pelvis. 3. No extra uterine fluid collections identified. Electronically Signed   By: Kerby Moors M.D.   On: 05/19/2021 10:27    Scheduled Meds:  diphenhydrAMINE  25 mg Oral Q8H   fluticasone  2 spray Each Nare Daily   sodium chloride flush  3 mL Intravenous Q12H   Continuous Infusions:  sodium chloride     piperacillin-tazobactam (ZOSYN)  IV 3.375 g (05/20/21 0352)   PRN Meds:sodium chloride, acetaminophen, alum & mag hydroxide-simeth, docusate sodium, ibuprofen, melatonin, ondansetron **OR** ondansetron (ZOFRAN) IV, oxyCODONE, simethicone, sodium chloride flush    LOS: 1 day   [name], MD 05/20/2021 7:16 AM

## 2021-05-21 ENCOUNTER — Inpatient Hospital Stay (HOSPITAL_COMMUNITY): Payer: 59

## 2021-05-21 LAB — CBC
HCT: 35.5 % — ABNORMAL LOW (ref 36.0–46.0)
Hemoglobin: 11.6 g/dL — ABNORMAL LOW (ref 12.0–15.0)
MCH: 29.2 pg (ref 26.0–34.0)
MCHC: 32.7 g/dL (ref 30.0–36.0)
MCV: 89.4 fL (ref 80.0–100.0)
Platelets: 462 10*3/uL — ABNORMAL HIGH (ref 150–400)
RBC: 3.97 MIL/uL (ref 3.87–5.11)
RDW: 12.6 % (ref 11.5–15.5)
WBC: 16.1 10*3/uL — ABNORMAL HIGH (ref 4.0–10.5)
nRBC: 0 % (ref 0.0–0.2)

## 2021-05-21 MED ORDER — POLYETHYLENE GLYCOL 3350 17 G PO PACK
17.0000 g | PACK | Freq: Every day | ORAL | Status: DC
  Administered 2021-05-21 – 2021-05-22 (×2): 17 g via ORAL
  Filled 2021-05-21 (×4): qty 1

## 2021-05-21 MED ORDER — METRONIDAZOLE 500 MG PO TABS
500.0000 mg | ORAL_TABLET | Freq: Two times a day (BID) | ORAL | Status: DC
  Administered 2021-05-21 – 2021-05-24 (×6): 500 mg via ORAL
  Filled 2021-05-21 (×6): qty 1

## 2021-05-21 MED ORDER — CLINDAMYCIN PHOSPHATE 900 MG/50ML IV SOLN
900.0000 mg | Freq: Three times a day (TID) | INTRAVENOUS | Status: DC
Start: 2021-05-21 — End: 2021-05-21
  Administered 2021-05-21: 900 mg via INTRAVENOUS
  Filled 2021-05-21 (×2): qty 50

## 2021-05-21 MED ORDER — SODIUM CHLORIDE 0.9 % IV SOLN
2.0000 g | INTRAVENOUS | Status: DC
  Administered 2021-05-21 – 2021-05-23 (×3): 2 g via INTRAVENOUS
  Filled 2021-05-21 (×3): qty 20

## 2021-05-21 MED ORDER — KETOROLAC TROMETHAMINE 30 MG/ML IJ SOLN
30.0000 mg | Freq: Four times a day (QID) | INTRAMUSCULAR | Status: DC
  Administered 2021-05-21 – 2021-05-22 (×6): 30 mg via INTRAVENOUS
  Filled 2021-05-21 (×7): qty 1

## 2021-05-21 MED ORDER — NORETHINDRONE ACETATE 5 MG PO TABS
10.0000 mg | ORAL_TABLET | Freq: Every day | ORAL | Status: DC
  Administered 2021-05-21 – 2021-05-24 (×4): 10 mg via ORAL
  Filled 2021-05-21 (×4): qty 2

## 2021-05-21 NOTE — Progress Notes (Signed)
Patient ID: Elaine [name], female   DOB: 20-Jun-1975, 46 y.o.   MRN: [id number]   Assessment/Plan: Principal Problem:   Endometritis   Constipation  Still having significant pain - began with heavier bleeding and passing tissue this am Will check u/s Add toradol Change Abx due to significant itching with Zosyn - will review with Pharmacy--switch to [facility] Add Miralax  Subjective: Interval History:Reports significant itching, whole body with Zosyn dosing.  Objective: Vital signs in last 24 hours: Temp:  [98.4 F (36.9 C)-99 F (37.2 C)] 99 F (37.2 C) (02/21 0409) Pulse Rate:  [79-92] 79 (02/21 0409) Resp:  [16-17] 16 (02/21 0409) BP: (120-139)/(64-84) 120/84 (02/21 0409) SpO2:  [98 %-99 %] 98 % (02/21 0409)  Intake/Output from previous day: 02/20 0701 - 02/21 0700 In: 315.5 [P.O.:240; I.V.:3] Out: -  Intake/Output this shift: No intake/output data recorded.  BP 120/84 (BP Location: Left Arm)    Pulse 79    Temp 99 F (37.2 C) (Oral)    Resp 16    Ht 5\' 11"  (1.803 m)    Wt 94.3 kg    SpO2 98%    BMI 29.01 kg/m  General appearance: alert, cooperative, and appears stated age Head: Normocephalic, without obvious abnormality, atraumatic Neck: no adenopathy, supple, symmetrical, trachea midline, and thyroid not enlarged, symmetric, no tenderness/mass/nodules Lungs:  normal effort Heart: regular rate and rhythm Abdomen: soft, non-tender; bowel sounds normal; no masses,  no organomegaly Extremities: extremities normal, atraumatic, no cyanosis or edema Skin: Skin color, texture, turgor normal. No rashes or lesions Neurologic: Grossly normal  Results for orders placed or performed during the hospital encounter of 05/19/21 (from the past 24 hour(s))  CBC     Status: Abnormal   Collection Time: 05/21/21  6:17 AM  Result Value Ref Range   WBC 16.1 (H) 4.0 - 10.5 K/uL   RBC 3.97 3.87 - 5.11 MIL/uL   Hemoglobin 11.6 (L) 12.0 - 15.0 g/dL   HCT 35.5 (L) 36.0 - 46.0 %    MCV 89.4 80.0 - 100.0 fL   MCH 29.2 26.0 - 34.0 pg   MCHC 32.7 30.0 - 36.0 g/dL   RDW 12.6 11.5 - 15.5 %   Platelets 462 (H) 150 - 400 K/uL   nRBC 0.0 0.0 - 0.2 %    Studies/Results: DG Chest 2 View  Result Date: 05/08/2021 CLINICAL DATA:  Alpha 1 antitrypsin deficiency. EXAM: CHEST - 2 VIEW COMPARISON:  Chest radiograph 10/08/2020. FINDINGS: Stable cardiac and mediastinal contours. Pectus excavatum deformity. No large area of pulmonary consolidation. No pleural effusion or pneumothorax. Osseous structures unremarkable. IMPRESSION: No acute cardiopulmonary process. Electronically Signed   By: Lovey Newcomer M.D.   On: 05/08/2021 00:13   CT Abdomen Pelvis W Contrast  Result Date: 05/19/2021 CLINICAL DATA:  Four week status post dilatation and curettage. Now with abnormal, foul-smelling uterine bleeding, leukocytosis, and low-grade fever. EXAM: CT ABDOMEN AND PELVIS WITH CONTRAST TECHNIQUE: Multidetector CT imaging of the abdomen and pelvis was performed using the standard protocol following bolus administration of intravenous contrast. RADIATION DOSE REDUCTION: This exam was performed according to the departmental dose-optimization program which includes automated exposure control, adjustment of the mA and/or kV according to patient size and/or use of iterative reconstruction technique. CONTRAST:  96mL OMNIPAQUE IOHEXOL 300 MG/ML  SOLN COMPARISON:  None. FINDINGS: Lower chest: Subsegmental atelectasis or scar noted within the inferior lingula. Hepatobiliary: No focal liver abnormality is seen. No gallstones, gallbladder wall thickening, or biliary dilatation. Pancreas:  Unremarkable. No pancreatic ductal dilatation or surrounding inflammatory changes. Spleen: Normal in size without focal abnormality. Adrenals/Urinary Tract: Normal adrenal glands. No kidney mass, nephrolithiasis or hydronephrosis identified bilaterally. The bladder is unremarkable. Stomach/Bowel: Stomach is within normal limits. Appendix  appears normal. No evidence of bowel wall thickening, distention, or inflammatory changes. Vascular/Lymphatic: No significant vascular findings are present. No enlarged abdominal or pelvic lymph nodes. Reproductive: The uterus is enlarged and there is diffuse heterogeneous thickening of the endometrial cavity which measures 5.1 by 8.2 by 5.9 cm, image 55/7. Other: There is complex fluid noted within the right and posterior pelvis, image 78/3. Musculoskeletal: No acute or significant osseous findings. IMPRESSION: 1. The uterus it is enlarged with diffuse heterogeneous distension of the endometrial cavity. In the setting of recent endometrial instrumentation imaging findings are concerning for endometritis. Underlying retained products of conception cannot be excluded. 2. Complex fluid noted within the right and posterior pelvis. 3. No extra uterine fluid collections identified. Electronically Signed   By: Kerby Moors M.D.   On: 05/19/2021 10:27    Scheduled Meds:  diphenhydrAMINE  25 mg Oral Q8H   famotidine  20 mg Oral Daily   fluticasone  2 spray Each Nare Daily   ketorolac  30 mg Intravenous Q6H   polyethylene glycol  17 g Oral Daily   sodium chloride flush  3 mL Intravenous Q12H   Continuous Infusions:  sodium chloride     piperacillin-tazobactam (ZOSYN)  IV 3.375 g (05/21/21 0413)   PRN Meds:sodium chloride, acetaminophen, alum & mag hydroxide-simeth, docusate sodium, hydrOXYzine, ibuprofen, melatonin, ondansetron **OR** ondansetron (ZOFRAN) IV, oxyCODONE, simethicone, sodium chloride flush    LOS: 2 days   [name], MD 05/21/2021 7:14 AM

## 2021-05-21 NOTE — Plan of Care (Signed)
  Problem: Activity: Goal: Risk for activity intolerance will decrease Outcome: Progressing   Problem: Nutrition: Goal: Adequate nutrition will be maintained Outcome: Progressing   Problem: Pain Managment: Goal: General experience of comfort will improve Outcome: Progressing   

## 2021-05-21 NOTE — Plan of Care (Signed)
°  Problem: Activity: Goal: Risk for activity intolerance will decrease Outcome: Progressing   Problem: Safety: Goal: Ability to remain free from injury will improve Outcome: Progressing   Problem: Nutrition: Goal: Adequate nutrition will be maintained Outcome: Not Progressing   Problem: Pain Managment: Goal: General experience of comfort will improve Outcome: Not Progressing

## 2021-05-22 LAB — COMPREHENSIVE METABOLIC PANEL
ALT: 11 U/L (ref 0–44)
AST: 12 U/L — ABNORMAL LOW (ref 15–41)
Albumin: 3.2 g/dL — ABNORMAL LOW (ref 3.5–5.0)
Alkaline Phosphatase: 51 U/L (ref 38–126)
Anion gap: 11 (ref 5–15)
BUN: 9 mg/dL (ref 6–20)
CO2: 24 mmol/L (ref 22–32)
Calcium: 9 mg/dL (ref 8.9–10.3)
Chloride: 104 mmol/L (ref 98–111)
Creatinine, Ser: 1.11 mg/dL — ABNORMAL HIGH (ref 0.44–1.00)
GFR, Estimated: >60 mL/min (ref >60)
Glucose, Bld: 97 mg/dL (ref 70–99)
Potassium: 4.4 mmol/L (ref 3.5–5.1)
Sodium: 139 mmol/L (ref 135–145)
Total Bilirubin: 0.2 mg/dL — ABNORMAL LOW (ref 0.3–1.2)
Total Protein: 6.2 g/dL — ABNORMAL LOW (ref 6.5–8.1)

## 2021-05-22 LAB — CBC
HCT: 34.4 % — ABNORMAL LOW (ref 36.0–46.0)
Hemoglobin: 10.9 g/dL — ABNORMAL LOW (ref 12.0–15.0)
MCH: 28.7 pg (ref 26.0–34.0)
MCHC: 31.7 g/dL (ref 30.0–36.0)
MCV: 90.5 fL (ref 80.0–100.0)
Platelets: 446 10*3/uL — ABNORMAL HIGH (ref 150–400)
RBC: 3.8 MIL/uL — ABNORMAL LOW (ref 3.87–5.11)
RDW: 12.6 % (ref 11.5–15.5)
WBC: 12.2 10*3/uL — ABNORMAL HIGH (ref 4.0–10.5)
nRBC: 0 % (ref 0.0–0.2)

## 2021-05-22 NOTE — Progress Notes (Signed)
Patient ID: Elaine [name], female   DOB: 07-Mar-1976, 46 y.o.   MRN: [id number]   Assessment/Plan: Principal Problem:   Endometritis  U/s reveals large submucosal fibroid, which may be aborting. She is having pain and bleeding and is passing tissue. We changed her Abx yesterday and her WBC is falling.  She remains afebrile. Continue IV x 1 more day, if doing well, will DC tomorrow.   Subjective: Interval History:Having some mild bleeding.  Objective: Vital signs in last 24 hours: Temp:  [97.9 F (36.6 C)-98.5 F (36.9 C)] 98.4 F (36.9 C) (02/22 0716) Pulse Rate:  [79-87] 79 (02/22 0716) Resp:  [16-20] 16 (02/22 0716) BP: (120-140)/(68-83) 132/83 (02/22 0716) SpO2:  [98 %-100 %] 98 % (02/22 0716)  Intake/Output from previous day: 02/21 0701 - 02/22 0700 In: 1270 [P.O.:1170] Out: -  Intake/Output this shift: No intake/output data recorded.  BP 132/83 (BP Location: Left Arm)    Pulse 79    Temp 98.4 F (36.9 C) (Oral)    Resp 16    Ht 5\' 11"  (1.803 m)    Wt 94.3 kg    SpO2 98%    BMI 29.01 kg/m  General appearance: alert, cooperative, and appears stated age Head: Normocephalic, without obvious abnormality, atraumatic Neck: supple, symmetrical, trachea midline Lungs:  normal effort Heart: regular rate and rhythm Abdomen: soft, non-tender; bowel sounds normal; no masses,  no organomegaly Extremities: extremities normal, atraumatic, no cyanosis or edema Skin: Skin color, texture, turgor normal. No rashes or lesions  Results for orders placed or performed during the hospital encounter of 05/19/21 (from the past 24 hour(s))  CBC     Status: Abnormal   Collection Time: 05/22/21  1:04 AM  Result Value Ref Range   WBC 12.2 (H) 4.0 - 10.5 K/uL   RBC 3.80 (L) 3.87 - 5.11 MIL/uL   Hemoglobin 10.9 (L) 12.0 - 15.0 g/dL   HCT 34.4 (L) 36.0 - 46.0 %   MCV 90.5 80.0 - 100.0 fL   MCH 28.7 26.0 - 34.0 pg   MCHC 31.7 30.0 - 36.0 g/dL   RDW 12.6 11.5 - 15.5 %   Platelets 446 (H) 150 -  400 K/uL   nRBC 0.0 0.0 - 0.2 %  Comprehensive metabolic panel     Status: Abnormal   Collection Time: 05/22/21  1:04 AM  Result Value Ref Range   Sodium 139 135 - 145 mmol/L   Potassium 4.4 3.5 - 5.1 mmol/L   Chloride 104 98 - 111 mmol/L   CO2 24 22 - 32 mmol/L   Glucose, Bld 97 70 - 99 mg/dL   BUN 9 6 - 20 mg/dL   Creatinine, Ser 1.11 (H) 0.44 - 1.00 mg/dL   Calcium 9.0 8.9 - 10.3 mg/dL   Total Protein 6.2 (L) 6.5 - 8.1 g/dL   Albumin 3.2 (L) 3.5 - 5.0 g/dL   AST 12 (L) 15 - 41 U/L   ALT 11 0 - 44 U/L   Alkaline Phosphatase 51 38 - 126 U/L   Total Bilirubin 0.2 (L) 0.3 - 1.2 mg/dL   GFR, Estimated >60 >60 mL/min   Anion gap 11 5 - 15    Studies/Results: DG Chest 2 View  Result Date: 05/08/2021 CLINICAL DATA:  Alpha 1 antitrypsin deficiency. EXAM: CHEST - 2 VIEW COMPARISON:  Chest radiograph 10/08/2020. FINDINGS: Stable cardiac and mediastinal contours. Pectus excavatum deformity. No large area of pulmonary consolidation. No pleural effusion or pneumothorax. Osseous structures unremarkable. IMPRESSION: No acute  cardiopulmonary process. Electronically Signed   By: Lovey Newcomer M.D.   On: 05/08/2021 00:13   CT Abdomen Pelvis W Contrast  Result Date: 05/19/2021 CLINICAL DATA:  Four week status post dilatation and curettage. Now with abnormal, foul-smelling uterine bleeding, leukocytosis, and low-grade fever. EXAM: CT ABDOMEN AND PELVIS WITH CONTRAST TECHNIQUE: Multidetector CT imaging of the abdomen and pelvis was performed using the standard protocol following bolus administration of intravenous contrast. RADIATION DOSE REDUCTION: This exam was performed according to the departmental dose-optimization program which includes automated exposure control, adjustment of the mA and/or kV according to patient size and/or use of iterative reconstruction technique. CONTRAST:  62mL OMNIPAQUE IOHEXOL 300 MG/ML  SOLN COMPARISON:  None. FINDINGS: Lower chest: Subsegmental atelectasis or scar noted  within the inferior lingula. Hepatobiliary: No focal liver abnormality is seen. No gallstones, gallbladder wall thickening, or biliary dilatation. Pancreas: Unremarkable. No pancreatic ductal dilatation or surrounding inflammatory changes. Spleen: Normal in size without focal abnormality. Adrenals/Urinary Tract: Normal adrenal glands. No kidney mass, nephrolithiasis or hydronephrosis identified bilaterally. The bladder is unremarkable. Stomach/Bowel: Stomach is within normal limits. Appendix appears normal. No evidence of bowel wall thickening, distention, or inflammatory changes. Vascular/Lymphatic: No significant vascular findings are present. No enlarged abdominal or pelvic lymph nodes. Reproductive: The uterus is enlarged and there is diffuse heterogeneous thickening of the endometrial cavity which measures 5.1 by 8.2 by 5.9 cm, image 55/7. Other: There is complex fluid noted within the right and posterior pelvis, image 78/3. Musculoskeletal: No acute or significant osseous findings. IMPRESSION: 1. The uterus it is enlarged with diffuse heterogeneous distension of the endometrial cavity. In the setting of recent endometrial instrumentation imaging findings are concerning for endometritis. Underlying retained products of conception cannot be excluded. 2. Complex fluid noted within the right and posterior pelvis. 3. No extra uterine fluid collections identified. Electronically Signed   By: Kerby Moors M.D.   On: 05/19/2021 10:27   US PELVIC COMPLETE WITH TRANSVAGINAL  Result Date: 05/21/2021 CLINICAL DATA:  Endometritis EXAM: TRANSABDOMINAL AND TRANSVAGINAL ULTRASOUND OF PELVIS TECHNIQUE: Both transabdominal and transvaginal ultrasound examinations of the pelvis were performed. Transabdominal technique was performed for global imaging of the pelvis including uterus, ovaries, adnexal regions, and pelvic cul-de-sac. It was necessary to proceed with endovaginal exam following the transabdominal exam to  visualize the uterus, endometrium, and ovaries. COMPARISON:  01/14/2021 FINDINGS: Uterus Measurements: 11.2 x 8.8 x 8.3 cm = volume: 428 mL. Anteverted. Large central leiomyoma 3.6 x 3.9 x 4.0 cm, likely submucosal. No additional masses. Endometrium Obscured by leiomyoma at central leiomyoma Right ovary Measurements: 1.9 x 1.6 x 1.1 cm = volume: 1.7 mL. Small corpus luteum; no follow-up imaging recommended. Left ovary Measurements: 2.9 x 1.1 x 1.4 cm = volume: 2.4 mL. Normal morphology without mass Other findings No abnormal free fluid. IMPRESSION: 3.9 cm diameter central uterine leiomyoma, likely submucosal. Obscuration of endometrial complex by leiomyoma. Unremarkable ovaries and adnexa. Electronically Signed   By: Lavonia Dana M.D.   On: 05/21/2021 13:44    Scheduled Meds:  diphenhydrAMINE  25 mg Oral Q8H   famotidine  20 mg Oral Daily   fluticasone  2 spray Each Nare Daily   ketorolac  30 mg Intravenous Q6H   metroNIDAZOLE  500 mg Oral Q12H   norethindrone  10 mg Oral Daily   polyethylene glycol  17 g Oral Daily   sodium chloride flush  3 mL Intravenous Q12H   Continuous Infusions:  sodium chloride     cefTRIAXone (ROCEPHIN)  IV 2 g (05/21/21 1355)   PRN Meds:sodium chloride, acetaminophen, alum & mag hydroxide-simeth, docusate sodium, hydrOXYzine, ibuprofen, melatonin, ondansetron **OR** ondansetron (ZOFRAN) IV, oxyCODONE, simethicone, sodium chloride flush    LOS: 3 days   [name], MD 05/22/2021 7:59 AM

## 2021-05-23 LAB — COMPREHENSIVE METABOLIC PANEL
ALT: 14 U/L (ref 0–44)
AST: 12 U/L — ABNORMAL LOW (ref 15–41)
Albumin: 3.4 g/dL — ABNORMAL LOW (ref 3.5–5.0)
Alkaline Phosphatase: 57 U/L (ref 38–126)
Anion gap: 10 (ref 5–15)
BUN: 7 mg/dL (ref 6–20)
CO2: 19 mmol/L — ABNORMAL LOW (ref 22–32)
Calcium: 9.1 mg/dL (ref 8.9–10.3)
Chloride: 108 mmol/L (ref 98–111)
Creatinine, Ser: 0.81 mg/dL (ref 0.44–1.00)
GFR, Estimated: >60 mL/min (ref >60)
Glucose, Bld: 98 mg/dL (ref 70–99)
Potassium: 4.3 mmol/L (ref 3.5–5.1)
Sodium: 137 mmol/L (ref 135–145)
Total Bilirubin: 0.4 mg/dL (ref 0.3–1.2)
Total Protein: 6.8 g/dL (ref 6.5–8.1)

## 2021-05-23 LAB — CBC
HCT: 35.6 % — ABNORMAL LOW (ref 36.0–46.0)
Hemoglobin: 11.9 g/dL — ABNORMAL LOW (ref 12.0–15.0)
MCH: 29.5 pg (ref 26.0–34.0)
MCHC: 33.4 g/dL (ref 30.0–36.0)
MCV: 88.1 fL (ref 80.0–100.0)
Platelets: 484 10*3/uL — ABNORMAL HIGH (ref 150–400)
RBC: 4.04 MIL/uL (ref 3.87–5.11)
RDW: 12.7 % (ref 11.5–15.5)
WBC: 17.8 10*3/uL — ABNORMAL HIGH (ref 4.0–10.5)
nRBC: 0 % (ref 0.0–0.2)

## 2021-05-23 NOTE — Progress Notes (Signed)
Patient ID: INDA MCGLOTHEN, female   DOB: 1975-11-17, 46 y.o.   MRN: [id number]   Assessment/Plan: Principal Problem:   Endometritis   Diarrhea - s/p miralax yesterday--will hopefully resolve--WBC increased again today  Still passing tissue Afebrile. Will discuss with pharmacy if we need to consider a change in Abx. Hgb is stable.   Subjective: Interval History:Had been constipated. Received Miralax yesterday and now has been going q 30-40 mins for many hours.  Objective: Vital signs in last 24 hours: Temp:  [97.8 F (36.6 C)-98.9 F (37.2 C)] 97.8 F (36.6 C) (02/23 0544) Pulse Rate:  [78-95] 80 (02/23 0544) Resp:  [16-19] 19 (02/23 0544) BP: (132-158)/(85-95) 138/95 (02/23 0544) SpO2:  [97 %-99 %] 97 % (02/23 0544)  Intake/Output from previous day: 02/22 0701 - 02/23 0700 In: 480 [P.O.:480] Out: -  Intake/Output this shift: No intake/output data recorded.  BP (!) 138/95 (BP Location: Left Arm)    Pulse 80    Temp 97.8 F (36.6 C) (Oral)    Resp 19    Ht 5\' 11"  (1.803 m)    Wt 94.3 kg    SpO2 97%    BMI 29.01 kg/m  General appearance: alert, cooperative, and appears stated age Head: Normocephalic, without obvious abnormality, atraumatic Neck: supple, symmetrical, trachea midline Lungs:  normal effort Heart: regular rate and rhythm Abdomen: soft, non-tender; bowel sounds normal; no masses,  no organomegaly Extremities: Homans sign is negative, no sign of DVT Skin: Skin color, texture, turgor normal. No rashes or lesions Neurologic: Grossly normal  Results for orders placed or performed during the hospital encounter of 05/19/21 (from the past 24 hour(s))  CBC     Status: Abnormal   Collection Time: 05/23/21  4:11 AM  Result Value Ref Range   WBC 17.8 (H) 4.0 - 10.5 K/uL   RBC 4.04 3.87 - 5.11 MIL/uL   Hemoglobin 11.9 (L) 12.0 - 15.0 g/dL   HCT 35.6 (L) 36.0 - 46.0 %   MCV 88.1 80.0 - 100.0 fL   MCH 29.5 26.0 - 34.0 pg   MCHC 33.4 30.0 - 36.0 g/dL   RDW 12.7 11.5  - 15.5 %   Platelets 484 (H) 150 - 400 K/uL   nRBC 0.0 0.0 - 0.2 %  Comprehensive metabolic panel     Status: Abnormal   Collection Time: 05/23/21  4:11 AM  Result Value Ref Range   Sodium 137 135 - 145 mmol/L   Potassium 4.3 3.5 - 5.1 mmol/L   Chloride 108 98 - 111 mmol/L   CO2 19 (L) 22 - 32 mmol/L   Glucose, Bld 98 70 - 99 mg/dL   BUN 7 6 - 20 mg/dL   Creatinine, Ser 0.81 0.44 - 1.00 mg/dL   Calcium 9.1 8.9 - 10.3 mg/dL   Total Protein 6.8 6.5 - 8.1 g/dL   Albumin 3.4 (L) 3.5 - 5.0 g/dL   AST 12 (L) 15 - 41 U/L   ALT 14 0 - 44 U/L   Alkaline Phosphatase 57 38 - 126 U/L   Total Bilirubin 0.4 0.3 - 1.2 mg/dL   GFR, Estimated >60 >60 mL/min   Anion gap 10 5 - 15    Studies/Results: DG Chest 2 View  Result Date: 05/08/2021 CLINICAL DATA:  Alpha 1 antitrypsin deficiency. EXAM: CHEST - 2 VIEW COMPARISON:  Chest radiograph 10/08/2020. FINDINGS: Stable cardiac and mediastinal contours. Pectus excavatum deformity. No large area of pulmonary consolidation. No pleural effusion or pneumothorax. Osseous structures unremarkable. IMPRESSION:  No acute cardiopulmonary process. Electronically Signed   By: Lovey Newcomer M.D.   On: 05/08/2021 00:13   CT Abdomen Pelvis W Contrast  Result Date: 05/19/2021 CLINICAL DATA:  Four week status post dilatation and curettage. Now with abnormal, foul-smelling uterine bleeding, leukocytosis, and low-grade fever. EXAM: CT ABDOMEN AND PELVIS WITH CONTRAST TECHNIQUE: Multidetector CT imaging of the abdomen and pelvis was performed using the standard protocol following bolus administration of intravenous contrast. RADIATION DOSE REDUCTION: This exam was performed according to the departmental dose-optimization program which includes automated exposure control, adjustment of the mA and/or kV according to patient size and/or use of iterative reconstruction technique. CONTRAST:  49mL OMNIPAQUE IOHEXOL 300 MG/ML  SOLN COMPARISON:  None. FINDINGS: Lower chest: Subsegmental  atelectasis or scar noted within the inferior lingula. Hepatobiliary: No focal liver abnormality is seen. No gallstones, gallbladder wall thickening, or biliary dilatation. Pancreas: Unremarkable. No pancreatic ductal dilatation or surrounding inflammatory changes. Spleen: Normal in size without focal abnormality. Adrenals/Urinary Tract: Normal adrenal glands. No kidney mass, nephrolithiasis or hydronephrosis identified bilaterally. The bladder is unremarkable. Stomach/Bowel: Stomach is within normal limits. Appendix appears normal. No evidence of bowel wall thickening, distention, or inflammatory changes. Vascular/Lymphatic: No significant vascular findings are present. No enlarged abdominal or pelvic lymph nodes. Reproductive: The uterus is enlarged and there is diffuse heterogeneous thickening of the endometrial cavity which measures 5.1 by 8.2 by 5.9 cm, image 55/7. Other: There is complex fluid noted within the right and posterior pelvis, image 78/3. Musculoskeletal: No acute or significant osseous findings. IMPRESSION: 1. The uterus it is enlarged with diffuse heterogeneous distension of the endometrial cavity. In the setting of recent endometrial instrumentation imaging findings are concerning for endometritis. Underlying retained products of conception cannot be excluded. 2. Complex fluid noted within the right and posterior pelvis. 3. No extra uterine fluid collections identified. Electronically Signed   By: Kerby Moors M.D.   On: 05/19/2021 10:27   US PELVIC COMPLETE WITH TRANSVAGINAL  Result Date: 05/21/2021 CLINICAL DATA:  Endometritis EXAM: TRANSABDOMINAL AND TRANSVAGINAL ULTRASOUND OF PELVIS TECHNIQUE: Both transabdominal and transvaginal ultrasound examinations of the pelvis were performed. Transabdominal technique was performed for global imaging of the pelvis including uterus, ovaries, adnexal regions, and pelvic cul-de-sac. It was necessary to proceed with endovaginal exam following the  transabdominal exam to visualize the uterus, endometrium, and ovaries. COMPARISON:  01/14/2021 FINDINGS: Uterus Measurements: 11.2 x 8.8 x 8.3 cm = volume: 428 mL. Anteverted. Large central leiomyoma 3.6 x 3.9 x 4.0 cm, likely submucosal. No additional masses. Endometrium Obscured by leiomyoma at central leiomyoma Right ovary Measurements: 1.9 x 1.6 x 1.1 cm = volume: 1.7 mL. Small corpus luteum; no follow-up imaging recommended. Left ovary Measurements: 2.9 x 1.1 x 1.4 cm = volume: 2.4 mL. Normal morphology without mass Other findings No abnormal free fluid. IMPRESSION: 3.9 cm diameter central uterine leiomyoma, likely submucosal. Obscuration of endometrial complex by leiomyoma. Unremarkable ovaries and adnexa. Electronically Signed   By: Lavonia Dana M.D.   On: 05/21/2021 13:44    Scheduled Meds:  diphenhydrAMINE  25 mg Oral Q8H   famotidine  20 mg Oral Daily   fluticasone  2 spray Each Nare Daily   metroNIDAZOLE  500 mg Oral Q12H   norethindrone  10 mg Oral Daily   polyethylene glycol  17 g Oral Daily   sodium chloride flush  3 mL Intravenous Q12H   Continuous Infusions:  sodium chloride     cefTRIAXone (ROCEPHIN)  IV 2 g (05/22/21 1125)  PRN Meds:sodium chloride, acetaminophen, alum & mag hydroxide-simeth, docusate sodium, hydrOXYzine, ibuprofen, melatonin, ondansetron **OR** ondansetron (ZOFRAN) IV, oxyCODONE, simethicone, sodium chloride flush    LOS: 4 days   [name], MD 05/23/2021 7:49 AM

## 2021-05-24 ENCOUNTER — Ambulatory Visit: Payer: 59

## 2021-05-24 LAB — CBC
HCT: 36.5 % (ref 36.0–46.0)
Hemoglobin: 12.2 g/dL (ref 12.0–15.0)
MCH: 29.5 pg (ref 26.0–34.0)
MCHC: 33.4 g/dL (ref 30.0–36.0)
MCV: 88.2 fL (ref 80.0–100.0)
Platelets: 467 10*3/uL — ABNORMAL HIGH (ref 150–400)
RBC: 4.14 MIL/uL (ref 3.87–5.11)
RDW: 12.8 % (ref 11.5–15.5)
WBC: 16 10*3/uL — ABNORMAL HIGH (ref 4.0–10.5)
nRBC: 0 % (ref 0.0–0.2)

## 2021-05-24 MED ORDER — CEFADROXIL 500 MG PO CAPS: 500.0000 mg | ORAL_CAPSULE | Freq: Two times a day (BID) | ORAL | 0 refills | Status: DC

## 2021-05-24 MED ORDER — METRONIDAZOLE 500 MG PO TABS: 500.0000 mg | ORAL_TABLET | Freq: Two times a day (BID) | ORAL | 0 refills | Status: DC

## 2021-05-24 NOTE — Progress Notes (Signed)
Nsg Discharge Note  Admit Date:  05/19/2021 Discharge date: 05/24/2021   WILLADENE MOUNSEY to be D/C'd Home per MD order.  AVS completed. Patient/caregiver able to verbalize understanding.  Discharge Medication: Allergies as of 05/24/2021       Reactions   Other Nausea And Vomiting   Opioids   Cephalexin Other (See Comments)   Blisters in mouth    Dilaudid  [hydromorphone Hcl] Nausea And Vomiting   Penicillins Rash   Hasn't had since childhood Itching with Zosyn 05/2021        Medication List     STOP taking these medications    nitrofurantoin (macrocrystal-monohydrate) 100 MG capsule Commonly known as: MACROBID   oxyCODONE-acetaminophen 5-325 MG tablet Commonly known as: PERCOCET/ROXICET       TAKE these medications    cefadroxil 500 MG capsule Commonly known as: DURICEF Take 1 capsule (500 mg total) by mouth 2 (two) times daily for 7 days.   fluticasone 50 MCG/ACT nasal spray Commonly known as: FLONASE Place 2 sprays into both nostrils daily.   MAGNESIUM PO Take 1 capsule by mouth daily.   melatonin 5 MG Tabs Take 5 mg by mouth at bedtime as needed (Sleep).   meloxicam 15 MG tablet Commonly known as: MOBIC Take 15 mg by mouth daily.   metroNIDAZOLE 500 MG tablet Commonly known as: FLAGYL Take 1 tablet (500 mg total) by mouth 2 (two) times daily for 7 days.   mometasone 0.1 % cream Commonly known as: Elocon Apply 1 application topically daily.   norethindrone 5 MG tablet Commonly known as: AYGESTIN TAKE 2 TABLETS BY MOUTH EVERY DAY   rosuvastatin 5 MG tablet Commonly known as: CRESTOR Take 1 tablet (5 mg total) by mouth daily.   VITAMIN D3 PO Take 1 capsule by mouth daily.   zolmitriptan 5 MG tablet Commonly known as: Zomig Take 1 tablet by mouth at migraine onset, may repeat 2 hours later if migraine persists. Do not exceed 2 tablets in 24 hours.        Discharge Assessment: Vitals:   05/24/21 0327 05/24/21 0857  BP: 129/80 (!) 124/91   Pulse: 80 94  Resp: 19 19  Temp: 98.4 F (36.9 C) 98.5 F (36.9 C)  SpO2: 97% 99%   Skin clean, dry and intact without evidence of skin break down, no evidence of skin tears noted. IV catheter discontinued intact. Site without signs and symptoms of complications - no redness or edema noted at insertion site, patient denies c/o pain - only slight tenderness at site.  Dressing with slight pressure applied.  D/c Instructions-Education: Discharge instructions given to patient/family with verbalized understanding. D/c education completed with patient/family including follow up instructions, medication list, d/c activities limitations if indicated, with other d/c instructions as indicated by MD - patient able to verbalize understanding, all questions fully answered. Patient instructed to return to ED, call 911, or call MD for any changes in condition.  Patient escorted via [place], and D/C home via private auto.  Atilano Ina, RN 05/24/2021 9:29 AM

## 2021-05-24 NOTE — Discharge Summary (Signed)
Physician Discharge Summary  Patient ID: Elaine [name] MRN: [id number] DOB/AGE: [birth year] 45 y.o.  Admit date: 05/19/2021 Discharge date: 05/24/2021   Discharge Diagnoses:  Principal Problem:   Endometritis   Consults: None  Significant Diagnostic Studies:  Results for orders placed or performed during the hospital encounter of 05/19/21 (from the past 24 hour(s))  CBC     Status: Abnormal   Collection Time: 05/24/21  2:03 AM  Result Value Ref Range   WBC 16.0 (H) 4.0 - 10.5 K/uL   RBC 4.14 3.87 - 5.11 MIL/uL   Hemoglobin 12.2 12.0 - 15.0 g/dL   HCT 36.5 36.0 - 46.0 %   MCV 88.2 80.0 - 100.0 fL   MCH 29.5 26.0 - 34.0 pg   MCHC 33.4 30.0 - 36.0 g/dL   RDW 12.8 11.5 - 15.5 %   Platelets 467 (H) 150 - 400 K/uL   nRBC 0.0 0.0 - 0.2 %    DG Chest 2 View  Result Date: 05/08/2021 CLINICAL DATA:  Alpha 1 antitrypsin deficiency. EXAM: CHEST - 2 VIEW COMPARISON:  Chest radiograph 10/08/2020. FINDINGS: Stable cardiac and mediastinal contours. Pectus excavatum deformity. No large area of pulmonary consolidation. No pleural effusion or pneumothorax. Osseous structures unremarkable. IMPRESSION: No acute cardiopulmonary process. Electronically Signed   By: Lovey Newcomer M.D.   On: 05/08/2021 00:13   CT Abdomen Pelvis W Contrast  Result Date: 05/19/2021 CLINICAL DATA:  Four week status post dilatation and curettage. Now with abnormal, foul-smelling uterine bleeding, leukocytosis, and low-grade fever. EXAM: CT ABDOMEN AND PELVIS WITH CONTRAST TECHNIQUE: Multidetector CT imaging of the abdomen and pelvis was performed using the standard protocol following bolus administration of intravenous contrast. RADIATION DOSE REDUCTION: This exam was performed according to the departmental dose-optimization program which includes automated exposure control, adjustment of the mA and/or kV according to patient size and/or use of iterative reconstruction technique. CONTRAST:  7mL OMNIPAQUE IOHEXOL 300 MG/ML  SOLN  COMPARISON:  None. FINDINGS: Lower chest: Subsegmental atelectasis or scar noted within the inferior lingula. Hepatobiliary: No focal liver abnormality is seen. No gallstones, gallbladder wall thickening, or biliary dilatation. Pancreas: Unremarkable. No pancreatic ductal dilatation or surrounding inflammatory changes. Spleen: Normal in size without focal abnormality. Adrenals/Urinary Tract: Normal adrenal glands. No kidney mass, nephrolithiasis or hydronephrosis identified bilaterally. The bladder is unremarkable. Stomach/Bowel: Stomach is within normal limits. Appendix appears normal. No evidence of bowel wall thickening, distention, or inflammatory changes. Vascular/Lymphatic: No significant vascular findings are present. No enlarged abdominal or pelvic lymph nodes. Reproductive: The uterus is enlarged and there is diffuse heterogeneous thickening of the endometrial cavity which measures 5.1 by 8.2 by 5.9 cm, image 55/7. Other: There is complex fluid noted within the right and posterior pelvis, image 78/3. Musculoskeletal: No acute or significant osseous findings. IMPRESSION: 1. The uterus it is enlarged with diffuse heterogeneous distension of the endometrial cavity. In the setting of recent endometrial instrumentation imaging findings are concerning for endometritis. Underlying retained products of conception cannot be excluded. 2. Complex fluid noted within the right and posterior pelvis. 3. No extra uterine fluid collections identified. Electronically Signed   By: Kerby Moors M.D.   On: 05/19/2021 10:27   US PELVIC COMPLETE WITH TRANSVAGINAL  Result Date: 05/21/2021 CLINICAL DATA:  Endometritis EXAM: TRANSABDOMINAL AND TRANSVAGINAL ULTRASOUND OF PELVIS TECHNIQUE: Both transabdominal and transvaginal ultrasound examinations of the pelvis were performed. Transabdominal technique was performed for global imaging of the pelvis including uterus, ovaries, adnexal regions, and pelvic cul-de-sac. It was  necessary  to proceed with endovaginal exam following the transabdominal exam to visualize the uterus, endometrium, and ovaries. COMPARISON:  01/14/2021 FINDINGS: Uterus Measurements: 11.2 x 8.8 x 8.3 cm = volume: 428 mL. Anteverted. Large central leiomyoma 3.6 x 3.9 x 4.0 cm, likely submucosal. No additional masses. Endometrium Obscured by leiomyoma at central leiomyoma Right ovary Measurements: 1.9 x 1.6 x 1.1 cm = volume: 1.7 mL. Small corpus luteum; no follow-up imaging recommended. Left ovary Measurements: 2.9 x 1.1 x 1.4 cm = volume: 2.4 mL. Normal morphology without mass Other findings No abnormal free fluid. IMPRESSION: 3.9 cm diameter central uterine leiomyoma, likely submucosal. Obscuration of endometrial complex by leiomyoma. Unremarkable ovaries and adnexa. Electronically Signed   By: Lavonia Dana M.D.   On: 05/21/2021 13:44     Hospital Course: Patient admitted with presumed endometritis given elevated WBC and pain and tachycardia. She was started on broad spectrum Abx. Imaging suggested submucosal fibroid that might be aborting. Her Abx were changed due to itching with Zosyn to Rocephin and Flagyl. She remained afebrile. Pain improved. WBC fluctuated and abruptly jumped to 17K when she developed some diarrhea following laxative administration due to constipation, but was coming down on the day of discharge.     Treatments: IV hydration and antibiotics: Zosyn, ceftriaxone, and metronidazole   Disposition: Discharge disposition: 01-Home or Self Care     Discharged Condition: good  Discharge Instructions     Call MD for:  persistant nausea and vomiting   Complete by: As directed    Call MD for:  severe uncontrolled pain   Complete by: As directed    Call MD for:  temperature >100.4   Complete by: As directed    Diet - low sodium heart healthy   Complete by: As directed    Increase activity slowly   Complete by: As directed       Allergies as of 05/24/2021       Reactions    Other Nausea And Vomiting   Opioids   Cephalexin Other (See Comments)   Blisters in mouth    Dilaudid  [hydromorphone Hcl] Nausea And Vomiting   Penicillins Rash   Hasn't had since childhood Itching with Zosyn 05/2021        Medication List     STOP taking these medications    nitrofurantoin (macrocrystal-monohydrate) 100 MG capsule Commonly known as: MACROBID   oxyCODONE-acetaminophen 5-325 MG tablet Commonly known as: PERCOCET/ROXICET       TAKE these medications    cefadroxil 500 MG capsule Commonly known as: DURICEF Take 1 capsule (500 mg total) by mouth 2 (two) times daily for 7 days.   fluticasone 50 MCG/ACT nasal spray Commonly known as: FLONASE Place 2 sprays into both nostrils daily.   MAGNESIUM PO Take 1 capsule by mouth daily.   melatonin 5 MG Tabs Take 5 mg by mouth at bedtime as needed (Sleep).   meloxicam 15 MG tablet Commonly known as: MOBIC Take 15 mg by mouth daily.   metroNIDAZOLE 500 MG tablet Commonly known as: FLAGYL Take 1 tablet (500 mg total) by mouth 2 (two) times daily for 7 days.   mometasone 0.1 % cream Commonly known as: Elocon Apply 1 application topically daily.   norethindrone 5 MG tablet Commonly known as: AYGESTIN TAKE 2 TABLETS BY MOUTH EVERY DAY   rosuvastatin 5 MG tablet Commonly known as: CRESTOR Take 1 tablet (5 mg total) by mouth daily.   VITAMIN D3 PO Take 1 capsule by mouth daily.  zolmitriptan 5 MG tablet Commonly known as: Zomig Take 1 tablet by mouth at migraine onset, may repeat 2 hours later if migraine persists. Do not exceed 2 tablets in 24 hours.        Follow-up [facility] Follow up in 1 week(s).   Specialty: Obstetrics and Gynecology Why: Hospital follow-up Contact information: [place] [id number] [phone]                Signed: [name] 05/24/2021, 8:48 AM

## 2021-05-25 NOTE — Progress Notes (Signed)
°  Progress Note   Date: 05/23/2021  Patient Name: Elaine [name]        MRN#: [id number]   Post-operative   is a complication of surgery

## 2021-05-30 ENCOUNTER — Encounter: Payer: Self-pay | Admitting: Family Medicine

## 2021-05-30 ENCOUNTER — Other Ambulatory Visit: Payer: Self-pay

## 2021-05-30 ENCOUNTER — Ambulatory Visit: Payer: 59 | Admitting: Family Medicine

## 2021-05-30 DIAGNOSIS — N719 Inflammatory disease of uterus, unspecified: Secondary | ICD-10-CM

## 2021-05-30 MED ORDER — METRONIDAZOLE 500 MG PO TABS: 500.0000 mg | ORAL_TABLET | Freq: Two times a day (BID) | ORAL | 0 refills | Status: AC

## 2021-05-30 MED ORDER — CEFADROXIL 500 MG PO CAPS: 500.0000 mg | ORAL_CAPSULE | Freq: Two times a day (BID) | ORAL | 0 refills | Status: AC

## 2021-05-30 NOTE — Assessment & Plan Note (Signed)
As patient did not have shedding of lining post surgery, suspect this is what is on-going, plus aborting a likely degenerating fibroid. Will continue Abx for total of 14 days. Usual timing of resolution of symptoms discussed. ?

## 2021-05-30 NOTE — Progress Notes (Signed)
? ?  Subjective:  ? ? Patient ID: Elaine [name] is a 46 y.o. female presenting with Gynecologic Exam ? on 05/30/2021 ? ?HPI: ?Here for f/u. She underwent HTA on 04/16/2021. Admitted on 2/19-2/24 for presumed endometritis with elevated WBC and abdominal pain. Following HTA, had no discharge or bleeding. She developed this during her hospitalization and may be aborting a fibroid that is degenerating. She reports had some heavier bleeding yesterday with passage of tissue and purulent drainage. ?She ? ?Review of Systems  ?Constitutional:  Negative for chills and fever.  ?Respiratory:  Negative for shortness of breath.   ?Cardiovascular:  Negative for chest pain.  ?Gastrointestinal:  Negative for abdominal pain, nausea and vomiting.  ?Genitourinary:  Negative for dysuria.  ?Skin:  Negative for rash.  ?   ?Objective:  ?  ?BP 130/79   Pulse 84   Wt 205 lb (93 kg)   BMI 28.59 kg/m?  ?Physical Exam ?Exam conducted with a chaperone present.  ?Constitutional:   ?   General: She is not in acute distress. ?   Appearance: She is well-developed.  ?HENT:  ?   Head: Normocephalic and atraumatic.  ?Eyes:  ?   General: No scleral icterus. ?Cardiovascular:  ?   Rate and Rhythm: Normal rate.  ?Pulmonary:  ?   Effort: Pulmonary effort is normal.  ?Abdominal:  ?   Palpations: Abdomen is soft.  ?Musculoskeletal:  ?   Cervical back: Neck supple.  ?Skin: ?   General: Skin is warm and dry.  ?Neurological:  ?   Mental Status: She is alert and oriented to person, place, and time.  ? ? ? ?   ?Assessment & Plan:  ? ?Problem List Items Addressed This Visit   ? ?  ? Unprioritized  ? Endometritis  ?  As patient did not have shedding of lining post surgery, suspect this is what is on-going, plus aborting a likely degenerating fibroid. Will continue Abx for total of 14 days. Usual timing of resolution of symptoms discussed. ?  ?  ? ? ?Return in about 4 weeks (around 06/27/2021) for a follow-up. ? ?[name], MD ?05/30/2021 ?9:46 AM ? ? ? ?

## 2021-05-30 NOTE — Progress Notes (Signed)
Patient is here to follow up from ED visit 02/19. She stated that she felt a "pop in cervix" and was having significant pain along with "bleeding, clots and puss" ? ?Elaine [name] stated that she is still currently having discharge but things have "slowed down" ?

## 2021-06-06 ENCOUNTER — Encounter: Payer: Self-pay | Admitting: Family Medicine

## 2021-06-07 MED ORDER — FLUCONAZOLE 150 MG PO TABS
150.0000 mg | ORAL_TABLET | Freq: Every day | ORAL | 2 refills | Status: DC
Start: 2021-06-07 — End: 2021-06-10

## 2021-06-09 ENCOUNTER — Encounter: Payer: Self-pay | Admitting: Family Medicine

## 2021-06-09 DIAGNOSIS — B370 Candidal stomatitis: Secondary | ICD-10-CM

## 2021-06-10 ENCOUNTER — Ambulatory Visit
Admission: RE | Admit: 2021-06-10 | Discharge: 2021-06-10 | Disposition: A | Discharge status: Home / Self Care | Payer: 59 | Source: Ambulatory Visit | Attending: Family Medicine | Admitting: Family Medicine

## 2021-06-10 DIAGNOSIS — Z1231 Encounter for screening mammogram for malignant neoplasm of breast: Secondary | ICD-10-CM

## 2021-06-10 MED ORDER — FLUCONAZOLE 150 MG PO TABS: 150.0000 mg | ORAL_TABLET | Freq: Every day | ORAL | 2 refills | Status: DC

## 2021-06-10 MED ORDER — CLOTRIMAZOLE 10 MG MT TROC: 10.0000 mg | Freq: Every day | OROMUCOSAL | 0 refills | Status: AC

## 2021-06-27 ENCOUNTER — Encounter: Payer: Self-pay | Admitting: Family Medicine

## 2021-06-27 ENCOUNTER — Ambulatory Visit (INDEPENDENT_AMBULATORY_CARE_PROVIDER_SITE_OTHER): Payer: 59 | Admitting: Family Medicine

## 2021-06-27 DIAGNOSIS — N719 Inflammatory disease of uterus, unspecified: Secondary | ICD-10-CM

## 2021-06-27 DIAGNOSIS — N939 Abnormal uterine and vaginal bleeding, unspecified: Secondary | ICD-10-CM

## 2021-06-27 NOTE — Assessment & Plan Note (Addendum)
Improve, an no further evidence of infection may resume normal activity. ?

## 2021-06-27 NOTE — Progress Notes (Signed)
? ?  Subjective:  ? ? Patient ID: Elaine [name] is a 46 y.o. female presenting with No chief complaint on file. ? on 06/27/2021 ? ?HPI: ?Doing well. Sh eis s//p HTA with probable uterine fibroid degeneration and passage with possible infection, requiring hospitalization and Abx. No longer passing any tissue. She has completed those-->caused thrush, this has resolved. She also notes she had a normal timely cycle and it was 3 days, light and tolerable. ? ?Review of Systems  ?Constitutional:  Negative for chills and fever.  ?Respiratory:  Negative for shortness of breath.   ?Cardiovascular:  Negative for chest pain.  ?Gastrointestinal:  Negative for abdominal pain, nausea and vomiting.  ?Genitourinary:  Negative for dysuria.  ?Skin:  Negative for rash.  ?   ?Objective:  ?  ?BP 127/83   Pulse (!) 108   Wt 208 lb (94.3 kg)   LMP 06/15/2021   BMI 29.01 kg/m?  ?Physical Exam ?Exam conducted with a chaperone present.  ?Constitutional:   ?   General: She is not in acute distress. ?   Appearance: She is well-developed.  ?HENT:  ?   Head: Normocephalic and atraumatic.  ?Eyes:  ?   General: No scleral icterus. ?Cardiovascular:  ?   Rate and Rhythm: Normal rate.  ?Pulmonary:  ?   Effort: Pulmonary effort is normal.  ?Abdominal:  ?   Palpations: Abdomen is soft.  ?Musculoskeletal:  ?   Cervical back: Neck supple.  ?Skin: ?   General: Skin is warm and dry.  ?Neurological:  ?   Mental Status: She is alert and oriented to person, place, and time.  ? ? ? ?   ?Assessment & Plan:  ? ?Problem List Items Addressed This Visit   ? ?  ? Unprioritized  ? Abnormal uterine bleeding  ?  Improved since passing her submucosal fibroid and having her HTA. ?  ?  ? Endometritis  ?  Improve, an no further evidence of infection may resume normal activity. ?  ?  ? ? ? ?Return if symptoms worsen or fail to improve. ? ?[name], MD ?06/27/2021 ?9:59 AM ? ? ?

## 2021-06-27 NOTE — Assessment & Plan Note (Signed)
Improved since passing her submucosal fibroid and having her HTA. ?

## 2021-07-06 ENCOUNTER — Other Ambulatory Visit: Payer: Self-pay | Admitting: Cardiovascular Disease

## 2021-07-10 ENCOUNTER — Telehealth: Payer: Self-pay

## 2021-07-10 NOTE — Telephone Encounter (Addendum)
Called pt. Pt states she did not have endometrial biopsy with [facility]. Reports having 2 ultrasounds 2 weeks apart in 2018. Pt inquires about how long she should take Aygestin prescription. Reviewed with Kennon Rounds, MD who recommends patient should taper off Aygestin now and see how bleeding is. Patient to take one tablet today, none tomorrow. Called pt a second time to review recommendations. ?

## 2021-07-10 NOTE — Telephone Encounter (Signed)
-----   Message from [name], MD sent at 07/10/2021  7:30 AM EDT ----- ?Please call patient and see if she had an endometrial biopsy with [facility], and if so, please request results. ?Best, ?TSP ?----- Message ----- ?From: Luvenia Redden, PA-C ?Sent: 05/23/2021  10:31 AM EDT ?To: [name], MD, Francia Greaves, # ? ?Thanks for the background on this case Jordan ? ?----- Message ----- ?From: George Hugh S ?Sent: 05/23/2021  10:29 AM EST ?To: [name], MD, Luvenia Redden, PA-C, # ? ?We have been arguing this one for months, they didn't require a prior auth, then turned around and denied it as experimental ?----- Message ----- ?From: [name], MD ?Sent: 05/23/2021  10:15 AM EST ?To: Luvenia Redden, PA-C, Francia Greaves ? ?Says her insurance denied her HTA as experimental--it was done in [place] we know this before we complete the surgery? ?T ? ? ? ? ?

## 2021-07-30 ENCOUNTER — Ambulatory Visit (INDEPENDENT_AMBULATORY_CARE_PROVIDER_SITE_OTHER): Payer: 59 | Admitting: Cardiovascular Disease

## 2021-07-30 ENCOUNTER — Encounter: Payer: Self-pay | Admitting: Cardiovascular Disease

## 2021-07-30 VITALS — BP 138/88 | HR 74 | Ht 71.0 in | Wt 206.0 lb

## 2021-07-30 DIAGNOSIS — I951 Orthostatic hypotension: Secondary | ICD-10-CM | POA: Diagnosis not present

## 2021-07-30 DIAGNOSIS — E782 Mixed hyperlipidemia: Secondary | ICD-10-CM | POA: Diagnosis not present

## 2021-07-30 LAB — BASIC METABOLIC PANEL
BUN/Creatinine Ratio: 14 (ref 9–23)
BUN: 11 mg/dL (ref 6–24)
CO2: 22 mmol/L (ref 20–29)
Calcium: 10.1 mg/dL (ref 8.7–10.2)
Chloride: 103 mmol/L (ref 96–106)
Creatinine, Ser: 0.79 mg/dL (ref 0.57–1.00)
Glucose: 90 mg/dL (ref 70–99)
Potassium: 4.6 mmol/L (ref 3.5–5.2)
Sodium: 139 mmol/L (ref 134–144)
eGFR: 95 mL/min/{1.73_m2} (ref >59)

## 2021-07-30 LAB — LIPID PANEL
Chol/HDL Ratio: 3.6 ratio (ref 0.0–4.4)
Cholesterol, Total: 159 mg/dL (ref 100–199)
HDL: 44 mg/dL (ref >39)
LDL Chol Calc (NIH): 95 mg/dL (ref 0–99)
Triglycerides: 107 mg/dL (ref 0–149)
VLDL Cholesterol Cal: 20 mg/dL (ref 5–40)

## 2021-07-30 LAB — ALT: ALT: 32 IU/L (ref 0–32)

## 2021-07-30 MED ORDER — ROSUVASTATIN CALCIUM 5 MG PO TABS: 5.0000 mg | ORAL_TABLET | Freq: Every day | ORAL | 3 refills | Status: DC

## 2021-07-30 NOTE — Patient Instructions (Signed)
?  Medication Instructions:  ?Your physician recommends that you continue on your current medications as directed. Please refer to the Current Medication list given to you today. ? ?*If you need a refill on your cardiac medications before your next appointment, please call your pharmacy* ? ? ?Lab Work: ?Lipids, ALT, BMET Today ?If you have labs (blood work) drawn today and your tests are completely normal, you will receive your results only by: ?MyChart Message (if you have MyChart) OR ?A paper copy in the mail ?If you have any lab test that is abnormal or we need to change your treatment, we will call you to review the results. ? ? ?Testing/Procedures: ?NONE ? ? ?Follow-Up: ?At [facility], you and your health needs are our priority.  As part of our continuing mission to provide you with exceptional heart care, we have created designated Provider Care Teams.  These Care Teams include your primary Cardiologist (physician) and Advanced Practice Providers (APPs -  Physician Assistants and Nurse Practitioners) who all work together to provide you with the care you need, when you need it. ? ?Your next appointment:   ?1 year(s) ? ?The format for your next appointment:   ?In Person ? ?Provider:   ?Mertie Moores, MD  or Robbie Lis, PA-C, Christen Bame, NP, or Richardson Dopp, PA-C ? ?  ? ?Important Information About Sugar ? ? ? ? ?  ?

## 2021-07-30 NOTE — Progress Notes (Signed)
?Cardiology Office Note:   ? ?Date:  07/30/2021  ? ?ID:  Elaine [name], DOB 10-05-1975, MRN [id number] ? ?PCP:  Elaine Koch, NP  ?Cardiologist:  Mertie Moores, MD  ?Electrophysiologist:  None  ? ?Referring MD: Elaine Koch, NP  ? ?Chief Complaint  ?Patient presents with  ? Hyperlipidemia  ?  ? ?Jan. 6, 2020  ? ?Elaine [name] is a 45 y.o. female with a strong family history of premature coronary artery disease.  We are asked to see her today for further evaluation by Elaine Koch, NP ? ? ?She is seen with her husband, Elaine [name] ? ?Grandfathers and father had significant CAD - ( smoked heavily)  ? ?She also has episodes of tachycardia  ?She had an episode of sepsis related to diverticulitis.   ?She just started exercising. 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  ? ?They farm,   ( chickens , ducks, turkeys, gardens ) ?Has gained 20 lbs this past year -    ? ?Started exercising this past Nov.    ?Walks, has alpha 1 antitripsin deficiency - is not on inhalers  ?Sees pulmonary  ? ?Non smoker  ? ?Has leg swelling in the evening  ? ? ?Jan. 7, 2021 ?Elaine [name] is seen for follow up of her hyperlipidemia .  She has a strong family history of coronary artery disease.  She has alpha-1 antitrypsin disorder and has not been on a statin medication. ?Her coronary calcium score is no (Jan. 6, 2020)  ? ?Has bilateral shoulder pain she relates to starting Zetia . ?Has been taking black seed oil with some relief.   Also had some diarrhea - has resolved.  . ? ?Walks 30-45 minutes  A day .   ? ?Jan. 27, 2022: ?Elaine [name] is seen today for follow up of her HLD. 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 ?Has gotten back into pottery.  ?Is on Rosuvastatin   ?Has changed her diet,  Avoids gluten now  ? ?Encouraged her to  exercise more .  ? ?Nov. 2, 2022 ?Elaine [name] is seen for follow up of her  HLD, alpha 1 antitrypsin disorder  ?Doing well ?[place],  [place]  ? ?She had a syncopal episode at 3 am . ?Woke up to use the bathroom,  took perhaps 5 steps, then remembers waking up in the floor  ?Her apple watch shows a HR dropped into the 40  ? ?Has orthostatic hypotension  ?Drinks lots of water, also does 1 Nuun tablet a day  ? ?Jul 30, 2021 ?Elaine [name] is seen for follow up ofher HLD, presyncope  ?Placed an event monitor.  She had 1 benign self-limiting episode of nonsustained ventricular tachycardia.  I did not think that this was responsible her episode of presyncope. ?She has normal left ventricular systolic function.  She has baseline sinus bradycardia so not sure that she would tolerate a beta-blocker. ? ?She learned that she had a stress fracture of her R hip from the episode of syncope ? ?Had a uterine ablation, complicated by infection and bleeding  ?Can walk for 30 min before she has hip pain  ? ?Is seeing a decrease in her lung capacity  ?Has a pectus  excavatum and scoliosis  ? ?Has seen Dr. Charlton Amor for ortho  ?Thinks she might have ehlers danlos ? ?She is on Crestor for HLD.   Coronary calcium score from 2020 was 0 ? ? ? ? ?Past Medical History:  ?Diagnosis Date  ? ADHD (attention deficit hyperactivity disorder)   ? Alpha-1-antitrypsin deficiency ([place])   ? Arthritis   ? Asthma   ? Bulging lumbar disc   ? Diverticulitis   ? with sepsis  ? Diverticulosis   ? Family history of adverse reaction to anesthesia   ? 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 Fibroids   ? Vaginal  ? GERD (gastroesophageal reflux disease)   ? 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 No compelte work up, but suspected  ? Headache   ? Heart murmur   ? " Muscial " murmer -  ? History of hiatal hernia   ? Hyperlipidemia   ? Malignant hyperthermia   ? Orthostatic hypotension   ? Psoriasis   ? Ruptured ovarian cyst 2004,2016  ? Seizures ([place])   ? none since age 47   ? ? ?Past Surgical History:  ?Procedure Laterality Date  ? DENTAL SURGERY    ? DILITATION & CURRETTAGE/HYSTROSCOPY WITH HYDROTHERMAL ABLATION N/A 04/16/2021  ? Procedure: DILATATION & CURETTAGE/HYSTEROSCOPY WITH HYDROTHERMAL ABLATION;  Surgeon: [name], MD;  Location: [facility];  Service: Gynecology;  Laterality: N/A;  rep will be here confirmed on 04/08/21 CS  ? LAPAROSCOPIC SIGMOID COLECTOMY  2013  ? ? ?Current Medications: ?Current Meds  ?Medication Sig  ? betamethasone dipropionate 0.05 % lotion 1 liberally Topical Daily  ? Cholecalciferol (VITAMIN D3 PO) Take 1 capsule by mouth daily.  ? fluticasone (FLONASE) 50 MCG/ACT nasal spray Place 2 sprays into both nostrils daily.  ? MAGNESIUM PO Take 1 capsule by mouth daily.  ? Melatonin 5 MG TABS Take 5 mg by mouth at bedtime as needed (Sleep).  ? norethindrone (AYGESTIN) 5 MG tablet Take 5 mg by mouth daily.  ? zolmitriptan (ZOMIG) 5 MG tablet Take 1 tablet by mouth at migraine onset, may repeat 2 hours later if migraine persists. Do not exceed 2 tablets in 24 hours.  ? [DISCONTINUED] rosuvastatin (CRESTOR) 5 MG tablet TAKE 1 TABLET (5 MG TOTAL) BY MOUTH DAILY.  ?  ? ?Allergies:   Other, Cephalexin, Dilaudid  [hydromorphone hcl], and Penicillins  ? ?Social History  ? ?Socioeconomic History  ? Marital status: Married  ?  Spouse name: Not on file  ? Number of children: Not on file  ? Years of education: Not on file  ? Highest education level: Not on file  ?Occupational History  ? Not on file  ?Tobacco Use  ? Smoking status: Former  ?  Packs/day: 0.00  ?  Years: 5.00  ?  Pack years: 0.00  ?  Types: Cigarettes  ? Smokeless tobacco: Never  ?Vaping Use  ? Vaping Use: Never used  ?Substance and Sexual Activity  ? Alcohol use: Yes  ?  Comment: ocassional- 1-2 drinks  ? Drug use: Never  ? Sexual activity: Yes  ?Other Topics Concern  ? Not on file  ?Social History Narrative  ? Married.  ? 1 child.  ? Works as a Psychologist, sport and exercise.   ? ?Social Determinants of Health  ? ?Financial  Resource Strain: Not on file  ?Food Insecurity: No Food Insecurity  ? Worried About Charity fundraiser in the Last Year: Never true  ? Ran Out of Food in the Last Year: Never  true  ?Transportation Needs: No Transportation Needs  ? Lack of Transportation (Medical): No  ? Lack of Transportation (Non-Medical): No  ?Physical Activity: Not on file  ?Stress: Not on file  ?Social Connections: Not on file  ?  ? ?Family History: ?The patient's family history includes Heart attack (age of onset: 80) in her father; Hyperlipidemia in her father; Prostate cancer in her father; Stroke (age of onset: 20) in her father. There is no history of Stomach cancer, Colon polyps, Esophageal cancer, or Pancreatic cancer. ? ?ROS:   ?Please see the history of present illness.    ? All other systems reviewed and are negative. ? ?EKGs/Labs/Other Studies Reviewed:   ? ?  ? ? ?Recent Labs: ?02/28/2021: TSH 1.770 ?05/24/2021: Hemoglobin 12.2; Platelets 467 ?07/30/2021: ALT 32; BUN 11; Creatinine, Ser 0.79; Potassium 4.6; Sodium 139  ?Recent Lipid Panel ?   ?Component Value Date/Time  ? CHOL 159 07/30/2021 1046  ? TRIG 107 07/30/2021 1046  ? HDL 44 07/30/2021 1046  ? CHOLHDL 3.6 07/30/2021 1046  ? CHOLHDL 5 08/09/2019 1143  ? VLDL 42.8 (H) 08/09/2019 1143  ? Magna 95 07/30/2021 1046  ? LDLDIRECT 156.0 08/09/2019 1143  ? ? ?Physical Exam:   ? ?Physical Exam: ?Blood pressure 138/88, pulse 74, height '5\' 11"'$  (1.803 m), weight 206 lb (93.4 kg), SpO2 99 %. ? ?GEN:  Well nourished, well developed in no acute distress ?HEENT: Normal ?NECK: No JVD; No carotid bruits ?LYMPHATICS: No lymphadenopathy ?CARDIAC: RRR pectus excavatum , soft systolic murmur  ?RESPIRATORY:  Clear to auscultation without rales, wheezing or rhonchi  ?ABDOMEN: Soft, non-tender, non-distended ?MUSCULOSKELETAL:  No edema; No deformity  ?SKIN: Warm and dry ?NEUROLOGIC:  Alert and oriented x 3 ? ? ?EKG:     ? ?ASSESSMENT:   ? ?1. Mixed hyperlipidemia   ? ? ? ?PLAN:   ? ? 1.  Syncope: Elaine [name]  had an episode of syncope/orthostatic hypotension.  She got up in the middle of the night to use the bathroom and then woke up on the floor several minutes later.  She has known episodes of orthostatic hypotensio

## 2021-08-23 ENCOUNTER — Ambulatory Visit (INDEPENDENT_AMBULATORY_CARE_PROVIDER_SITE_OTHER): Payer: 59 | Admitting: Primary Care

## 2021-08-23 ENCOUNTER — Encounter: Payer: Self-pay | Admitting: Primary Care

## 2021-08-23 VITALS — BP 120/80 | HR 91 | Ht 71.0 in | Wt 210.4 lb

## 2021-08-23 DIAGNOSIS — N939 Abnormal uterine and vaginal bleeding, unspecified: Secondary | ICD-10-CM

## 2021-08-23 DIAGNOSIS — Z0000 Encounter for general adult medical examination without abnormal findings: Secondary | ICD-10-CM

## 2021-08-23 DIAGNOSIS — E039 Hypothyroidism, unspecified: Secondary | ICD-10-CM

## 2021-08-23 DIAGNOSIS — G8929 Other chronic pain: Secondary | ICD-10-CM

## 2021-08-23 DIAGNOSIS — E782 Mixed hyperlipidemia: Secondary | ICD-10-CM

## 2021-08-23 DIAGNOSIS — M545 Low back pain, unspecified: Secondary | ICD-10-CM

## 2021-08-23 DIAGNOSIS — G43109 Migraine with aura, not intractable, without status migrainosus: Secondary | ICD-10-CM | POA: Diagnosis not present

## 2021-08-23 DIAGNOSIS — Z8249 Family history of ischemic heart disease and other diseases of the circulatory system: Secondary | ICD-10-CM

## 2021-08-23 DIAGNOSIS — E8801 Alpha-1-antitrypsin deficiency: Secondary | ICD-10-CM

## 2021-08-23 MED ORDER — ZOLMITRIPTAN 5 MG PO TABS: ORAL_TABLET | ORAL | 0 refills | Status: DC

## 2021-08-23 MED ORDER — TOPIRAMATE 50 MG PO TABS: 50.0000 mg | ORAL_TABLET | Freq: Every day | ORAL | 0 refills | Status: DC

## 2021-08-23 NOTE — Assessment & Plan Note (Addendum)
Immunizations UTD. Mammogram UTD Pap smear UTD  Discussed the importance of a healthy diet and regular exercise in order for weight loss, and to reduce the risk of further co-morbidity.  Exam stable. Labs reviewed  Follow up in 1 year for repeat physical.

## 2021-08-23 NOTE — Assessment & Plan Note (Signed)
Overall stable.  Commended her on regular exercise.

## 2021-08-23 NOTE — Assessment & Plan Note (Signed)
More frequent.   Agree to resume Topamax.  Start Topamax 50 mg HS.  She will update.   Continue Zomig 5 mg PRN.

## 2021-08-23 NOTE — Progress Notes (Signed)
Subjective:    Patient ID: Elaine [name], female    DOB: 01-13-1976, 46 y.o.   MRN: [id number]  HPI  Elaine [name] is a very pleasant 46 y.o. female who presents today for complete physical and follow up of chronic conditions.  She would like to resume Topamax for migraine prevention. Previously managed on Topamax years ago and did well. She's not required Topamax for years as migraines have been minimal. Migraines have gradually progressed over the last few years, worse over last few months.  Immunizations: -Tetanus: 2015 -Influenza: Completed last season  -Covid-19: 5 vaccines  Diet: Fair diet. Mostly home cooked meals. Exercise: Recently began regular exercise at the [facility]  Eye exam: Completes annually  Dental exam: Completes semi-annually   Pap Smear: Completes per GYN.  Mammogram: Completed in March 2023  BP Readings from Last 3 Encounters:  08/23/21 120/80  07/30/21 138/88  06/27/21 127/83        Review of Systems  Constitutional:  Negative for unexpected weight change.  HENT:  Negative for rhinorrhea.   Respiratory:  Negative for cough and shortness of breath.   Cardiovascular:  Negative for chest pain.  Gastrointestinal:  Negative for constipation and diarrhea.  Genitourinary:  Positive for menstrual problem. Negative for difficulty urinating.  Musculoskeletal:  Positive for arthralgias and back pain.  Skin:  Negative for rash.  Allergic/Immunologic: Positive for environmental allergies.  Neurological:  Positive for headaches. Negative for dizziness.  Psychiatric/Behavioral:  The patient is not nervous/anxious.         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[place])    No compelte work up, but suspected   Headache    Heart murmur    " Muscial " murmer -   History of hiatal hernia    Hyperlipidemia    Malignant hyperthermia    Orthostatic hypotension    Psoriasis    Ruptured ovarian cyst 2004,2016   Seizures ([place])    none since age 56    Social History   Socioeconomic History   Marital status: Married    Spouse name: Not on file   Number of children: Not on file   Years of education: Not on file   Highest education level: Not on file  Occupational History   Not on file  Tobacco Use   Smoking status: Former    Packs/day: 0.00    Years: 5.00    Pack years: 0.00    Types: Cigarettes   Smokeless tobacco: Never  Vaping Use   Vaping Use: Never used  Substance and Sexual Activity   Alcohol use: Yes    Comment: ocassional- 1-2 drinks   Drug use: Never   Sexual activity: Yes  Other Topics Concern   Not on file  Social History Narrative   Married.   1 child.   Works as a Psychologist, sport and exercise.    Social Determinants of Health   Financial Resource Strain: Not on file  Food Insecurity: No Food Insecurity   Worried About [place] in the Last Year: Never true  Ran Out of Food in the Last Year: Never true  Transportation Needs: No Transportation Needs   Lack of Transportation (Medical): No   Lack of Transportation (Non-Medical): No  Physical Activity: Not on file  Stress: Not on file  Social Connections: Not on file  Intimate Partner Violence: Not on file    Past Surgical History:  Procedure Laterality Date   DENTAL SURGERY     DILITATION & CURRETTAGE/HYSTROSCOPY WITH HYDROTHERMAL ABLATION N/A 04/16/2021   Procedure: DILATATION & CURETTAGE/HYSTEROSCOPY WITH HYDROTHERMAL ABLATION;  Surgeon: [name], MD;  Location: [facility];  Service: Gynecology;  Laterality: N/A;  rep will be here confirmed on 04/08/21 CS   LAPAROSCOPIC SIGMOID COLECTOMY  2013    Family  History  Problem Relation Age of Onset   Stroke Father 30   Heart attack Father 51   Hyperlipidemia Father    Prostate cancer Father    Stomach cancer Neg Hx    Colon polyps Neg Hx    Esophageal cancer Neg Hx    Pancreatic cancer Neg Hx     Allergies  Allergen Reactions   Other Nausea And Vomiting    Opioids   Cephalexin Other (See Comments)    Blisters in mouth    Dilaudid  [Hydromorphone Hcl] Nausea And Vomiting   Penicillins Rash    Hasn't had since childhood Itching with Zosyn 05/2021    Current Outpatient Medications on File Prior to Visit  Medication Sig Dispense Refill   betamethasone dipropionate 0.05 % lotion 1 liberally Topical Daily     Cholecalciferol (VITAMIN D3 PO) Take 1 capsule by mouth daily.     fluticasone (FLONASE) 50 MCG/ACT nasal spray Place 2 sprays into both nostrils daily.     MAGNESIUM PO Take 1 capsule by mouth daily.     Melatonin 5 MG TABS Take 5 mg by mouth at bedtime as needed (Sleep).     rosuvastatin (CRESTOR) 5 MG tablet Take 1 tablet (5 mg total) by mouth daily. 90 tablet 3   No current facility-administered medications on file prior to visit.    BP 120/80   Pulse 91   Ht '5\' 11"'$  (1.803 m)   Wt 210 lb 6 oz (95.4 kg)   SpO2 97%   BMI 29.34 kg/m  Objective:   Physical Exam HENT:     Right Ear: Tympanic membrane and ear canal normal.     Left Ear: Tympanic membrane and ear canal normal.     Nose: Nose normal.  Eyes:     Conjunctiva/sclera: Conjunctivae normal.     Pupils: Pupils are equal, round, and reactive to light.  Neck:     Thyroid: No thyromegaly.  Cardiovascular:     Rate and Rhythm: Normal rate and regular rhythm.     Heart sounds: No murmur heard. Pulmonary:     Effort: Pulmonary effort is normal.     Breath sounds: Normal breath sounds. No rales.  Abdominal:     General: Bowel sounds are normal.     Palpations: Abdomen is soft.     Tenderness: There is no abdominal tenderness.  Musculoskeletal:        General:  Normal range of motion.     Cervical back: Neck supple.  Lymphadenopathy:     Cervical: No cervical adenopathy.  Skin:    General: Skin is warm and dry.     Findings: No rash.  Neurological:     Mental Status: She is alert and oriented to person, place,  and time.     Cranial Nerves: No cranial nerve deficit.     Deep Tendon Reflexes: Reflexes are normal and symmetric.          Assessment & Plan:

## 2021-08-23 NOTE — Assessment & Plan Note (Signed)
Controlled.  Continue rosuvastatin 5 mg daily. Reviewed labs and office notes from cardiology in May 2023.

## 2021-08-23 NOTE — Assessment & Plan Note (Signed)
TSH from December 2022 reviewed. Stable Remain off treatment. Following with endocrinology

## 2021-08-23 NOTE — Assessment & Plan Note (Signed)
Following with GYN, office notes from March 2023 reviewed.  Weaning off hormonal treatment.   No recent bleeding.

## 2021-08-23 NOTE — Patient Instructions (Signed)
Start Topamax 50 mg at bedtime for headache prevention.  It was a pleasure to see you today!  Preventive Care 33-45 Years Old, Female Preventive care refers to lifestyle choices and visits with your health care provider that can promote health and wellness. Preventive care visits are also called wellness exams. What can I expect for my preventive care visit? Counseling Your health care provider may ask you questions about your: Medical history, including: Past medical problems. Family medical history. Pregnancy history. Current health, including: Menstrual cycle. Method of birth control. Emotional well-being. Home life and relationship well-being. Sexual activity and sexual health. Lifestyle, including: Alcohol, nicotine or tobacco, and drug use. Access to firearms. Diet, exercise, and sleep habits. Work and work Statistician. Sunscreen use. Safety issues such as seatbelt and bike helmet use. Physical exam Your health care provider will check your: Height and weight. These may be used to calculate your BMI (body mass index). BMI is a measurement that tells if you are at a healthy weight. Waist circumference. This measures the distance around your waistline. This measurement also tells if you are at a healthy weight and may help predict your risk of certain diseases, such as type 2 diabetes and high blood pressure. Heart rate and blood pressure. Body temperature. Skin for abnormal spots. What immunizations do I need?  Vaccines are usually given at various ages, according to a schedule. Your health care provider will recommend vaccines for you based on your age, medical history, and lifestyle or other factors, such as travel or where you work. What tests do I need? Screening Your health care provider may recommend screening tests for certain conditions. This may include: Lipid and cholesterol levels. Diabetes screening. This is done by checking your blood sugar (glucose) after  you have not eaten for a while (fasting). Pelvic exam and Pap test. Hepatitis B test. Hepatitis C test. HIV (human immunodeficiency virus) test. STI (sexually transmitted infection) testing, if you are at risk. Lung cancer screening. Colorectal cancer screening. Mammogram. Talk with your health care provider about when you should start having regular mammograms. This may depend on whether you have a family history of breast cancer. BRCA-related cancer screening. This may be done if you have a family history of breast, ovarian, tubal, or peritoneal cancers. Bone density scan. This is done to screen for osteoporosis. Talk with your health care provider about your test results, treatment options, and if necessary, the need for more tests. Follow these instructions at home: Eating and drinking  Eat a diet that includes fresh fruits and vegetables, whole grains, lean protein, and low-fat dairy products. Take vitamin and mineral supplements as recommended by your health care provider. Do not drink alcohol if: Your health care provider tells you not to drink. You are pregnant, may be pregnant, or are planning to become pregnant. If you drink alcohol: Limit how much you have to 0-1 drink a day. Know how much alcohol is in your drink. In the [place] one drink equals one 12 oz bottle of beer (355 mL), one 5 oz glass of wine (148 mL), or one 1 oz glass of hard liquor (44 mL). Lifestyle Brush your teeth every morning and night with fluoride toothpaste. Floss one time each day. Exercise for at least 30 minutes 5 or more days each week. Do not use any products that contain nicotine or tobacco. These products include cigarettes, chewing tobacco, and vaping devices, such as e-cigarettes. If you need help quitting, ask your health care provider. Do not  use drugs. If you are sexually active, practice safe sex. Use a condom or other form of protection to prevent STIs. If you do not wish to become pregnant,  use a form of birth control. If you plan to become pregnant, see your health care provider for a prepregnancy visit. Take aspirin only as told by your health care provider. Make sure that you understand how much to take and what form to take. Work with your health care provider to find out whether it is safe and beneficial for you to take aspirin daily. Find healthy ways to manage stress, such as: Meditation, yoga, or listening to music. Journaling. Talking to a trusted person. Spending time with friends and family. Minimize exposure to UV radiation to reduce your risk of skin cancer. Safety Always wear your seat belt while driving or riding in a vehicle. Do not drive: If you have been drinking alcohol. Do not ride with someone who has been drinking. When you are tired or distracted. While texting. If you have been using any mind-altering substances or drugs. Wear a helmet and other protective equipment during sports activities. If you have firearms in your house, make sure you follow all gun safety procedures. Seek help if you have been physically or sexually abused. What's next? Visit your health care provider once a year for an annual wellness visit. Ask your health care provider how often you should have your eyes and teeth checked. Stay up to date on all vaccines. This information is not intended to replace advice given to you by your health care provider. Make sure you discuss any questions you have with your health care provider. Document Revised: 09/12/2020 Document Reviewed: 09/12/2020 Elsevier Patient Education  [place].

## 2021-08-23 NOTE — Assessment & Plan Note (Addendum)
Following with pulmonology. Reviewed office notes from February 2023.

## 2021-08-23 NOTE — Assessment & Plan Note (Signed)
Working with cardiology to keep risk factors low. Office notes and labs reviewed from May 2023.  Continue rosuvastatin 5 mg daily.

## 2021-09-19 ENCOUNTER — Other Ambulatory Visit: Payer: Self-pay | Admitting: Primary Care

## 2021-09-19 DIAGNOSIS — G43109 Migraine with aura, not intractable, without status migrainosus: Secondary | ICD-10-CM

## 2021-10-06 ENCOUNTER — Telehealth: Payer: Self-pay | Admitting: Primary Care

## 2021-10-06 DIAGNOSIS — G43109 Migraine with aura, not intractable, without status migrainosus: Secondary | ICD-10-CM

## 2021-10-06 MED ORDER — TOPIRAMATE 50 MG PO TABS: 50.0000 mg | ORAL_TABLET | Freq: Every day | ORAL | 0 refills | Status: DC

## 2021-10-09 MED ORDER — TOPIRAMATE 100 MG PO TABS
100.0000 mg | ORAL_TABLET | Freq: Every day | ORAL | 0 refills | Status: DC
Start: 2021-10-09 — End: 2022-01-04

## 2021-10-09 NOTE — Telephone Encounter (Signed)
Patient states [facility] has no record of refill. Elaine [name] states its suppose to be up to '100mg'$ . Patient is completely out.

## 2021-10-09 NOTE — Telephone Encounter (Signed)
It shows that Topamax 50 mg was sent to pharmacy 10/06/21. Patient stated that it is suppose to be 100 mg. Patient is completely out.

## 2021-10-09 NOTE — Telephone Encounter (Signed)
Patient notified as instructed by telephone and verbalized understanding. Patient stated that she had mentioned to [facility] during her daughter's appointment that she had increased her Topamax to 100 mg and was doing well on that. Patient stated that she would like 100 mg sent in for her.

## 2021-10-09 NOTE — Telephone Encounter (Signed)
Please kindly notify patient that I was waiting for her to update me regarding the need to increase her Topamax dose to 100 mg.  This was discussed during her last office visit\in May 2023. Historically her Topamax was prescribed by someone else.    If she has been taking 100 mg and has been doing well then I will send a prescription to her pharmacy.

## 2021-10-09 NOTE — Addendum Note (Signed)
Addended by: Pleas Koch on: 10/09/2021 04:35 PM   Modules accepted: Orders

## 2021-10-09 NOTE — Telephone Encounter (Signed)
Rx for Topamax 100 mg sent to pharmacy earlier this afternoon.

## 2021-10-11 ENCOUNTER — Telehealth: Payer: 59 | Admitting: Physician Assistant

## 2021-10-11 DIAGNOSIS — B3731 Acute candidiasis of vulva and vagina: Secondary | ICD-10-CM

## 2021-10-11 MED ORDER — FLUCONAZOLE 150 MG PO TABS: 150.0000 mg | ORAL_TABLET | Freq: Once | ORAL | 0 refills | Status: AC

## 2021-10-11 NOTE — Progress Notes (Unsigned)

## 2021-10-15 NOTE — Progress Notes (Signed)
I have spent 5 minutes in review of e-visit questionnaire, review and updating patient chart, medical decision making and response to patient.   Derald Lorge Cody Tinea Nobile, PA-C    

## 2021-11-07 ENCOUNTER — Encounter: Payer: Self-pay | Admitting: Family Medicine

## 2021-11-07 ENCOUNTER — Ambulatory Visit: Payer: 59 | Attending: Family Medicine | Admitting: Physical Therapy

## 2021-11-07 ENCOUNTER — Other Ambulatory Visit: Payer: Self-pay

## 2021-11-07 ENCOUNTER — Encounter: Payer: Self-pay | Admitting: Physical Therapy

## 2021-11-07 ENCOUNTER — Ambulatory Visit (INDEPENDENT_AMBULATORY_CARE_PROVIDER_SITE_OTHER): Payer: 59 | Admitting: Family Medicine

## 2021-11-07 VITALS — BP 131/92 | HR 78 | Ht 71.0 in | Wt 201.1 lb

## 2021-11-07 DIAGNOSIS — M6281 Muscle weakness (generalized): Secondary | ICD-10-CM | POA: Insufficient documentation

## 2021-11-07 DIAGNOSIS — N819 Female genital prolapse, unspecified: Secondary | ICD-10-CM | POA: Diagnosis not present

## 2021-11-07 DIAGNOSIS — R279 Unspecified lack of coordination: Secondary | ICD-10-CM | POA: Insufficient documentation

## 2021-11-07 DIAGNOSIS — Z01419 Encounter for gynecological examination (general) (routine) without abnormal findings: Secondary | ICD-10-CM

## 2021-11-07 DIAGNOSIS — R293 Abnormal posture: Secondary | ICD-10-CM | POA: Diagnosis present

## 2021-11-07 NOTE — Telephone Encounter (Signed)
Scanned in patient chart.
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
## 2021-11-07 NOTE — Progress Notes (Signed)
Subjective:     Elaine [name] is a 46 y.o. female and is here for a comprehensive physical exam. The patient reports no problems. She is s/p HTA and then aborted her submucosal fibroid. She is having regular cycles that are light and last for 2 days. Last one lasted 5 but still light and was with an acute viral illness. Reports some pelvic organ prolapse and would like referral to pelvic PT.  The following portions of the patient's history were reviewed and updated as appropriate: allergies, current medications, past family history, past medical history, past social history, past surgical history, and problem list.  Review of Systems Pertinent items noted in HPI and remainder of comprehensive ROS otherwise negative.   Objective:    BP (!) 131/92   Pulse 78   Ht '5\' 11"'$  (1.803 m)   Wt 201 lb 1.6 oz (91.2 kg)   BMI 28.05 kg/m  General appearance: alert, cooperative, and appears stated age Head: Normocephalic, without obvious abnormality, atraumatic Neck: no adenopathy, supple, symmetrical, trachea midline, and thyroid not enlarged, symmetric, no tenderness/mass/nodules Lungs: clear to auscultation bilaterally Breasts: normal appearance, no masses or tenderness Heart: regular rate and rhythm, S1, S2 normal, no murmur, click, rub or gallop Abdomen: soft, non-tender; bowel sounds normal; no masses,  no organomegaly Extremities: extremities normal, atraumatic, no cyanosis or edema Skin: Skin color, texture, turgor normal. No rashes or lesions Lymph nodes: Cervical, supraclavicular, and axillary nodes normal. Neurologic: Grossly normal    Assessment:    Healthy female exam.      Plan:  Female genital prolapse, unspecified type - Referral to Pelvic PT - Plan: Ambulatory referral to Physical Therapy  Encounter for gynecological examination without abnormal finding - doing well. Pap is up to date. Mammogram is up to date.  Return for please give info on [name] to get an  apppointment.    See After Visit Summary for Counseling Recommendations

## 2021-11-07 NOTE — Patient Instructions (Signed)

## 2021-11-12 ENCOUNTER — Ambulatory Visit: Payer: 59 | Admitting: Physical Therapy

## 2021-11-12 DIAGNOSIS — M6281 Muscle weakness (generalized): Secondary | ICD-10-CM | POA: Diagnosis not present

## 2021-11-12 DIAGNOSIS — R293 Abnormal posture: Secondary | ICD-10-CM

## 2021-11-12 DIAGNOSIS — R279 Unspecified lack of coordination: Secondary | ICD-10-CM
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
## 2021-11-21 ENCOUNTER — Ambulatory Visit: Payer: 59 | Admitting: Physical Therapy

## 2021-11-21 ENCOUNTER — Ambulatory Visit (INDEPENDENT_AMBULATORY_CARE_PROVIDER_SITE_OTHER): Payer: 59 | Admitting: Family Medicine

## 2021-11-21 ENCOUNTER — Encounter: Payer: Self-pay | Admitting: Family Medicine

## 2021-11-21 VITALS — BP 106/74 | HR 99 | Temp 97.9°F | Ht 64.0 in | Wt 202.4 lb

## 2021-11-21 DIAGNOSIS — J0100 Acute maxillary sinusitis, unspecified: Secondary | ICD-10-CM

## 2021-11-21 DIAGNOSIS — E8801 Alpha-1-antitrypsin deficiency: Secondary | ICD-10-CM | POA: Diagnosis not present

## 2021-11-21 MED ORDER — DOXYCYCLINE HYCLATE 100 MG PO TABS: 100.0000 mg | ORAL_TABLET | Freq: Two times a day (BID) | ORAL | 0 refills | Status: DC

## 2021-11-21 MED ORDER — FLUCONAZOLE 150 MG PO TABS: ORAL_TABLET | ORAL | 0 refills | Status: DC

## 2021-11-21 MED ORDER — PREDNISONE 20 MG PO TABS: ORAL_TABLET | ORAL | 0 refills | Status: DC

## 2021-11-21 NOTE — Progress Notes (Signed)
Elaine Tena T. Blease Capaldi, MD, [place] at [facility] [place], [address]  Phone: [phone]  FAX: [place] - 46 y.o. female  MRN [id number]  Date of Birth: May 06, 1975  Date: 11/21/2021  PCP: Pleas Koch, NP  Referral: Pleas Koch, NP  Chief Complaint  Patient presents with   Cough    Went to [place] over a month ago.  Family got sick on return. Everyone tested negative for Covid and has gotten better but Lucy can't seem to shake the cough and nasal congestion   Nasal Congestion        Ear Pain    Right   Subjective:   Elaine [name] is a 46 y.o. very pleasant female patient with Body mass index is 34.74 kg/m. who presents with the following:  Roughly 1 month ago the patient was on a trip with her family today or, on the return back from her flight she did have multiple people around her that were sick.  Subsequently the entire family got sick, and she and the remainder of her family also tested negative for COVID-19.  She continues to have some nasal congestion, and she has been having a persistent cough for 1 month.  Relevant history of alpha-1 antitrypsin deficiency, with some ongoing pulmonary symptoms for years.  She also right now has some ear pain and fullness on the right side.  There is also some pain in the maxillary sinus region.  On the plane right back, everyone   Had a lot of  Pulmonary - alpha-1 - pulmonary disease  R ear feels full and scratchy throat.     Review of Systems is noted in the HPI, as appropriate  Objective:   BP 106/74   Pulse 99   Temp 97.9 F (36.6 C) (Oral)   Ht '5\' 4"'$  (1.626 m)   Wt 202 lb 6 oz (91.8 kg)   SpO2 98%   BMI 34.74 kg/m    Gen: WDWN, NAD; alert,appropriate and cooperative throughout exam  HEENT: Normocephalic and atraumatic. Throat clear, w/o exudate, no LAD, R TM clear, L TM - good landmarks, No fluid present. rhinnorhea.   Left frontal and maxillary sinuses: Non-Tender Right frontal and maxillary sinuses: Tender at the max sinus  Neck: No ant or post LAD CV: RRR, No M/G/R Pulm: Breathing comfortably in no resp distress. no w/c/r Psych: full affect, pleasant   Laboratory and Imaging Data:  Assessment and Plan:     ICD-10-CM   1. Acute non-recurrent maxillary sinusitis  J01.00     2. Alpha-1-antitrypsin deficiency ([place])  E88.01      Focal right-sided maxillary sinusitis with referred pain to the ear.  Fairly obvious on exam, and I will treat with doxycycline the patient is penicillin allergic.  Given her alpha-1 antitrypsin deficiency and ongoing cough now greater than 1 month, think it be reasonable to give the patient some doxycycline and oral prednisone.  Medication Management during today's office visit: Meds ordered this encounter  Medications   fluconazole (DIFLUCAN) 150 MG tablet    Sig: Take 1 tab po today, repeat in 7 days if needed    Dispense:  2 tablet    Refill:  0   doxycycline (VIBRA-TABS) 100 MG tablet    Sig: Take 1 tablet (100 mg total) by mouth 2 (two) times daily.    Dispense:  20 tablet    Refill:  0  predniSONE (DELTASONE) 20 MG tablet    Sig: 2 tabs po daily for 5 days, then 1 tab po daily for 5 days    Dispense:  15 tablet    Refill:  0   Medications Discontinued During This Encounter  Medication Reason   Melatonin 5 MG TABS Patient Preference    Orders placed today for conditions managed today: No orders of the defined types were placed in this encounter.   Disposition: No follow-ups on file.  [employer] One speech-to-text software was used for transcription in this dictation.  Possible transcriptional errors can occur using Editor, commissioning.   Signed,  Maud Deed. Ayra Hodgdon, MD   Outpatient Encounter Medications as of 11/21/2021  Medication Sig   betamethasone dipropionate 0.05 % lotion 1 liberally Topical Daily   Cholecalciferol (VITAMIN D3 PO) Take  1 capsule by mouth daily.   doxycycline (VIBRA-TABS) 100 MG tablet Take 1 tablet (100 mg total) by mouth 2 (two) times daily.   fluconazole (DIFLUCAN) 150 MG tablet Take 1 tab po today, repeat in 7 days if needed   fluticasone (FLONASE) 50 MCG/ACT nasal spray Place 2 sprays into both nostrils daily.   MAGNESIUM PO Take 1 capsule by mouth daily.   predniSONE (DELTASONE) 20 MG tablet 2 tabs po daily for 5 days, then 1 tab po daily for 5 days   rosuvastatin (CRESTOR) 5 MG tablet Take 1 tablet (5 mg total) by mouth daily.   topiramate (TOPAMAX) 100 MG tablet Take 1 tablet (100 mg total) by mouth at bedtime. For headache prevention.   zolmitriptan (ZOMIG) 5 MG tablet Take 1 tablet by mouth at migraine onset, may repeat 2 hours later if migraine persists. Do not exceed 2 tablets in 24 hours.   [DISCONTINUED] Melatonin 5 MG TABS Take 5 mg by mouth at bedtime as needed (Sleep).   No facility-administered encounter medications on file as of 11/21/2021.

## 2021-11-27 ENCOUNTER — Ambulatory Visit: Payer: 59 | Admitting: Physical Therapy

## 2021-11-27 DIAGNOSIS — M6281 Muscle weakness (generalized): Secondary | ICD-10-CM | POA: Diagnosis not present
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
## 2021-12-04 ENCOUNTER — Ambulatory Visit: Payer: 59 | Attending: Family Medicine | Admitting: Physical Therapy

## 2021-12-04 DIAGNOSIS — R279 Unspecified lack of coordination: Secondary | ICD-10-CM | POA: Insufficient documentation

## 2021-12-04 DIAGNOSIS — M6281 Muscle weakness (generalized): Secondary | ICD-10-CM | POA: Insufficient documentation
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
## 2021-12-10 ENCOUNTER — Ambulatory Visit: Payer: 59 | Admitting: Physical Therapy

## 2021-12-25 ENCOUNTER — Encounter: Payer: Self-pay | Admitting: Physical Therapy

## 2022-01-01 ENCOUNTER — Encounter: Payer: Self-pay | Admitting: Physical Therapy

## 2022-01-04 ENCOUNTER — Other Ambulatory Visit: Payer: Self-pay | Admitting: Primary Care

## 2022-01-04 DIAGNOSIS — G43109 Migraine with aura, not intractable, without status migrainosus: Secondary | ICD-10-CM

## 2022-01-19 DIAGNOSIS — G43109 Migraine with aura, not intractable, without status migrainosus: Secondary | ICD-10-CM

## 2022-01-20 MED ORDER — ZOLMITRIPTAN 5 MG PO TABS: ORAL_TABLET | ORAL | 0 refills | Status: DC

## 2022-02-07 ENCOUNTER — Telehealth: Payer: 59 | Admitting: Physician Assistant

## 2022-02-07 ENCOUNTER — Telehealth: Payer: Self-pay

## 2022-02-07 DIAGNOSIS — U071 COVID-19: Secondary | ICD-10-CM | POA: Diagnosis not present

## 2022-02-07 MED ORDER — NIRMATRELVIR/RITONAVIR (PAXLOVID)TABLET: 3.0000 | ORAL_TABLET | Freq: Two times a day (BID) | ORAL | 0 refills | Status: AC

## 2022-02-07 NOTE — Telephone Encounter (Signed)
Per appt notes pt already has video visit 02/07/22 at 8:30. Gentry Fitz NP is out of office and sending note to Romilda Garret NP as Juluis Rainier.

## 2022-02-07 NOTE — Telephone Encounter (Signed)
Noted see that she was evaluated virtually.

## 2022-02-07 NOTE — Patient Instructions (Signed)
Elaine [name], thank you for joining Mar Daring, PA-C for today's virtual visit.  While this provider is not your primary care provider (PCP), if your PCP is located in our provider database this encounter information will be shared with them immediately following your visit.   [facility] account gives you access to today's visit and all your visits, tests, and labs performed at [facility] " click here if you don't have a [facility] account or go to [email or web address]  Consent: (Patient) Elaine [name] provided verbal consent for this virtual visit at the beginning of the encounter.  Current Medications:  Current Outpatient Medications:    nirmatrelvir/ritonavir EUA (PAXLOVID) 20 x 150 MG & 10 x '100MG'$  TABS, Take 3 tablets by mouth 2 (two) times daily for 5 days. (Take nirmatrelvir 150 mg two tablets twice daily for 5 days and ritonavir 100 mg one tablet twice daily for 5 days) Patient GFR is 95, Disp: 30 tablet, Rfl: 0   betamethasone dipropionate 0.05 % lotion, 1 liberally Topical Daily, Disp: , Rfl:    Cholecalciferol (VITAMIN D3 PO), Take 1 capsule by mouth daily., Disp: , Rfl:    doxycycline (VIBRA-TABS) 100 MG tablet, Take 1 tablet (100 mg total) by mouth 2 (two) times daily., Disp: 20 tablet, Rfl: 0   fluconazole (DIFLUCAN) 150 MG tablet, Take 1 tab po today, repeat in 7 days if needed, Disp: 2 tablet, Rfl: 0   fluticasone (FLONASE) 50 MCG/ACT nasal spray, Place 2 sprays into both nostrils daily., Disp: , Rfl:    MAGNESIUM PO, Take 1 capsule by mouth daily., Disp: , Rfl:    predniSONE (DELTASONE) 20 MG tablet, 2 tabs po daily for 5 days, then 1 tab po daily for 5 days, Disp: 15 tablet, Rfl: 0   rosuvastatin (CRESTOR) 5 MG tablet, Take 1 tablet (5 mg total) by mouth daily., Disp: 90 tablet, Rfl: 3   topiramate (TOPAMAX) 100 MG tablet, TAKE 1 TABLET (100 MG TOTAL) BY MOUTH AT BEDTIME. FOR HEADACHE PREVENTION., Disp: 90 tablet, Rfl: 1    zolmitriptan (ZOMIG) 5 MG tablet, Take 1 tablet by mouth at migraine onset, may repeat 2 hours later if migraine persists. Do not exceed 2 tablets in 24 hours., Disp: 10 tablet, Rfl: 0   Medications ordered in this encounter:  Meds ordered this encounter  Medications   nirmatrelvir/ritonavir EUA (PAXLOVID) 20 x 150 MG & 10 x '100MG'$  TABS    Sig: Take 3 tablets by mouth 2 (two) times daily for 5 days. (Take nirmatrelvir 150 mg two tablets twice daily for 5 days and ritonavir 100 mg one tablet twice daily for 5 days) Patient GFR is 95    Dispense:  30 tablet    Refill:  0    Order Specific Question:   Supervising Provider    Answer:   Chase Picket [id number]     *If you need refills on other medications prior to your next appointment, please contact your pharmacy*  Follow-Up: Call back or seek an in-person evaluation if the symptoms worsen or if the condition fails to improve as anticipated.  [facility] [phone]  Other Instructions  Paxlovid (Nirmatrelvir; Ritonavir) Tablets What is this medication? NIRMATRELVIR; RITONAVIR (NIR ma TREL vir; ri TOE na veer) treats mild to moderate COVID-19. It may help people who are at high risk of developing severe illness. It works by limiting the spread of the virus in your body. This medicine may be  used for other purposes; ask your health care provider or pharmacist if you have questions. COMMON BRAND NAME(S): PAXLOVID What should I tell my care team before I take this medication? They need to know if you have any of these conditions: Any allergies Any serious illness Kidney disease Liver disease An unusual or allergic reaction to nirmatrelvir, ritonavir, other medications, foods, dyes, or preservatives Pregnant or trying to get pregnant Breast-feeding How should I use this medication? This product contains 2 different medications that are packaged together. For the standard dose, take 2 pink tablets of nirmatrelvir with  1 white tablet of ritonavir (3 tablets total) by mouth with water twice daily. Talk to your care team if you have kidney disease. You may need a different dose. Swallow the tablets whole. You can take it with or without food. If it upsets your stomach, take it with food. Take all of this medication unless your care team tells you to stop it early. Keep taking it even if you think you are better. Talk to your care team about the use of this medication in children. While it may be prescribed for children as young as 12 years for selected conditions, precautions do apply. Overdosage: If you think you have taken too much of this medicine contact a poison control center or emergency room at once. NOTE: This medicine is only for you. Do not share this medicine with others. What if I miss a dose? If you miss a dose, take it as soon as you can unless it is more than 8 hours late. If it is more than 8 hours late, skip the missed dose. Take the next dose at the normal time. Do not take extra or 2 doses at the same time to make up for the missed dose. What may interact with this medication? Do not take this medication with any of the following medications: Alfuzosin Certain medications for anxiety or sleep like midazolam, triazolam Certain medications for cancer like apalutamide, enzalutamide Certain medications for cholesterol like lovastatin, simvastatin Certain medications for irregular heart beat like amiodarone, dronedarone, flecainide, propafenone, quinidine Certain medications for pain like meperidine, piroxicam Certain medications for psychotic disorders like clozapine, lurasidone, pimozide Certain medications for seizures like carbamazepine, phenobarbital, phenytoin Colchicine Eletriptan Eplerenone Ergot alkaloids like dihydroergotamine, ergonovine, ergotamine, methylergonovine Finerenone Flibanserin Ivabradine Lomitapide Naloxegol Ranolazine Rifampin Sildenafil Silodosin [facility]  W[facility] Tolvaptan Ubrogepant Voclosporin This medication may also interact with the following medications: Bedaquiline Birth control pills Bosentan Certain antibiotics like erythromycin or clarithromycin Certain medications for blood pressure like amlodipine, diltiazem, felodipine, nicardipine, nifedipine Certain medications for cancer like abemaciclib, ceritinib, dasatinib, encorafenib, ibrutinib, ivosidenib, neratinib, nilotinib, venetoclax, vinblastine, vincristine Certain medications for cholesterol like atorvastatin, rosuvastatin Certain medications for depression like bupropion, trazodone Certain medications for fungal infections like isavuconazonium, itraconazole, ketoconazole, voriconazole Certain medications for hepatitis C like elbasvir; grazoprevir, dasabuvir; ombitasvir; paritaprevir; ritonavir, glecaprevir; pibrentasvir, sofosbuvir; velpatasvir; voxilaprevir Certain medications for HIV or AIDS Certain medications for irregular heartbeat like lidocaine Certain medications that treat or prevent blood clots like rivaroxaban, warfarin Digoxin Fentanyl Medications that lower your chance of fighting infection like cyclosporine, sirolimus, tacrolimus Methadone Quetiapine Rifabutin Salmeterol Steroid medications like betamethasone, budesonide, ciclesonide, dexamethasone, fluticasone, methylprednisolone, mometasone, triamcinolone This list may not describe all possible interactions. Give your health care provider a list of all the medicines, herbs, non-prescription drugs, or dietary supplements you use. Also tell them if you smoke, drink alcohol, or use illegal drugs. Some items may interact with your medicine. What should I  watch for while using this medication? Your condition will be monitored carefully while you are receiving this medication. Visit your care team for regular checkups. Tell your care team if your symptoms do not start to get better or if they get worse. If you have  untreated HIV infection, this medication may lead to some HIV medications not working as well in the future. Estrogen and progestin hormones may not work as well while you are taking this medication. Your care team can help you find the contraceptive option that works for you. What side effects may I notice from receiving this medication? Side effects that you should report to your care team as soon as possible: Allergic reactions--skin rash, itching, hives, swelling of the face, lips, tongue, or throat Liver injury--right upper belly pain, loss of appetite, nausea, light-colored stool, dark yellow or brown urine, yellowing skin or eyes, unusual weakness or fatigue Redness, blistering, peeling, or loosening of the skin, including inside the mouth Side effects that usually do not require medical attention (report these to your care team if they continue or are bothersome): Change in taste Diarrhea General discomfort and fatigue Increase in blood pressure Muscle pain Nausea Stomach pain This list may not describe all possible side effects. Call your doctor for medical advice about side effects. You may report side effects to FDA at 1-800-FDA-1088. Where should I keep my medication? Keep out of the reach of children and pets. Store at room temperature between 20 and 25 degrees C (68 and 77 degrees F). Get rid of any unused medication after the expiration date. To get rid of medications that are no longer needed or have expired: Take the medication to a medication take-back program. Check with your pharmacy or law enforcement to find a location. If you cannot return the medication, check the label or package insert to see if the medication should be thrown out in the garbage or flushed down the toilet. If you are not sure, ask your care team. If it is safe to put it in the trash, take the medication out of the container. Mix the medication with cat litter, dirt, coffee grounds, or other unwanted  substance. Seal the mixture in a bag or container. Put it in the trash. NOTE: This sheet is a summary. It may not cover all possible information. If you have questions about this medicine, talk to your doctor, pharmacist, or health care provider.  2023 Elsevier/Gold Standard (2020-03-26 00:00:00)    If you have been instructed to have an in-person evaluation today at a local Urgent Care facility, please use the link below. It will take you to a list of all of our available [facility], including address, phone number and hours of operation. Please do not delay care.  [facility]  If you or a family member do not have a primary care provider, use the link below to schedule a visit and establish care. When you choose a [facility] primary care physician or advanced practice provider, you gain a long-term partner in health. Find a Primary Care Provider  Learn more about [facility]'s in-office and virtual care options: [facility]

## 2022-02-07 NOTE — Telephone Encounter (Signed)
[facility] - Client TELEPHONE ADVICE RECORD [facility] Patient Name: Elaine [name] Gender: Female DOB: 14-Mar-1976 Age: 46 Y 3 M 20 D Return Phone Number: [phone] (Primary) Address: City/ State/ Zip: [place]  [address] Client [facility] - Client Client Site [facility] - Night Provider Alma Friendly - NP Contact Type Call Who Is Calling Patient / Member / Family / Caregiver Call Type Triage / Clinical Relationship To Patient Self Return Phone Number [phone] (Primary) Chief Complaint Cold Symptom Reason for Call Symptomatic / Request for [name] states she is calling to make a virtual appointment. She has COVID and would like PAXOLVID. Translation No No Triage Reason Patient declined Nurse Assessment Nurse: Rolin Barry, RN, Levada Dy Date/Time Eilene Ghazi Time): 02/07/2022 8:16:39 AM Confirm and document reason for call. If symptomatic, describe symptoms. ---[name] call back , caller advised that she is in the waiting room for a virtual care appt with Urgent Care. Did not request any services at this time. Does the patient have any new or worsening symptoms? ---Yes Will a triage be completed? ---No Select reason for no triage. ---Patient declined Please document clinical information provided and list any resource used. ---Advised caller to give [place] a call if she needed anything else. Disp. Time Eilene Ghazi Time) Disposition Final User 02/07/2022 8:19:29 AM Clinical Call Yes Rolin Barry, RN, Levada Dy Final Disposition 02/07/2022 8:19:29 AM Clinical Call Yes Krienke, RN, Levada Dy Comments User: Mayford Knife, RN Date/Time Eilene Ghazi Time): 02/07/2022 8:37:25 AM Chart resent to Mearl Latin, nurse at office

## 2022-02-07 NOTE — Progress Notes (Signed)
Virtual Visit Consent   Elaine [name], you are scheduled for a virtual visit with a [facility] provider today. Just as with appointments in the office, your consent must be obtained to participate. Your consent will be active for this visit and any virtual visit you may have with one of our providers in the next 365 days. If you have a MyChart account, a copy of this consent can be sent to you electronically.  As this is a virtual visit, video technology does not allow for your provider to perform a traditional examination. This may limit your provider's ability to fully assess your condition. If your provider identifies any concerns that need to be evaluated in person or the need to arrange testing (such as labs, EKG, etc.), we will make arrangements to do so. Although advances in technology are sophisticated, we cannot ensure that it will always work on either your end or our end. If the connection with a video visit is poor, the visit may have to be switched to a telephone visit. With either a video or telephone visit, we are not always able to ensure that we have a secure connection.  By engaging in this virtual visit, you consent to the provision of healthcare and authorize for your insurance to be billed (if applicable) for the services provided during this visit. Depending on your insurance coverage, you may receive a charge related to this service.  I need to obtain your verbal consent now. Are you willing to proceed with your visit today? OLINE BELK has provided verbal consent on 02/07/2022 for a virtual visit (video or telephone). Mar Daring, PA-C  Date: 02/07/2022 8:40 AM  Virtual Visit via Video Note   I, Mar Daring, connected with  Elaine [name]  ([id number], 30-Mar-1976) on 02/07/22 at  8:30 AM EST by a video-enabled telemedicine application and verified that I am speaking with the correct person using two identifiers.  Location: Patient: Virtual Visit Location  Patient: Home Provider: Virtual Visit Location Provider: Home Office   I discussed the limitations of evaluation and management by telemedicine and the availability of in person appointments. The patient expressed understanding and agreed to proceed.    History of Present Illness: Elaine [name] is a 46 y.o. who identifies as a female who was assigned female at birth, and is being seen today for Covid 35.  HPI: URI  This is a new problem. The current episode started in the past 7 days (Symptoms started Monday night; Tested positive on at home test on Tuesday). The problem has been gradually worsening. The fever has been present for Less than 1 day. Associated symptoms include chest pain, congestion, diarrhea, headaches, nausea, rhinorrhea, a sore throat and vomiting. Associated symptoms comments: Sense of taste and smell have been off and now has none. She has tried acetaminophen for the symptoms. The treatment provided no relief.   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   Migraine with aura and without status migrainosus, not  intractable 05/31/2018   Family history of cardiovascular disease 02/03/2018   Fatigue 12/02/2017   Chronic pain of right wrist 08/12/2017   Mass of neck 06/01/2017   Preventative health care 12/24/2016   Hypothyroidism 11/26/2016   Hyperlipidemia 11/26/2016   Vitamin B 12 deficiency 11/26/2016   Vitamin D deficiency 11/26/2016   Psoriasis 11/26/2016    Alpha-1-antitrypsin deficiency ([place]) 11/26/2016   GERD (gastroesophageal reflux disease) 11/26/2016    Allergies:  Allergies  Allergen Reactions   Other Nausea And Vomiting    Opioids   Cephalexin Other (See Comments)    Blisters in mouth    Penicillins Rash    Hasn't had since childhood Itching with Zosyn 05/2021   Medications:  Current Outpatient Medications:    betamethasone dipropionate 0.05 % lotion, 1 liberally Topical Daily, Disp: , Rfl:    Cholecalciferol (VITAMIN D3 PO), Take 1 capsule by mouth daily., Disp: , Rfl:    doxycycline (VIBRA-TABS) 100 MG tablet, Take 1 tablet (100 mg total) by mouth 2 (two) times daily., Disp: 20 tablet, Rfl: 0   fluconazole (DIFLUCAN) 150 MG tablet, Take 1 tab po today, repeat in 7 days if needed, Disp: 2 tablet, Rfl: 0   fluticasone (FLONASE) 50 MCG/ACT nasal spray, Place 2 sprays into both nostrils daily., Disp: , Rfl:    MAGNESIUM PO, Take 1 capsule by mouth daily., Disp: , Rfl:    nirmatrelvir/ritonavir EUA (PAXLOVID) 20 x 150 MG & 10 x '100MG'$  TABS, Take 3 tablets by mouth 2 (two) times daily for 5 days. (Take nirmatrelvir 150 mg two tablets twice daily for 5 days and ritonavir 100 mg one tablet twice daily for 5 days) Patient GFR is 95, Disp: 30 tablet, Rfl: 0   predniSONE (DELTASONE) 20 MG tablet, 2 tabs po daily for 5 days, then 1 tab po daily for 5 days, Disp: 15 tablet, Rfl: 0   rosuvastatin (CRESTOR) 5 MG tablet, Take 1 tablet (5 mg total) by mouth daily., Disp: 90 tablet, Rfl: 3   topiramate (TOPAMAX) 100 MG tablet, TAKE 1 TABLET (100 MG TOTAL) BY MOUTH AT BEDTIME. FOR HEADACHE PREVENTION., Disp: 90 tablet, Rfl: 1   zolmitriptan (ZOMIG) 5 MG tablet, Take 1 tablet by mouth at migraine onset, may repeat 2 hours later if migraine persists. Do not exceed 2 tablets in 24 hours., Disp: 10 tablet, Rfl: 0  Observations/Objective: Patient is well-developed, well-nourished in no acute distress.  Resting comfortably at home.  Head is normocephalic,  atraumatic.  No labored breathing.  Speech is clear and coherent with logical content.  Patient is alert and oriented at baseline.    Assessment and Plan: 1. COVID-19 - nirmatrelvir/ritonavir EUA (PAXLOVID) 20 x 150 MG & 10 x '100MG'$  TABS; Take 3 tablets by mouth 2 (two) times daily for 5 days. (Take nirmatrelvir 150 mg two tablets twice daily for 5 days and ritonavir 100 mg one tablet twice daily for 5 days) Patient GFR is 95  Dispense: 30 tablet; Refill: 0  - Continue OTC symptomatic management of choice - Will send OTC vitamins and supplement information through AVS - Paxlovid prescribed - Patient enrolled in MyChart symptom monitoring - Push fluids - Rest as needed - Discussed return precautions and when to seek in-person evaluation, sent via AVS as well   Follow Up Instructions: I discussed the assessment and treatment plan with the patient. The patient was provided an opportunity to ask questions and all were answered. The patient agreed with the plan and demonstrated an understanding  of the instructions.  A copy of instructions were sent to the patient via MyChart unless otherwise noted below.    The patient was advised to call back or seek an in-person evaluation if the symptoms worsen or if the condition fails to improve as anticipated.  Time:  I spent 10 minutes with the patient via telehealth technology discussing the above problems/concerns.    Mar Daring, PA-C

## 2022-02-14 ENCOUNTER — Telehealth: Payer: 59 | Admitting: Primary Care
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
## 2022-02-26 ENCOUNTER — Telehealth: Payer: Self-pay | Admitting: Psychiatry

## 2022-02-26 ENCOUNTER — Ambulatory Visit (INDEPENDENT_AMBULATORY_CARE_PROVIDER_SITE_OTHER): Payer: 59 | Admitting: Psychiatry

## 2022-02-26 ENCOUNTER — Encounter: Payer: Self-pay | Admitting: Psychiatry

## 2022-02-26 VITALS — BP 135/92 | HR 74 | Ht 71.0 in | Wt 202.0 lb

## 2022-02-26 DIAGNOSIS — G43109 Migraine with aura, not intractable, without status migrainosus: Secondary | ICD-10-CM

## 2022-02-26 DIAGNOSIS — R29818 Other symptoms and signs involving the nervous system: Secondary | ICD-10-CM

## 2022-02-26 DIAGNOSIS — R519 Headache, unspecified: Secondary | ICD-10-CM | POA: Diagnosis not present

## 2022-02-26 DIAGNOSIS — R200 Anesthesia of skin: Secondary | ICD-10-CM | POA: Diagnosis not present

## 2022-02-26 MED ORDER — EMGALITY 120 MG/ML ~~LOC~~ SOAJ: 120.0000 mg | SUBCUTANEOUS | 5 refills | Status: DC

## 2022-02-26 MED ORDER — EMGALITY 120 MG/ML ~~LOC~~ SOAJ: 240.0000 mg | Freq: Once | SUBCUTANEOUS | 0 refills | Status: AC

## 2022-02-26 NOTE — Patient Instructions (Addendum)
Decrease Topamax to 1/2 pill at bedtime for one week, then stop  Start Emgality monthly injection for migraine prevention  Try Nurtec as needed for migraine. Take one pill as needed for migraine. Max dose one pill in 24 hours.  MRI brain and C-spine

## 2022-02-26 NOTE — Telephone Encounter (Signed)
[employer] sent to [facility] they obtain auth [id number] 

## 2022-03-11 ENCOUNTER — Encounter: Payer: Self-pay | Admitting: Psychiatry

## 2022-03-13 ENCOUNTER — Ambulatory Visit
Admission: RE | Admit: 2022-03-13 | Discharge: 2022-03-13 | Disposition: A | Discharge status: Home / Self Care | Payer: 59 | Source: Ambulatory Visit | Attending: Psychiatry | Admitting: Psychiatry

## 2022-03-13 ENCOUNTER — Other Ambulatory Visit: Payer: 59

## 2022-03-13 DIAGNOSIS — R519 Headache, unspecified: Secondary | ICD-10-CM | POA: Diagnosis not present

## 2022-03-13 DIAGNOSIS — R29818 Other symptoms and signs involving the nervous system: Secondary | ICD-10-CM

## 2022-03-13 DIAGNOSIS — R200 Anesthesia of skin: Secondary | ICD-10-CM | POA: Diagnosis not present

## 2022-03-13 MED ORDER — GADOPICLENOL 0.5 MMOL/ML IV SOLN
9.0000 mL | Freq: Once | INTRAVENOUS | Status: AC | PRN
  Administered 2022-03-13: 9 mL via INTRAVENOUS

## 2022-04-29 ENCOUNTER — Other Ambulatory Visit: Payer: Self-pay | Admitting: Family Medicine

## 2022-04-29 DIAGNOSIS — Z1231 Encounter for screening mammogram for malignant neoplasm of breast: Secondary | ICD-10-CM

## 2022-06-12 ENCOUNTER — Ambulatory Visit
Admission: RE | Admit: 2022-06-12 | Discharge: 2022-06-12 | Disposition: A | Discharge status: Home / Self Care | Payer: 59 | Source: Ambulatory Visit | Attending: Family Medicine | Admitting: Family Medicine

## 2022-06-12 DIAGNOSIS — Z1231 Encounter for screening mammogram for malignant neoplasm of breast: Secondary | ICD-10-CM

## 2022-06-17 ENCOUNTER — Other Ambulatory Visit: Payer: Self-pay | Admitting: Family Medicine

## 2022-06-17 DIAGNOSIS — R928 Other abnormal and inconclusive findings on diagnostic imaging of breast: Secondary | ICD-10-CM

## 2022-06-26 ENCOUNTER — Other Ambulatory Visit: Payer: Self-pay | Admitting: Family Medicine

## 2022-06-26 ENCOUNTER — Ambulatory Visit
Admission: RE | Admit: 2022-06-26 | Discharge: 2022-06-26 | Disposition: A | Discharge status: Home / Self Care | Payer: 59 | Source: Ambulatory Visit | Attending: Family Medicine | Admitting: Family Medicine

## 2022-06-26 DIAGNOSIS — R928 Other abnormal and inconclusive findings on diagnostic imaging of breast: Secondary | ICD-10-CM

## 2022-06-26 NOTE — Progress Notes (Signed)
Referring:  Pleas Koch, NP [place],  [place] [address]  PCP: Pleas Koch, NP  Neurology was asked to evaluate Elaine [name], a 47 year old female for a chief complaint of headaches.  Our recommendations of care will be communicated by shared medical record.    CC:  headaches  History provided from self  Follow-up visit:  Prior visit: 02/26/2022 Dr. Billey Gosling (initial consult visit)  Brief HPI:   Elaine [name] is a 47 y.o. female with PMH of HLD, fibroids and alpha-1 antitrypsin deficiency who is being followed for migraine headaches with aura which began around age 75.  At prior visit, she was tapered off of Topamax and started on Emgality for prevention and continued on Zomig for rescue with Nurtec samples provided.  Recommended completion of MRI brain and C-spine.   Interval history:  Reports great improvement of migraine headaches since starting Emgality.  Currently experiencing 1-2 migraines per month, can have a little worsening with increased stress.  Tolerating Emgality well. Did trial Nurtec but went to sleep shortly after, unsure if beneficial. Continues to Zomig with benefit, will resolve after 1-1.5 hours. Has since completely tapered off topiramate, has noted improvement of numbness/tingling sensation and cognitive difficulties including language impairment.  Continues to have cervicalgia, does do weight lifting 3x per week and does routine neck stretching exercises. She would be interested in PT with dry needling for cervicalgia if this would be helpful.    MRI brain unremarkable MRI C-spine showed mild disc bulging at C5-6 and C6-7 without nerve compression      Migraine days per month: 1-2 Headache free days per month: 28-29  Current Treatment: Abortive Zomig 5 mg PRN  Preventative Emgality  Prior Therapies                                 Zomig 5 mg PRN Topamax 100 mg QHS -paresthesias and cognitive impairment Lexapro - weight  gain Amitriptyline - weight gain Ibuprofen Tylenol PM Imitrex - palpitations Relpax - drowsiness    LABS: CBC    Component Value Date/Time   WBC 16.0 (H) 05/24/2021 0203   RBC 4.14 05/24/2021 0203   HGB 12.2 05/24/2021 0203   HGB 14.5 02/28/2021 1030   HCT 36.5 05/24/2021 0203   HCT 43.0 02/28/2021 1030   PLT 467 (H) 05/24/2021 0203   PLT 368 02/28/2021 1030   MCV 88.2 05/24/2021 0203   MCV 90 02/28/2021 1030   MCH 29.5 05/24/2021 0203   MCHC 33.4 05/24/2021 0203   RDW 12.8 05/24/2021 0203   RDW 12.9 02/28/2021 1030   LYMPHSABS 3.1 05/20/2021 0057   MONOABS 1.0 05/20/2021 0057   EOSABS 0.2 05/20/2021 0057   BASOSABS 0.1 05/20/2021 0057      Latest Ref Rng & Units 07/30/2021   10:46 AM 05/23/2021    4:11 AM 05/22/2021    1:04 AM  CMP  Glucose 70 - 99 mg/dL 90  98  97   BUN 6 - 24 mg/dL 11  7  9    Creatinine 0.57 - 1.00 mg/dL 0.79  0.81  1.11   Sodium 134 - 144 mmol/L 139  137  139   Potassium 3.5 - 5.2 mmol/L 4.6  4.3  4.4   Chloride 96 - 106 mmol/L 103  108  104   CO2 20 - 29 mmol/L 22  19  24    Calcium  8.7 - 10.2 mg/dL 10.1  9.1  9.0   Total Protein 6.5 - 8.1 g/dL  6.8  6.2   Total Bilirubin 0.3 - 1.2 mg/dL  0.4  0.2   Alkaline Phos 38 - 126 U/L  57  51   AST 15 - 41 U/L  12  12   ALT 0 - 32 IU/L 32  14  11     IMAGING:  none   Current Outpatient Medications on File Prior to Visit  Medication Sig Dispense Refill   betamethasone dipropionate 0.05 % lotion 1 liberally Topical Daily     Cholecalciferol (VITAMIN D3 PO) Take 1 capsule by mouth daily.     fluticasone (FLONASE) 50 MCG/ACT nasal spray Place 2 sprays into both nostrils daily.     Galcanezumab-gnlm (EMGALITY) 120 MG/ML SOAJ Inject 120 mg into the skin every 30 (thirty) days. 1.12 mL 5   MAGNESIUM PO Take 1 capsule by mouth daily.     rosuvastatin (CRESTOR) 5 MG tablet Take 1 tablet (5 mg total) by mouth daily. 90 tablet 3   zolmitriptan (ZOMIG) 5 MG tablet Take 1 tablet by mouth at migraine onset,  may repeat 2 hours later if migraine persists. Do not exceed 2 tablets in 24 hours. 10 tablet 0   topiramate (TOPAMAX) 100 MG tablet TAKE 1 TABLET (100 MG TOTAL) BY MOUTH AT BEDTIME. FOR HEADACHE PREVENTION. (Patient not taking: Reported on 06/30/2022) 90 tablet 1   No current facility-administered medications on file prior to visit.     Allergies: Allergies  Allergen Reactions   Other Nausea And Vomiting    Opioids   Cephalexin Other (See Comments)    Blisters in mouth    Penicillins Rash    Hasn't had since childhood Itching with Zosyn 05/2021    Family History: Migraine or other headaches in the family:  grandmother and daughter have migraines Aneurysms in a first degree relative:  no Brain tumors in the family:  no Other neurological illness in the family:   no  Past Medical History: Past Medical History:  Diagnosis Date   ADHD (attention deficit hyperactivity disorder)    Alpha-1-antitrypsin deficiency    Arthritis    Asthma    Bulging lumbar disc    Diverticulitis    with sepsis   Diverticulosis    Family history of adverse reaction to anesthesia    malignant Hyperthermia- father and sister   Fibroids    Vaginal   GERD (gastroesophageal reflux disease)    Giantism    No compelte work up, but suspected   Headache    Heart murmur    " Muscial " murmer -   History of hiatal hernia    Hyperlipidemia    Malignant hyperthermia    Orthostatic hypotension    Psoriasis    Ruptured ovarian cyst 2004,2016   Seizures    none since age 49    Past Surgical History Past Surgical History:  Procedure Laterality Date   DENTAL SURGERY     DILITATION & CURRETTAGE/HYSTROSCOPY WITH HYDROTHERMAL ABLATION N/A 04/16/2021   Procedure: DILATATION & CURETTAGE/HYSTEROSCOPY WITH HYDROTHERMAL ABLATION;  Surgeon: [name], MD;  Location: [facility];  Service: Gynecology;  Laterality: N/A;  rep will be here confirmed on 04/08/21 CS   LAPAROSCOPIC SIGMOID COLECTOMY  2013    Social  History: Social History   Tobacco Use   Smoking status: Former    Packs/day: 0.00    Years: 5.00    Additional pack  years: 0.00    Total pack years: 0.00    Types: Cigarettes   Smokeless tobacco: Never  Vaping Use   Vaping Use: Never used  Substance Use Topics   Alcohol use: Yes    Comment: ocassional- 1-2 drinks   Drug use: Never    ROS: Negative for fevers, chills. Positive for headaches. All other systems reviewed and negative unless stated otherwise in HPI.   Physical Exam:   Vital Signs: BP (!) 133/91 (BP Location: Right Arm, Patient Position: Sitting, Cuff Size: Normal)   Pulse 75   Ht 5\' 11"  (1.803 m)   Wt 207 lb (93.9 kg)   BMI 28.87 kg/m  GENERAL: well appearing,in no acute distress,alert SKIN:  Color, texture, turgor normal. No rashes or lesions HEAD:  Normocephalic/atraumatic. CV:  RRR RESP: Normal respiratory effort MSK: +tenderness to palpation over bilateral occiput, neck, and shoulders  NEUROLOGICAL: Mental Status: Alert, oriented to person, place and time,Follows commands Cranial Nerves: PERRL, visual fields intact to confrontation, extraocular movements intact, no facial droop or ptosis, hearing grossly intact, no dysarthria, palate elevate symmetrically, tongue protrudes midline, shoulder shrug intact and symmetric Motor: muscle strength 5/5 both upper and lower extremities,no drift, normal tone Reflexes: 2+ bilateral upper extremities, brisk bilateral patellar reflexes without spread Sensation: decreased sensation light touch LUE and LLE Coordination: Finger-to- nose-finger intact bilaterally Gait: normal-based     IMPRESSION: 47 year old female with a history of HLD, fibroids, alpha 1 antitrypsin deficiency who presents for follow-up of migraines.  MRI brain normal.  MRI cervical showed mild disc bulging at C5-6 and C6-7 without any nerve compression.  Great improvement in migraine headaches since initiating Emgality.  Continued use of Zomig  with benefit.  Continued cervicalgia.    PLAN: -Prevention: Continue Emgality for migraine prevention -Rescue: Continue Zomig 5 mg PRN for now. Nurtec sample provided, will send in prescription if this is effective -Referral placed to PT for cervicalgia -Next steps: consider zonisamide, qulipta for prevention    Follow-up in 6 months or call earlier if needed   I spent 21 minutes of face-to-face and non-face-to-face time with patient.  This included previsit chart review, lab review, study review, order entry, electronic health record documentation, patient education and discussion regarding the above and answered all other questions to patient's satisfaction  Frann Rider, [facility]  [facility] [address] [place], [place] [address]  Phone [phone] Fax [phone] Note: This document was prepared with digital dictation and possible smart phrase technology. Any transcriptional errors that result from this process are unintentional.

## 2022-06-30 ENCOUNTER — Ambulatory Visit: Payer: 59 | Admitting: Adult Health

## 2022-06-30 ENCOUNTER — Encounter: Payer: Self-pay | Admitting: Adult Health

## 2022-06-30 VITALS — BP 133/91 | HR 75 | Ht 71.0 in | Wt 207.0 lb

## 2022-06-30 DIAGNOSIS — G43109 Migraine with aura, not intractable, without status migrainosus: Secondary | ICD-10-CM

## 2022-06-30 DIAGNOSIS — M542 Cervicalgia: Secondary | ICD-10-CM | POA: Diagnosis not present

## 2022-06-30 MED ORDER — ZOLMITRIPTAN 5 MG PO TABS: ORAL_TABLET | ORAL | 11 refills | Status: DC

## 2022-06-30 MED ORDER — EMGALITY 120 MG/ML ~~LOC~~ SOAJ: 120.0000 mg | SUBCUTANEOUS | 11 refills | Status: DC

## 2022-06-30 NOTE — Patient Instructions (Addendum)
Your Plan:  Continue Emgality monthly injection for migraine prevention  Continue Zomig as needed for migraine rescue   Referral placed to PT for neck pain - you will be called to schedule     Follow-up in 6 months or call earlier if needed     Thank you for coming to see [place] at [facility]. I hope we have been able to provide you high quality care today.  You may receive a patient satisfaction survey over the next few weeks. We would appreciate your feedback and comments so that we may continue to improve ourselves and the health of our patients.

## 2022-07-04 ENCOUNTER — Ambulatory Visit
Admission: RE | Admit: 2022-07-04 | Discharge: 2022-07-04 | Disposition: A | Discharge status: Home / Self Care | Payer: 59 | Source: Ambulatory Visit | Attending: Family Medicine | Admitting: Family Medicine

## 2022-07-04 DIAGNOSIS — R928 Other abnormal and inconclusive findings on diagnostic imaging of breast: Secondary | ICD-10-CM

## 2022-07-04 HISTORY — PX: BREAST BIOPSY: SHX20

## 2022-07-07 ENCOUNTER — Telehealth: Payer: Self-pay | Admitting: Cardiovascular Disease

## 2022-07-07 DIAGNOSIS — E782 Mixed hyperlipidemia: Secondary | ICD-10-CM

## 2022-07-07 MED ORDER — ROSUVASTATIN CALCIUM 5 MG PO TABS: 5.0000 mg | ORAL_TABLET | Freq: Every day | ORAL | 0 refills | Status: DC

## 2022-07-07 NOTE — Telephone Encounter (Signed)
*  STAT* If patient is at the pharmacy, call can be transferred to refill team.   1. Which medications need to be refilled? (please list name of each medication and dose if known) rosuvastatin (CRESTOR) 5 MG tablet   2. Which pharmacy/location (including street and city if local pharmacy) is medication to be sent to? [id number] - [place], [place] - [address]   3. Do they need a 30 day or 90 day supply? 30

## 2022-07-07 NOTE — Telephone Encounter (Signed)
Pt scheduled to see Eligha Bridegroom, NP, 08/27/22.  90 day refill [facility].

## 2022-07-08 ENCOUNTER — Other Ambulatory Visit: Payer: 59

## 2022-07-22 ENCOUNTER — Ambulatory Visit: Payer: 59 | Admitting: Physical Therapy

## 2022-07-22 ENCOUNTER — Ambulatory Visit: Payer: 59 | Admitting: Internal Medicine

## 2022-07-31 ENCOUNTER — Ambulatory Visit: Payer: 59 | Admitting: Internal Medicine

## 2022-07-31 ENCOUNTER — Encounter: Payer: Self-pay | Admitting: Internal Medicine

## 2022-07-31 VITALS — BP 116/86 | HR 81 | Temp 98.1°F | Ht 71.0 in | Wt 207.2 lb

## 2022-07-31 DIAGNOSIS — Q676 Pectus excavatum: Secondary | ICD-10-CM

## 2022-07-31 DIAGNOSIS — R0602 Shortness of breath: Secondary | ICD-10-CM | POA: Diagnosis not present

## 2022-07-31 MED ORDER — FLUTICASONE FUROATE-VILANTEROL 100-25 MCG/ACT IN AEPB: 1.0000 | INHALATION_SPRAY | Freq: Every day | RESPIRATORY_TRACT | 3 refills | Status: DC

## 2022-07-31 NOTE — Progress Notes (Signed)
The patient has been prescribed the inhaler breo. Inhaler technique was demonstrated to patient. The patient subsequently demonstrated correct technique.  

## 2022-07-31 NOTE — Progress Notes (Signed)
Elaine [name]    [id number]    Sep 17, 1975  Primary Care Physician:Clark, Keane Scrape, NP Date of Appointment: 07/31/2022 Established Patient Visit  Chief complaint:   Chief Complaint  Patient presents with   Follow-up    Pt advises she is okay     HPI: Elaine [name] is a 47 y.o. woman wwith A1AT heterozygote and mild pectus excavatum.   Interval Updates: Here for follow up. Has had an eventful few months. 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 Also diagnosed with Spinal stenosis, chiari malformation, bone spur, and arthritis. Migraines better on emgality.  Still short of breath - good days and bad days. She has tried advair in the past and had bone pain as adverse effect. It did help her breathing.    I have reviewed the patient's family social and past medical history and updated as appropriate.   Past Medical History:  Diagnosis Date   ADHD (attention deficit hyperactivity disorder)    Alpha-1-antitrypsin deficiency (HCC)    Arthritis    Asthma    Bulging lumbar disc    Diverticulitis    with sepsis   Diverticulosis    Family history of adverse reaction to anesthesia    malignant Hyperthermia- father and sister   Fibroids    Vaginal   GERD (gastroesophageal reflux disease)    Giantism (HCC)    No compelte work up, but suspected   Headache    Heart murmur    " Muscial " murmer -   History of hiatal hernia    Hyperlipidemia    Malignant hyperthermia    Orthostatic hypotension    Psoriasis    Ruptured ovarian cyst 2004,2016   Seizures (HCC)    none since age 27    Past Surgical History:  Procedure Laterality Date   BREAST BIOPSY Right 07/04/2022   [place] RT BREAST BX W LOC DEV 1ST LESION IMG BX SPEC US GUIDE 07/04/2022 [facility]   DENTAL SURGERY     DILITATION & CURRETTAGE/HYSTROSCOPY WITH HYDROTHERMAL ABLATION N/A 04/16/2021   Procedure: DILATATION & CURETTAGE/HYSTEROSCOPY WITH HYDROTHERMAL  ABLATION;  Surgeon: Reva Bores, MD;  Location: [facility];  Service: Gynecology;  Laterality: N/A;  rep will be here confirmed on 04/08/21 CS   LAPAROSCOPIC SIGMOID COLECTOMY  2013    Family History  Problem Relation Age of Onset   Stroke Father 78   Heart attack Father 31   Hyperlipidemia Father    Prostate cancer Father    Stomach cancer Neg Hx    Colon polyps Neg Hx    Esophageal cancer Neg Hx    Pancreatic cancer Neg Hx     Social History   Occupational History   Not on file  Tobacco Use   Smoking status: Former    Packs/day: 0.00    Years: 5.00    Additional pack years: 0.00    Total pack years: 0.00    Types: Cigarettes   Smokeless tobacco: Never  Vaping Use   Vaping Use: Never used  Substance and Sexual Activity   Alcohol use: Yes    Comment: ocassional- 1-2 drinks   Drug use: Never   Sexual activity: Yes     Physical Exam: Blood pressure 116/86, pulse 81, temperature 98.1 F (36.7 C), temperature source Oral, height 5\' 11"  (1.803 m), weight 207 lb 3.2 oz (94 kg), SpO2 100 %.  Gen:  No acute distress Lungs:   ctab, no wheeze, pectus excavatum present CV:         RRR no mrg   Data Reviewed: Imaging: I have personally reviewed the chest xray today shows mild pectus excavatum. No acute process  PFTs:     Latest Ref Rng & Units 10/09/2020   10:55 AM 02/06/2017   10:57 AM  PFT Results  FVC-Pre L 3.20  3.57   FVC-Predicted Pre % 71  79   FVC-Post L 3.43  3.60   FVC-Predicted Post % 76  80   Pre FEV1/FVC % % 78  77   Post FEV1/FCV % % 81  81   FEV1-Pre L 2.51  2.75   FEV1-Predicted Pre % 70  75   FEV1-Post L 2.76  2.91   DLCO uncorrected ml/min/mmHg 24.38  23.87   DLCO UNC% % 92  73   DLCO corrected ml/min/mmHg 24.38  22.27   DLCO COR %Predicted % 92  68   DLVA Predicted % 122  80   TLC L 5.26  5.32   TLC % Predicted % 87  88   RV % Predicted % 89  76    I have personally reviewed the patient's PFTs and normal pulmonary function, but there  is a drop in FVC from 3.57 to 3.2 over the course of 4 years.   Labs: Lab Results  Component Value Date   WBC 16.0 (H) 05/24/2021   HGB 12.2 05/24/2021   HCT 36.5 05/24/2021   MCV 88.2 05/24/2021   PLT 467 (H) 05/24/2021   Lab Results  Component Value Date   NA 139 07/30/2021   K 4.6 07/30/2021   CL 103 07/30/2021   CO2 22 07/30/2021    Immunization status: Immunization History  Administered Date(s) Administered   Hepatitis B 05/30/1990   Influenza, Seasonal, Injecte, Preservative Fre 11/28/2016   Influenza,inj,Quad PF,6+ Mos 02/14/2015, 11/28/2016, 12/30/2017, 11/30/2018, 12/26/2020, 11/08/2021   Influenza,inj,quad, With Preservative 02/14/2015   Influenza-Unspecified 01/30/2015   MMR 05/30/1990   PFIZER(Purple Top)SARS-COV-2 Vaccination 06/16/2019, 07/07/2019, 12/23/2019, 06/29/2020, 12/02/2020   Pneumococcal-Unspecified 03/31/2013   Tdap 09/28/2013    External Records Personally Reviewed: OB, ED, Cardiology  Assessment:  Shortness of breath Pectus Excavatum A1AT heterozygote deficiency Possible asthma  Plan/Recommendations:  Get PFTs to monitor FVC - with BD response for possible asthma.   Start taking Breo 1 puff once daily, gargle after use (with alcohol based mouthwash.)  She has said albuterol has not helped in the past. Will hold off for now pending pft.  Return to Care: Return in about 3 months (around 10/31/2022) for next available PFT, follow up after.   Durel Salts, MD Pulmonary and Critical Care Medicine [facility] Office:[phone]

## 2022-07-31 NOTE — Patient Instructions (Addendum)
Please schedule follow up scheduled with myself in 3 months.  If my schedule is not open yet, we will contact you with a reminder closer to that time. Please call [phone] if you haven't heard from [place] a month before.   Get PFTs  Start taking Breo 1 puff once daily, gargle after use (with alcohol based mouthwash.)

## 2022-08-26 NOTE — Progress Notes (Signed)
Cardiology Office Note:    Date:  08/27/2022   ID:  CATTALEYA SA, DOB October 07, 1975, MRN [id number]  PCP:  Doreene Nest, NP   [facility] Providers Cardiologist:  Kristeen Miss, MD     Referring MD: Doreene Nest, NP   Chief Complaint: Familial hyperlipidemia  History of Present Illness:    Elaine [name] is a very pleasant 47 y.o. female with a hx of hyperlipidemia,  strong family history of premature coronary artery disease, pectus excavatum, septic diverticulitis s/p colectomy  Referred to cardiology and seen by Dr. Elease Hashimoto 04/05/2018.  She reported history of alpha 1 antitrypsin deficiency followed by pulmonology. Chronic shortness of breath. Coronary calcium score of 04/05/2018.  At office visit November 2022 she reported recent syncopal episode at 3 AM.  Woke up to use the bathroom, took perhaps 5 steps then remembers waking up on the floor.  Her Apple Watch showed heart rate dropped into the 40s.  Has orthostatic hypotension.  Hydrates well plus electrolytes at least once daily.  Cardiac monitor 02/2021 revealed 1 episode of NSVT lasting 6 beats, predominant NSR with average HR 84 bpm.  Last cardiology clinic visit was 07/30/2021 at which time she reported a stress fracture in her right hip from the episode of syncope.  Had a uterine ablation complicated by infection and bleeding.  Can walk for 30 minutes before she has hip pain.  Has pectus excavated him and scoliosis, seeing a decrease in her lung capacity.  Seeing orthopedics for concern for Ehlers-Danlos syndrome.  Today, she is here alone for follow-up. Feeling a little more fatigued lately. Reports continued problems with frequent tendon rupture and hypermobile joints. Unfortunately, she has a general feeling of discomfort which has not changed over the past several years. Often does not notice minor changes in her health status. Was late to get treatment when she had infection following the ablation. Did not complete work  up for Ehlers-Danlos syndrome because there is only 1 provider in the area that treats the disease, but physical therapist told her she was pretty certain she had it. Continues to have difficulty walking up inclines since hip fracture. 2-3% is max grade on treadmill. Lifts weights 3 days per week, walks outside when she can which she prefers over treadmill. Has done Pilates frequently, so flexibility is generally not an issue for her. PT has not really helped with her general discomfort. Continues to get lightheaded when changing positions too quickly, is careful with weight lifting.  Gets short of breath initially with exercise which she reports is chronic and stable. SOB has improved on Breo inhaler. No edema, orthopnea, PND, chest pain. Occasionally has palpitations associated with tachycardia. Also has bradycardia at times. Was a Producer, television/film/video, follows a very good diet with lots of fiber and protein.   Past Medical History:  Diagnosis Date   ADHD (attention deficit hyperactivity disorder)    Alpha-1-antitrypsin deficiency (HCC)    Arthritis    Asthma    Bulging lumbar disc    Diverticulitis    with sepsis   Diverticulosis    Family history of adverse reaction to anesthesia    malignant Hyperthermia- father and sister   Fibroids    Vaginal   GERD (gastroesophageal reflux disease)    Giantism (HCC)    No compelte work up, but suspected   Headache    Heart murmur    " Muscial " murmer -   History of hiatal hernia  Hyperlipidemia    Malignant hyperthermia    Orthostatic hypotension    Psoriasis    Ruptured ovarian cyst 2004,2016   Seizures (HCC)    none since age 56    Past Surgical History:  Procedure Laterality Date   BREAST BIOPSY Right 07/04/2022   [place] RT BREAST BX W LOC DEV 1ST LESION IMG BX SPEC US GUIDE 07/04/2022 [facility]   DENTAL SURGERY     DILITATION & CURRETTAGE/HYSTROSCOPY WITH HYDROTHERMAL ABLATION N/A 04/16/2021   Procedure: DILATATION &  CURETTAGE/HYSTEROSCOPY WITH HYDROTHERMAL ABLATION;  Surgeon: Reva Bores, MD;  Location: [facility] OR;  Service: Gynecology;  Laterality: N/A;  rep will be here confirmed on 04/08/21 [facility]   LAPAROSCOPIC SIGMOID COLECTOMY  2013    Current Medications: Current Meds  Medication Sig   betamethasone dipropionate 0.05 % lotion 1 liberally Topical Daily   Cholecalciferol (VITAMIN D3 PO) Take 1 capsule by mouth daily.   Ferrous Sulfate (IRON PO) Take by mouth.   fluticasone (FLONASE) 50 MCG/ACT nasal spray Place 2 sprays into both nostrils daily.   fluticasone furoate-vilanterol (BREO ELLIPTA) 100-25 MCG/ACT AEPB Inhale 1 puff into the lungs daily.   Galcanezumab-gnlm (EMGALITY) 120 MG/ML SOAJ Inject 120 mg into the skin every 30 (thirty) days.   MAGNESIUM PO Take 1 capsule by mouth daily.   rosuvastatin (CRESTOR) 5 MG tablet Take 1 tablet (5 mg total) by mouth daily.   vitamin B-12 (CYANOCOBALAMIN) 250 MCG tablet Take 250 mcg by mouth daily.   zolmitriptan (ZOMIG) 5 MG tablet Take 1 tablet by mouth at migraine onset, may repeat 2 hours later if migraine persists. Do not exceed 2 tablets in 24 hours.     Allergies:   Other, Cephalexin, and Penicillins   Social History   Socioeconomic History   Marital status: Married    Spouse name: Not on file   Number of children: Not on file   Years of education: Not on file   Highest education level: Not on file  Occupational History   Not on file  Tobacco Use   Smoking status: Former    Packs/day: 0.00    Years: 5.00    Additional pack years: 0.00    Total pack years: 0.00    Types: Cigarettes   Smokeless tobacco: Never  Vaping Use   Vaping Use: Never used  Substance and Sexual Activity   Alcohol use: Yes    Comment: ocassional- 1-2 drinks   Drug use: Never   Sexual activity: Yes  Other Topics Concern   Not on file  Social History Narrative   Married.   1 child.   Works as a Visual merchandiser.    Social Determinants of Health   Financial Resource  Strain: Not on file  Food Insecurity: No Food Insecurity (11/07/2021)   Hunger Vital Sign    Worried About Running Out of Food in the Last Year: Never true    Ran Out of Food in the Last Year: Never true  Transportation Needs: No Transportation Needs (11/07/2021)   PRAPARE - Administrator, Civil Service (Medical): No    Lack of Transportation (Non-Medical): No  Physical Activity: Not on file  Stress: Not on file  Social Connections: Not on file     Family History: The patient's family history includes Heart attack (age of onset: 36) in her father; Hyperlipidemia in her father; Prostate cancer in her father; Stroke (age of onset: 24) in her father. There is no history of Stomach cancer,  Colon polyps, Esophageal cancer, or Pancreatic cancer.  ROS:   Please see the history of present illness.    + chronic shortness of breath All other systems reviewed and are negative.  Labs/Other Studies Reviewed:    The following studies were reviewed today:  Cardiac Monitor 03/01/2021 Sinus rhythm including NSR, sinus bradycardia and sinus tachycardia Rare episodes of nonsustained VT- longest of 6 beats.  Echo 04/05/2019 1. Left ventricular ejection fraction, by visual estimation, is 60 to  65%. The left ventricle has normal function. There is no left ventricular  hypertrophy.   2. The left ventricle has no regional wall motion abnormalities.   3. Global right ventricle has normal systolic function.The right  ventricular size is normal. No increase in right ventricular wall  thickness.   4. Left atrial size was normal.   5. Right atrial size was normal.   6. The mitral valve is normal in structure. Mild mitral valve  regurgitation. No evidence of mitral stenosis.   7. The tricuspid valve is normal in structure.   8. The aortic valve is normal in structure. Aortic valve regurgitation is  not visualized. No evidence of aortic valve sclerosis or stenosis.   9. The pulmonic valve was  normal in structure. Pulmonic valve  regurgitation is not visualized.  10. The inferior vena cava is normal in size with greater than 50%  respiratory variability, suggesting right atrial pressure of 3 mmHg.  11. The average left ventricular global longitudinal strain is -20.3 %.  CT Cardiac Scoring 04/05/2018 IMPRESSION: Coronary calcium score of 0. This was 0 percentile for age and sex matched control.  Recent Labs: No results found for requested labs within last 365 days.  Recent Lipid Panel    Component Value Date/Time   CHOL 159 07/30/2021 1046   TRIG 107 07/30/2021 1046   HDL 44 07/30/2021 1046   CHOLHDL 3.6 07/30/2021 1046   CHOLHDL 5 08/09/2019 1143   VLDL 42.8 (H) 08/09/2019 1143   LDLCALC 95 07/30/2021 1046   LDLDIRECT 156.0 08/09/2019 1143     Risk Assessment/Calculations:           Physical Exam:    VS:  BP 118/86   Pulse 69   Ht 5\' 11"  (1.803 m)   Wt 210 lb 12.8 oz (95.6 kg)   SpO2 100%   BMI 29.40 kg/m     Wt Readings from Last 3 Encounters:  08/27/22 210 lb 12.8 oz (95.6 kg)  07/31/22 207 lb 3.2 oz (94 kg)  06/30/22 207 lb (93.9 kg)     GEN:  Well nourished, well developed in no acute distress HEENT: Normal NECK: No JVD; No carotid bruits CARDIAC: RRR, no murmurs, rubs, gallops RESPIRATORY:  Clear to auscultation without rales, wheezing or rhonchi  ABDOMEN: Soft, non-tender, non-distended MUSCULOSKELETAL:  No edema; No deformity. 2+ pedal pulses, equal bilaterally SKIN: Warm and dry NEUROLOGIC:  Alert and oriented x 3 PSYCHIATRIC:  Normal affect   EKG:  EKG is ordered today.  The ekg ordered today demonstrates normal sinus rhythm at 77 bpm, nonspecific ST abnormality      Diagnoses:    1. Familial hypercholesterolemia   2. Other fatigue   3. Orthostatic hypotension   4. Other specified hypothyroidism   5. Alpha-1-antitrypsin deficiency (HCC)   6. Shortness of breath    Assessment and Plan:     Fatigue: Feeling more fatigued  recently. Has chronic myalgias/joint pain and tendon ruptures thought to be secondary to Ehlers-Danlos syndrome although  she has not completed testing. Remains active with pilates, weight lifting and walking. No acute change in activity tolerance. Will get CBC, TSH, CMET in addition to lipid panel at lab appointment next week to r/o potential causes of fatigue including thyroid dysfunction, anemia, and electrolyte disturbance.   Hyperlipidemia: Likely familial hyperlipidemia. Goal LDL < 70. LDL 95 on 07/30/2021. We will check next week when she can return fasting. Discussed initiation of PCSK9i which she is agreeable if LDL remains elevated. Emphasized importance of primary prevention of ASCVD including 150 minutes moderate intensity exercise each week and heart healthy, mostly plant based diet.   Shortness of breath/Alpha 1 antitripsin deficiency: Chronic ShOB that she feels is stable, improved since she started [facility] inhaler. She remains active with no additional symptoms of orthopnea, PND, dyspnea or edema.   Orthostatic hypotension: Continues to have lightheadedness with position changes. Is careful to change position with caution. No presyncope, syncope.  BP is well-controlled today.     Disposition: 6 months with me  Medication Adjustments/Labs and Tests Ordered: Current medicines are reviewed at length with the patient today.  Concerns regarding medicines are outlined above.  Orders Placed This Encounter  Procedures   CBC   TSH   Comp Met (CMET)   Lipid Profile   EKG 12-Lead   No orders of the defined types were placed in this encounter.   Patient Instructions  Medication Instructions:   Your physician recommends that you continue on your current medications as directed. Please refer to the Current Medication list given to you today.   *If you need a refill on your cardiac medications before your next appointment, please call your pharmacy*   Lab Work:  Your physician  recommends that you return for a FASTING lipid profile/CMET/TSH/CBC on Tuesday, June 5. You can come in on the day of your appointment anytime between 7:30-4:30 fasting from midnight the night before.     If you have labs (blood work) drawn today and your tests are completely normal, you will receive your results only by: MyChart Message (if you have MyChart) OR A paper copy in the mail If you have any lab test that is abnormal or we need to change your treatment, we will call you to review the results.   Testing/Procedures:  None ordered.    Follow-Up: At [facility], you and your health needs are our priority.  As part of our continuing mission to provide you with exceptional heart care, we have created designated Provider Care Teams.  These Care Teams include your primary Cardiologist (physician) and Advanced Practice Providers (APPs -  Physician Assistants and Nurse Practitioners) who all work together to provide you with the care you need, when you need it.  We recommend signing up for the patient portal called "MyChart".  Sign up information is provided on this After Visit Summary.  MyChart is used to connect with patients for Virtual Visits (Telemedicine).  Patients are able to view lab/test results, encounter notes, upcoming appointments, etc.  Non-urgent messages can be sent to your provider as well.   To learn more about what you can do with MyChart, go to [email or web address].    Your next appointment:   6 month(s)  Provider:   Eligha Bridegroom, NP         Other Instructions  Your physician wants you to follow-up in: 6 months with Lebron Conners. You will receive a reminder letter in the mail two months in advance. If you don't receive a  letter, please call our office to schedule the follow-up appointment.     Signed, Elaine Aland, NP  08/27/2022 12:31 PM    [facility]

## 2022-08-27 ENCOUNTER — Encounter: Payer: Self-pay | Admitting: Nurse Practitioner

## 2022-08-27 ENCOUNTER — Ambulatory Visit: Payer: 59 | Attending: Nurse Practitioner | Admitting: Nurse Practitioner

## 2022-08-27 VITALS — BP 118/86 | HR 69 | Ht 71.0 in | Wt 210.8 lb

## 2022-08-27 DIAGNOSIS — R0602 Shortness of breath: Secondary | ICD-10-CM

## 2022-08-27 DIAGNOSIS — E038 Other specified hypothyroidism: Secondary | ICD-10-CM

## 2022-08-27 DIAGNOSIS — E8801 Alpha-1-antitrypsin deficiency: Secondary | ICD-10-CM

## 2022-08-27 DIAGNOSIS — R5383 Other fatigue: Secondary | ICD-10-CM

## 2022-08-27 DIAGNOSIS — I951 Orthostatic hypotension: Secondary | ICD-10-CM

## 2022-08-27 DIAGNOSIS — E7801 Familial hypercholesterolemia: Secondary | ICD-10-CM | POA: Diagnosis not present

## 2022-08-27 NOTE — Patient Instructions (Signed)
Medication Instructions:   Your physician recommends that you continue on your current medications as directed. Please refer to the Current Medication list given to you today.   *If you need a refill on your cardiac medications before your next appointment, please call your pharmacy*   Lab Work:  Your physician recommends that you return for a FASTING lipid profile/CMET/TSH/CBC on Tuesday, June 5. You can come in on the day of your appointment anytime between 7:30-4:30 fasting from midnight the night before.     If you have labs (blood work) drawn today and your tests are completely normal, you will receive your results only by: MyChart Message (if you have MyChart) OR A paper copy in the mail If you have any lab test that is abnormal or we need to change your treatment, we will call you to review the results.   Testing/Procedures:  None ordered.    Follow-Up: At [facility], you and your health needs are our priority.  As part of our continuing mission to provide you with exceptional heart care, we have created designated Provider Care Teams.  These Care Teams include your primary Cardiologist (physician) and Advanced Practice Providers (APPs -  Physician Assistants and Nurse Practitioners) who all work together to provide you with the care you need, when you need it.  We recommend signing up for the patient portal called "MyChart".  Sign up information is provided on this After Visit Summary.  MyChart is used to connect with patients for Virtual Visits (Telemedicine).  Patients are able to view lab/test results, encounter notes, upcoming appointments, etc.  Non-urgent messages can be sent to your provider as well.   To learn more about what you can do with MyChart, go to [email or web address].    Your next appointment:   6 month(s)  Provider:   Eligha Bridegroom, NP         Other Instructions  Your physician wants you to follow-up in: 6 months with Lebron Conners. You will receive a reminder letter in the mail two months in advance. If you don't receive a letter, please call our office to schedule the follow-up appointment.

## 2022-09-02 ENCOUNTER — Ambulatory Visit: Payer: 59

## 2022-09-03 ENCOUNTER — Ambulatory Visit (INDEPENDENT_AMBULATORY_CARE_PROVIDER_SITE_OTHER): Payer: 59 | Admitting: Primary Care

## 2022-09-03 ENCOUNTER — Encounter: Payer: Self-pay | Admitting: Primary Care

## 2022-09-03 ENCOUNTER — Telehealth: Payer: Self-pay | Admitting: Gastroenterology

## 2022-09-03 VITALS — BP 120/76 | HR 95 | Temp 97.5°F | Ht 71.0 in | Wt 207.0 lb

## 2022-09-03 DIAGNOSIS — G43109 Migraine with aura, not intractable, without status migrainosus: Secondary | ICD-10-CM

## 2022-09-03 DIAGNOSIS — E039 Hypothyroidism, unspecified: Secondary | ICD-10-CM

## 2022-09-03 DIAGNOSIS — K219 Gastro-esophageal reflux disease without esophagitis: Secondary | ICD-10-CM | POA: Diagnosis not present

## 2022-09-03 DIAGNOSIS — K5909 Other constipation: Secondary | ICD-10-CM

## 2022-09-03 DIAGNOSIS — Z0000 Encounter for general adult medical examination without abnormal findings: Secondary | ICD-10-CM

## 2022-09-03 DIAGNOSIS — G8929 Other chronic pain: Secondary | ICD-10-CM | POA: Insufficient documentation

## 2022-09-03 DIAGNOSIS — Z8249 Family history of ischemic heart disease and other diseases of the circulatory system: Secondary | ICD-10-CM

## 2022-09-03 DIAGNOSIS — E8801 Alpha-1-antitrypsin deficiency: Secondary | ICD-10-CM

## 2022-09-03 DIAGNOSIS — M542 Cervicalgia: Secondary | ICD-10-CM

## 2022-09-03 DIAGNOSIS — M545 Low back pain, unspecified: Secondary | ICD-10-CM

## 2022-09-03 DIAGNOSIS — R5383 Other fatigue: Secondary | ICD-10-CM

## 2022-09-03 NOTE — Assessment & Plan Note (Addendum)
Following with pulmonology, reviewed office notes from May 2024.  Continue Breo 100-25 mcg, 1 puff daily.

## 2022-09-03 NOTE — Telephone Encounter (Signed)
Left message for pt to call back  °

## 2022-09-03 NOTE — Assessment & Plan Note (Signed)
Repeat TSH pending. Not currently managed on treatment. Await results.

## 2022-09-03 NOTE — Assessment & Plan Note (Signed)
Following with neurology. Doing well.  Continue Emgality 120 mg every 30 days. Continue Zomig 5 mg PRN.

## 2022-09-03 NOTE — Assessment & Plan Note (Signed)
Chronic, ongoing.   Managing with exercise and stretching. Continue to monitor.

## 2022-09-03 NOTE — Assessment & Plan Note (Addendum)
Immunizations UTD. Pap smear UTD. Mammogram UTD, due again in October 2024 Colonoscopy due, she will call her GI provider to schedule.    Exam stable. Labs pending per cardiology  Follow up in 1 year for repeat physical.

## 2022-09-03 NOTE — Assessment & Plan Note (Signed)
Controlled.  Continue to monitor.  

## 2022-09-03 NOTE — Assessment & Plan Note (Signed)
Following with cardiology, office notes reviewed from May 2024. Continue rosuvastatin 5 mg daily.  Labs pending per cardiology.

## 2022-09-03 NOTE — Assessment & Plan Note (Signed)
Ongoing.  Evaluated by cardiology, office notes reviewed from last week. Labs pending per cardiology.

## 2022-09-03 NOTE — Assessment & Plan Note (Signed)
Following with neurology. MRI brain and C-spine reviewed from December 2023.

## 2022-09-03 NOTE — Assessment & Plan Note (Signed)
Ongoing.  Continue probiotic. Discussed use of Miralax, Benefiber, Metamucil.

## 2022-09-03 NOTE — Telephone Encounter (Signed)
Patient called to schedule colonoscopy. States she has Malignant hyperthermia. She would like a call back to discuss how to proceed with scheduling. Please advise, thank you.

## 2022-09-03 NOTE — Progress Notes (Signed)
Subjective:    Patient ID: Elaine [name], female    DOB: May 18, 1975, 47 y.o.   MRN: [id number]  HPI  Elaine [name] is a very pleasant 47 y.o. female who presents today for complete physical and follow up of chronic conditions.  Immunizations: -Tetanus: Completed in 2015  Diet: Healthy diet.  Exercise: Regular exercise, 3 days weekly at the gym.   Eye exam: Completes annually  Dental exam: Completes semi-annually    Pap Smear: UTD, follows with GYN Mammogram: March 2024, due again in October 2024.  Colonoscopy: Never completed. She will call to get scheduled.    BP Readings from Last 3 Encounters:  09/03/22 120/76  08/27/22 118/86  07/31/22 116/86      Review of Systems  Constitutional:  Negative for unexpected weight change.  HENT:  Positive for congestion, postnasal drip and rhinorrhea.   Respiratory:  Negative for cough and shortness of breath.   Cardiovascular:  Negative for chest pain.  Gastrointestinal:  Positive for constipation. Negative for diarrhea.  Genitourinary:  Negative for difficulty urinating.  Musculoskeletal:  Positive for arthralgias and myalgias.  Skin:  Negative for rash.  Allergic/Immunologic: Positive for environmental allergies.  Neurological:  Negative for dizziness and headaches.  Psychiatric/Behavioral:  The patient is not nervous/anxious.          Past Medical History:  Diagnosis Date   Abnormal uterine bleeding 12/27/2020   ADHD (attention deficit hyperactivity disorder)    Alpha-1-antitrypsin deficiency (HCC)    Arthritis    Asthma    Bulging lumbar disc    Diverticulitis    with sepsis   Diverticulosis    Endometritis 05/19/2021   Family history of adverse reaction to anesthesia    malignant Hyperthermia- father and sister   Fibroids    Vaginal   GERD (gastroesophageal reflux disease)    Giantism (HCC)    No compelte work up, but suspected   Headache    Heart murmur    " Muscial " murmer -   History of hiatal  hernia    Hyperlipidemia    Malignant hyperthermia    Orthostatic hypotension    Psoriasis    Ruptured ovarian cyst 2004,2016   Seizures (HCC)    none since age 84    Social History   Socioeconomic History   Marital status: Married    Spouse name: Not on file   Number of children: Not on file   Years of education: Not on file   Highest education level: Not on file  Occupational History   Not on file  Tobacco Use   Smoking status: Former    Packs/day: 0.00    Years: 5.00    Additional pack years: 0.00    Total pack years: 0.00    Types: Cigarettes   Smokeless tobacco: Never  Vaping Use   Vaping Use: Never used  Substance and Sexual Activity   Alcohol use: Yes    Comment: ocassional- 1-2 drinks   Drug use: Never   Sexual activity: Yes  Other Topics Concern   Not on file  Social History Narrative   Married.   1 child.   Works as a Visual merchandiser.    Social Determinants of Health   Financial Resource Strain: Not on file  Food Insecurity: No Food Insecurity (11/07/2021)   Hunger Vital Sign    Worried About Running Out of Food in the Last Year: Never true    Ran Out of Food in the Last Year: Never  true  Transportation Needs: No Transportation Needs (11/07/2021)   PRAPARE - Administrator, Civil Service (Medical): No    Lack of Transportation (Non-Medical): No  Physical Activity: Not on file  Stress: Not on file  Social Connections: Not on file  Intimate Partner Violence: Not on file    Past Surgical History:  Procedure Laterality Date   BREAST BIOPSY Right 07/04/2022   [place] RT BREAST BX W LOC DEV 1ST LESION IMG BX SPEC US GUIDE 07/04/2022 [facility]   DENTAL SURGERY     DILITATION & CURRETTAGE/HYSTROSCOPY WITH HYDROTHERMAL ABLATION N/A 04/16/2021   Procedure: DILATATION & CURETTAGE/HYSTEROSCOPY WITH HYDROTHERMAL ABLATION;  Surgeon: Reva Bores, MD;  Location: [facility];  Service: Gynecology;  Laterality: N/A;  rep will be here confirmed on 04/08/21 CS    LAPAROSCOPIC SIGMOID COLECTOMY  2013    Family History  Problem Relation Age of Onset   Stroke Father 21   Heart attack Father 77   Hyperlipidemia Father    Prostate cancer Father    Stomach cancer Neg Hx    Colon polyps Neg Hx    Esophageal cancer Neg Hx    Pancreatic cancer Neg Hx     Allergies  Allergen Reactions   Other Nausea And Vomiting    Opioids   Cephalexin Other (See Comments)    Blisters in mouth    Penicillins Rash    Hasn't had since childhood Itching with Zosyn 05/2021    Current Outpatient Medications on File Prior to Visit  Medication Sig Dispense Refill   betamethasone dipropionate 0.05 % lotion 1 liberally Topical Daily     Cholecalciferol (VITAMIN D3 PO) Take 1 capsule by mouth daily.     cyanocobalamin (VITAMIN B12) 500 MCG tablet Take by mouth.     Ferrous Sulfate (IRON PO) Take by mouth.     fluticasone (FLONASE) 50 MCG/ACT nasal spray Place 2 sprays into both nostrils daily.     fluticasone furoate-vilanterol (BREO ELLIPTA) 100-25 MCG/ACT AEPB Inhale 1 puff into the lungs daily. 1 each 3   Galcanezumab-gnlm (EMGALITY) 120 MG/ML SOAJ Inject 120 mg into the skin every 30 (thirty) days. 1.12 mL 11   MAGNESIUM PO Take 1 capsule by mouth daily.     rosuvastatin (CRESTOR) 5 MG tablet Take 1 tablet (5 mg total) by mouth daily. 90 tablet 0   vitamin B-12 (CYANOCOBALAMIN) 250 MCG tablet Take 250 mcg by mouth daily.     zolmitriptan (ZOMIG) 5 MG tablet Take 1 tablet by mouth at migraine onset, may repeat 2 hours later if migraine persists. Do not exceed 2 tablets in 24 hours. 10 tablet 11   No current facility-administered medications on file prior to visit.    BP 120/76   Pulse 95   Temp (!) 97.5 F (36.4 C) (Temporal)   Ht 5\' 11"  (1.803 m)   Wt 207 lb (93.9 kg)   SpO2 98%   BMI 28.87 kg/m  Objective:   Physical Exam HENT:     Right Ear: Tympanic membrane and ear canal normal.     Left Ear: Tympanic membrane and ear canal normal.     Nose: Nose  normal.  Eyes:     Conjunctiva/sclera: Conjunctivae normal.     Pupils: Pupils are equal, round, and reactive to light.  Neck:     Thyroid: No thyromegaly.  Cardiovascular:     Rate and Rhythm: Normal rate and regular rhythm.     Heart sounds: No murmur  heard. Pulmonary:     Effort: Pulmonary effort is normal.     Breath sounds: Normal breath sounds. No rales.  Abdominal:     General: Bowel sounds are normal.     Palpations: Abdomen is soft.     Tenderness: There is no abdominal tenderness.  Musculoskeletal:        General: Normal range of motion.     Cervical back: Neck supple.  Lymphadenopathy:     Cervical: No cervical adenopathy.  Skin:    General: Skin is warm and dry.     Findings: No rash.  Neurological:     Mental Status: She is alert and oriented to person, place, and time.     Cranial Nerves: No cranial nerve deficit.     Deep Tendon Reflexes: Reflexes are normal and symmetric.  Psychiatric:        Mood and Affect: Mood normal.           Assessment & Plan:  Alpha-1-antitrypsin deficiency [facility]) Assessment & Plan: Following with pulmonology, reviewed office notes from May 2024.  Continue Breo 100-25 mcg, 1 puff daily.   Migraine with aura and without status migrainosus, not intractable Assessment & Plan: Following with neurology. Doing well.  Continue Emgality 120 mg every 30 days. Continue Zomig 5 mg PRN.   Gastroesophageal reflux disease, unspecified whether esophagitis present Assessment & Plan: Controlled.  Continue to monitor.    Preventative health care Assessment & Plan: Immunizations UTD. Pap smear UTD. Mammogram UTD, due again in October 2024 Colonoscopy due, she will call her GI provider to schedule.    Exam stable. Labs pending per cardiology  Follow up in 1 year for repeat physical.    Fatigue, unspecified type Assessment & Plan: Ongoing.  Evaluated by cardiology, office notes reviewed from last week. Labs pending  per cardiology.   Chronic constipation Assessment & Plan: Ongoing.  Continue probiotic. Discussed use of Miralax, Benefiber, Metamucil.    Hypothyroidism, unspecified type Assessment & Plan: Repeat TSH pending. Not currently managed on treatment. Await results.    Chronic low back pain, unspecified back pain laterality, unspecified whether sciatica present Assessment & Plan: Chronic, ongoing.   Managing with exercise and stretching. Continue to monitor.    Chronic neck pain Assessment & Plan: Following with neurology. MRI brain and C-spine reviewed from December 2023.     Family history of cardiovascular disease Assessment & Plan: Following with cardiology, office notes reviewed from May 2024. Continue rosuvastatin 5 mg daily.  Labs pending per cardiology.         Doreene Nest, NP

## 2022-09-03 NOTE — Patient Instructions (Signed)
Call your GI doctor to schedule your colonoscopy.  It was a pleasure to see you today!

## 2022-09-04 NOTE — Telephone Encounter (Signed)
Pt is supposed to have screening colonoscopy. Pt has malignant hyperthermia and wanted to know if she needed to have the procedure at the hospital. I told her I thought she would have to be done at the hospital. Please advise.

## 2022-09-10 ENCOUNTER — Other Ambulatory Visit: Payer: Self-pay

## 2022-09-10 ENCOUNTER — Ambulatory Visit: Payer: 59

## 2022-09-10 DIAGNOSIS — Z1211 Encounter for screening for malignant neoplasm of colon: Secondary | ICD-10-CM

## 2022-09-10 NOTE — Telephone Encounter (Signed)
Pt scheduled for telephone previsit 11/03/22 at 11am. Colon scheduled at [facility] 11/25/22 at 8:30am, pt to arrive there at 7am. [id number]. Left detailed message for pt on her voice mail and also sent via mychart.

## 2022-09-18 ENCOUNTER — Telehealth: Payer: Self-pay

## 2022-09-18 NOTE — Telephone Encounter (Signed)
Spoke with pt and rescheduled OV until after PFT would be completed. Pt is requesting a cheaper inhaler then Breo. Pt says Virgel Bouquet works but is to expensive. Pt would like to switch to inhaler with coupon. Dr. Celine Mans please advise.

## 2022-09-19 ENCOUNTER — Ambulatory Visit: Payer: 59 | Admitting: Internal Medicine

## 2022-09-19 ENCOUNTER — Encounter: Payer: Self-pay | Admitting: Internal Medicine

## 2022-09-19 ENCOUNTER — Other Ambulatory Visit (HOSPITAL_COMMUNITY): Payer: Self-pay

## 2022-09-19 MED ORDER — FLUTICASONE-SALMETEROL 250-50 MCG/ACT IN AEPB: 1.0000 | INHALATION_SPRAY | Freq: Two times a day (BID) | RESPIRATORY_TRACT | 5 refills | Status: DC

## 2022-09-19 NOTE — Telephone Encounter (Signed)
I have called and spoke with Elaine [name], Elaine [name] states she can not advair. It caused her bone pain in the past. Dr.Desai please advise.

## 2022-09-19 NOTE — Telephone Encounter (Signed)
Please ask her to call her insurance company to see what the covered alternative would be. Currently they are covering advair or wixela.

## 2022-09-19 NOTE — Telephone Encounter (Signed)
Dr. Desai please advise.  

## 2022-09-19 NOTE — Telephone Encounter (Signed)
Generic Advair Diskus/Wixela covered for a $15.00 co-pay at this time per test claims.

## 2022-09-19 NOTE — Telephone Encounter (Signed)
Please perform BIV for patient for ICS-LABA

## 2022-09-19 NOTE — Telephone Encounter (Signed)
Spoke with patient. Advised she contact insurance for preferred alternative since she is unable to take Advair. I advised patient to contact the office next week. Closing encounter for now

## 2022-09-22 NOTE — Telephone Encounter (Signed)
I have received the following mychart message from patient  " Dr Celine Mans, I spoke with my insurance company.the CSR & I spent 37 minutes trying to come up with a medication. The wixela (sp) is covered but is a generic to Advair. As per our prior discussions Advair causes me extreme bone pain. Looking into that today through Dr Waverly Ferrari it now comes up that it is a common known side effect.    We then started trying to come up with something else. She (the csr) & I  both do not know nor understand Asthma medications. However we went by ingredients. The best we could come up with was Trelegy. It is covered by my insurance & it does have a coupon program. Please let me know if this is an appropriate medication for me.   Thank you"  Dr.Desai please advise on your recommendations. Thanks !

## 2022-09-23 MED ORDER — TRELEGY ELLIPTA 200-62.5-25 MCG/ACT IN AEPB: 1.0000 | INHALATION_SPRAY | Freq: Every day | RESPIRATORY_TRACT | 5 refills | Status: DC

## 2022-09-24 ENCOUNTER — Telehealth: Payer: Self-pay | Admitting: *Deleted

## 2022-09-24 ENCOUNTER — Ambulatory Visit: Payer: 59 | Admitting: Primary Care

## 2022-09-24 VITALS — BP 114/82 | HR 76 | Temp 97.9°F | Ht 71.0 in | Wt 208.0 lb

## 2022-09-24 DIAGNOSIS — J029 Acute pharyngitis, unspecified: Secondary | ICD-10-CM | POA: Insufficient documentation

## 2022-09-24 DIAGNOSIS — E039 Hypothyroidism, unspecified: Secondary | ICD-10-CM | POA: Diagnosis not present

## 2022-09-24 LAB — COMPREHENSIVE METABOLIC PANEL
ALT: 16 U/L (ref 0–35)
AST: 17 U/L (ref 0–37)
Albumin: 4.6 g/dL (ref 3.5–5.2)
Alkaline Phosphatase: 68 U/L (ref 39–117)
BUN: 20 mg/dL (ref 6–23)
CO2: 27 mEq/L (ref 19–32)
Calcium: 10.1 mg/dL (ref 8.4–10.5)
Chloride: 102 mEq/L (ref 96–112)
Creatinine, Ser: 0.8 mg/dL (ref 0.40–1.20)
GFR: 88.66 mL/min (ref >60.00)
Glucose, Bld: 94 mg/dL (ref 70–99)
Potassium: 4.9 mEq/L (ref 3.5–5.1)
Sodium: 137 mEq/L (ref 135–145)
Total Bilirubin: 0.7 mg/dL (ref 0.2–1.2)
Total Protein: 7 g/dL (ref 6.0–8.3)

## 2022-09-24 LAB — CBC WITH DIFFERENTIAL/PLATELET
Basophils Absolute: 0 10*3/uL (ref 0.0–0.1)
Basophils Relative: 0.2 % (ref 0.0–3.0)
Eosinophils Absolute: 0.2 10*3/uL (ref 0.0–0.7)
Eosinophils Relative: 1.2 % (ref 0.0–5.0)
HCT: 43 % (ref 36.0–46.0)
Hemoglobin: 14.4 g/dL (ref 12.0–15.0)
Lymphocytes Relative: 20.6 % (ref 12.0–46.0)
Lymphs Abs: 3 10*3/uL (ref 0.7–4.0)
MCHC: 33.5 g/dL (ref 30.0–36.0)
MCV: 92.9 fl (ref 78.0–100.0)
Monocytes Absolute: 0.8 10*3/uL (ref 0.1–1.0)
Monocytes Relative: 5.7 % (ref 3.0–12.0)
Neutro Abs: 10.5 10*3/uL — ABNORMAL HIGH (ref 1.4–7.7)
Neutrophils Relative %: 72.3 % (ref 43.0–77.0)
Platelets: 344 10*3/uL (ref 150.0–400.0)
RBC: 4.63 Mil/uL (ref 3.87–5.11)
RDW: 13.2 % (ref 11.5–15.5)
WBC: 14.5 10*3/uL — ABNORMAL HIGH (ref 4.0–10.5)

## 2022-09-24 LAB — MONONUCLEOSIS SCREEN: Mono Screen: NEGATIVE

## 2022-09-24 LAB — TSH: TSH: 1.38 u[IU]/mL (ref 0.35–5.50)

## 2022-09-24 NOTE — Patient Instructions (Signed)
Stop by the lab prior to leaving today. I will notify you of your results once received.   Consider starting famotidine (Pepcid) 20 to 40 mg at bedtime for sore throat and cough.  I am drawing all the labs that you need for your cardiology appointment except for the lipid panel.  It was a pleasure to see you today!

## 2022-09-24 NOTE — Assessment & Plan Note (Addendum)
Unclear etiology. [facility] through care everywhere.  Differentials include viral/bacterial cause, GERD, postnasal drip, inhaled corticosteroid/LABA.  Throat culture ordered and pending today. Mononucleosis labs ordered and pending. Checking CBC and CMP.

## 2022-09-24 NOTE — Progress Notes (Signed)
Subjective:    Patient ID: Elaine [name], female    DOB: 06-11-1975, 47 y.o.   MRN: [id number]  Sore Throat  Associated symptoms include coughing and headaches. Pertinent negatives include no congestion.    Elaine [name] is a very pleasant 47 y.o. female with a history of GERD, hypothyroidism, migraines, alpha 1 antitrypsin deficiency who presents today to discuss sore throat.  [facility] for a 1 day history of sore throat with body aches, chills, nasal congestion, fatigue, headaches, swollen glands.  Rapid strep test was negative.  She was treated with Mucinex and other conservative treatment.  [facility]2022 for ongoing symptoms which now included ear pain, postnasal drip, shortness of breath, sinus pressure along with her prior symptoms.  She was diagnosed with acute sinusitis and treated with azithromycin course, penicillin allergy and cannot tolerate doxycycline.  Today she continues to experience a sore throat, mild cough and post nasal drip, and headaches.  She was initiated on Breo inhaler in May 2024 per pulmonology.   She denies esophageal burning, fevers. She's been taking Tylenol with improvement in headaches. She has not tested for Covid-19 infection.    Review of Systems  Constitutional:  Positive for chills and fatigue.  HENT:  Positive for postnasal drip and sore throat. Negative for congestion, sinus pressure and sinus pain.   Respiratory:  Positive for cough.   Neurological:  Positive for headaches.         Past Medical History:  Diagnosis Date   Abnormal uterine bleeding 12/27/2020   ADHD (attention deficit hyperactivity disorder)    Alpha-1-antitrypsin deficiency (HCC)    Arthritis    Asthma    Bulging lumbar disc    Diverticulitis    with sepsis   Diverticulosis    Endometritis 05/19/2021   Family history of adverse reaction to anesthesia    malignant Hyperthermia- father and sister    Fibroids    Vaginal   GERD (gastroesophageal reflux disease)    Giantism (HCC)    No compelte work up, but suspected   Headache    Heart murmur    " Muscial " murmer -   History of hiatal hernia    Hyperlipidemia    Malignant hyperthermia    Orthostatic hypotension    Psoriasis    Ruptured ovarian cyst 2004,2016   Seizures (HCC)    none since age 55    Social History   Socioeconomic History   Marital status: Married    Spouse name: Not on file   Number of children: Not on file   Years of education: Not on file   Highest education level: 12th grade  Occupational History   Not on file  Tobacco Use   Smoking status: Former    Packs/day: 0.00    Years: 5.00    Additional pack years: 0.00    Total pack years: 0.00    Types: Cigarettes   Smokeless tobacco: Never  Vaping Use   Vaping Use: Never used  Substance and Sexual Activity   Alcohol use: Yes    Comment: ocassional- 1-2 drinks   Drug use: Never   Sexual activity: Yes  Other Topics Concern   Not on file  Social History Narrative   Married.   1 child.   Works as a Visual merchandiser.    Social Determinants of Health   Financial Resource Strain: Low Risk  (09/24/2022)   Overall Financial Resource Strain (CARDIA)  Difficulty of Paying Living Expenses: Not hard at all  Food Insecurity: No Food Insecurity (09/24/2022)   Hunger Vital Sign    Worried About Running Out of Food in the Last Year: Never true    Ran Out of Food in the Last Year: Never true  Transportation Needs: No Transportation Needs (09/24/2022)   PRAPARE - Administrator, Civil Service (Medical): No    Lack of Transportation (Non-Medical): No  Physical Activity: Sufficiently Active (09/24/2022)   Exercise Vital Sign    Days of Exercise per Week: 4 days    Minutes of Exercise per Session: 50 min  Stress: No Stress Concern Present (09/24/2022)   [employer] of Occupational Health - Occupational Stress Questionnaire    Feeling of Stress :  Only a little  Social Connections: Moderately Isolated (09/24/2022)   Social Connection and Isolation Panel [NHANES]    Frequency of Communication with Friends and Family: More than three times a week    Frequency of Social Gatherings with Friends and Family: More than three times a week    Attends Religious Services: Never    Database administrator or Organizations: No    Attends Engineer, structural: Not on file    Marital Status: Married  Catering manager Violence: Not on file    Past Surgical History:  Procedure Laterality Date   BREAST BIOPSY Right 07/04/2022   [place] RT BREAST BX W LOC DEV 1ST LESION IMG BX SPEC US GUIDE 07/04/2022 [facility]   DENTAL SURGERY     DILITATION & CURRETTAGE/HYSTROSCOPY WITH HYDROTHERMAL ABLATION N/A 04/16/2021   Procedure: DILATATION & CURETTAGE/HYSTEROSCOPY WITH HYDROTHERMAL ABLATION;  Surgeon: Reva Bores, MD;  Location: [facility];  Service: Gynecology;  Laterality: N/A;  rep will be here confirmed on 04/08/21 CS   LAPAROSCOPIC SIGMOID COLECTOMY  2013    Family History  Problem Relation Age of Onset   Stroke Father 74   Heart attack Father 27   Hyperlipidemia Father    Prostate cancer Father    Stomach cancer Neg Hx    Colon polyps Neg Hx    Esophageal cancer Neg Hx    Pancreatic cancer Neg Hx     Allergies  Allergen Reactions   Other Nausea And Vomiting    Opioids   Cephalexin Other (See Comments)    Blisters in mouth    Penicillins Rash    Hasn't had since childhood Itching with Zosyn 05/2021    Current Outpatient Medications on File Prior to Visit  Medication Sig Dispense Refill   betamethasone dipropionate 0.05 % lotion 1 liberally Topical Daily     Cholecalciferol (VITAMIN D3 PO) Take 1 capsule by mouth daily.     cyanocobalamin (VITAMIN B12) 500 MCG tablet Take by mouth.     fluticasone (FLONASE) 50 MCG/ACT nasal spray Place 2 sprays into both nostrils daily.     Fluticasone-Umeclidin-Vilant (TRELEGY ELLIPTA)  200-62.5-25 MCG/ACT AEPB Inhale 1 puff into the lungs daily. 1 each 5   Galcanezumab-gnlm (EMGALITY) 120 MG/ML SOAJ Inject 120 mg into the skin every 30 (thirty) days. 1.12 mL 11   MAGNESIUM PO Take 1 capsule by mouth daily.     rosuvastatin (CRESTOR) 5 MG tablet Take 1 tablet (5 mg total) by mouth daily. 90 tablet 0   zolmitriptan (ZOMIG) 5 MG tablet Take 1 tablet by mouth at migraine onset, may repeat 2 hours later if migraine persists. Do not exceed 2 tablets in 24 hours. 10 tablet 11  Ferrous Sulfate (IRON PO) Take by mouth.     vitamin B-12 (CYANOCOBALAMIN) 250 MCG tablet Take 250 mcg by mouth daily.     No current facility-administered medications on file prior to visit.    BP 114/82   Pulse 76   Temp 97.9 F (36.6 C) (Temporal)   Ht 5\' 11"  (1.803 m)   Wt 208 lb (94.3 kg)   SpO2 99%   BMI 29.01 kg/m  Objective:   Physical Exam HENT:     Right Ear: Tympanic membrane and ear canal normal.     Left Ear: Tympanic membrane and ear canal normal.     Nose:     Right Sinus: No maxillary sinus tenderness or frontal sinus tenderness.     Left Sinus: No maxillary sinus tenderness or frontal sinus tenderness.     Mouth/Throat:     Pharynx: No posterior oropharyngeal erythema.  Eyes:     Conjunctiva/sclera: Conjunctivae normal.  Cardiovascular:     Rate and Rhythm: Normal rate and regular rhythm.  Pulmonary:     Effort: Pulmonary effort is normal.     Breath sounds: Normal breath sounds. No wheezing or rales.     Comments: Dry, bronchospasm type cough noted during exam several times. Musculoskeletal:     Cervical back: Neck supple.  Lymphadenopathy:     Cervical: No cervical adenopathy.  Skin:    General: Skin is warm and dry.           Assessment & Plan:  Sore throat Assessment & Plan: Unclear etiology. [facility].  Differentials include viral/bacterial cause, GERD, postnasal drip, inhaled  corticosteroid/LABA.  Throat culture ordered and pending today. Mononucleosis labs ordered and pending. Checking CBC and CMP.  Orders: -     Culture, Group A Strep -     CBC with Differential/Platelet -     Mononucleosis screen -     Comprehensive metabolic panel  Hypothyroidism, unspecified type -     TSH        Doreene Nest, NP

## 2022-09-24 NOTE — Telephone Encounter (Signed)
S/w pt to make 6 month f/u with Lebron Conners. Pt was driving will send mychart message with time and date.

## 2022-09-25 ENCOUNTER — Ambulatory Visit: Payer: 59 | Attending: Nurse Practitioner

## 2022-09-25 ENCOUNTER — Ambulatory Visit: Payer: 59

## 2022-09-25 DIAGNOSIS — D72829 Elevated white blood cell count, unspecified: Secondary | ICD-10-CM

## 2022-09-25 DIAGNOSIS — E7801 Familial hypercholesterolemia: Secondary | ICD-10-CM

## 2022-09-25 DIAGNOSIS — E039 Hypothyroidism, unspecified: Secondary | ICD-10-CM

## 2022-09-25 DIAGNOSIS — R5383 Other fatigue: Secondary | ICD-10-CM

## 2022-09-25 DIAGNOSIS — E038 Other specified hypothyroidism: Secondary | ICD-10-CM

## 2022-09-25 DIAGNOSIS — I951 Orthostatic hypotension: Secondary | ICD-10-CM

## 2022-09-25 NOTE — Addendum Note (Signed)
Addended by: Clearnce Sorrel on: 09/25/2022 03:55 PM   Modules accepted: Orders

## 2022-09-26 ENCOUNTER — Telehealth: Payer: Self-pay | Admitting: *Deleted

## 2022-09-26 ENCOUNTER — Other Ambulatory Visit: Payer: Self-pay | Admitting: Primary Care

## 2022-09-26 DIAGNOSIS — R7989 Other specified abnormal findings of blood chemistry: Secondary | ICD-10-CM

## 2022-09-26 LAB — CULTURE, GROUP A STREP
MICRO NUMBER:: 15130407
SPECIMEN QUALITY:: ADEQUATE

## 2022-09-26 LAB — PATHOLOGIST SMEAR REVIEW

## 2022-09-26 LAB — LIPID PANEL
Chol/HDL Ratio: 3.4 ratio (ref 0.0–4.4)
Cholesterol, Total: 189 mg/dL (ref 100–199)
HDL: 56 mg/dL (ref >39)
LDL Chol Calc (NIH): 111 mg/dL — ABNORMAL HIGH (ref 0–99)
Triglycerides: 123 mg/dL (ref 0–149)
VLDL Cholesterol Cal: 22 mg/dL (ref 5–40)

## 2022-09-26 MED ORDER — ROSUVASTATIN CALCIUM 5 MG PO TABS: 5.0000 mg | ORAL_TABLET | Freq: Every day | ORAL | 3 refills | Status: DC

## 2022-09-26 NOTE — Telephone Encounter (Signed)
Prescription for Crestor 5 mg has been sent in to pharmacy.

## 2022-10-03 ENCOUNTER — Encounter (INDEPENDENT_AMBULATORY_CARE_PROVIDER_SITE_OTHER): Payer: 59

## 2022-10-03 ENCOUNTER — Telehealth: Payer: Self-pay

## 2022-10-03 DIAGNOSIS — U071 COVID-19: Secondary | ICD-10-CM | POA: Diagnosis not present

## 2022-10-03 DIAGNOSIS — E782 Mixed hyperlipidemia: Secondary | ICD-10-CM

## 2022-10-03 DIAGNOSIS — Z79899 Other long term (current) drug therapy: Secondary | ICD-10-CM

## 2022-10-03 MED ORDER — ROSUVASTATIN CALCIUM 10 MG PO TABS: 10.0000 mg | ORAL_TABLET | Freq: Every day | ORAL | 3 refills | Status: DC

## 2022-10-03 MED ORDER — NIRMATRELVIR/RITONAVIR (PAXLOVID)TABLET
3.0000 | ORAL_TABLET | Freq: Two times a day (BID) | ORAL | 0 refills | Status: AC
Start: 2022-10-03 — End: 2022-10-08

## 2022-10-03 NOTE — Telephone Encounter (Signed)
Reviewed M. Swinyer's recommendations for increasing crestor to 10 mg, patient agrees to try this dose. She states she has a large supply of 5 mg crestor tablets and would like to just double these to make up her new 10 mg dose. Provided education about side effects of muscle/joint aches, advised patient to call our office if she experiences this. Patient verbalizes understanding, follow up labs (ALT, FLP scheduled for 11/28/22.) Medication list updated.

## 2022-10-03 NOTE — Telephone Encounter (Signed)

## 2022-10-20 ENCOUNTER — Other Ambulatory Visit: Payer: Self-pay | Admitting: Internal Medicine

## 2022-10-20 ENCOUNTER — Other Ambulatory Visit: Payer: Self-pay | Admitting: Medical Oncology

## 2022-10-20 DIAGNOSIS — D72829 Elevated white blood cell count, unspecified: Secondary | ICD-10-CM

## 2022-10-21 ENCOUNTER — Inpatient Hospital Stay: Payer: 59 | Attending: Internal Medicine

## 2022-10-21 ENCOUNTER — Other Ambulatory Visit: Payer: Self-pay

## 2022-10-21 ENCOUNTER — Inpatient Hospital Stay: Payer: 59 | Admitting: Internal Medicine

## 2022-10-21 VITALS — BP 116/79 | HR 96 | Temp 97.5°F | Resp 16 | Ht 71.0 in | Wt 211.2 lb

## 2022-10-21 DIAGNOSIS — D72829 Elevated white blood cell count, unspecified: Secondary | ICD-10-CM | POA: Diagnosis present

## 2022-10-21 DIAGNOSIS — Z79899 Other long term (current) drug therapy: Secondary | ICD-10-CM | POA: Diagnosis not present

## 2022-10-21 DIAGNOSIS — J45909 Unspecified asthma, uncomplicated: Secondary | ICD-10-CM | POA: Diagnosis not present

## 2022-10-21 DIAGNOSIS — K219 Gastro-esophageal reflux disease without esophagitis: Secondary | ICD-10-CM | POA: Diagnosis not present

## 2022-10-21 DIAGNOSIS — E785 Hyperlipidemia, unspecified: Secondary | ICD-10-CM | POA: Insufficient documentation

## 2022-10-21 DIAGNOSIS — M1990 Unspecified osteoarthritis, unspecified site: Secondary | ICD-10-CM | POA: Insufficient documentation

## 2022-10-21 LAB — CMP (CANCER CENTER ONLY)
ALT: 13 U/L (ref 0–44)
AST: 13 U/L — ABNORMAL LOW (ref 15–41)
Albumin: 4.1 g/dL (ref 3.5–5.0)
Alkaline Phosphatase: 64 U/L (ref 38–126)
Anion gap: 6 (ref 5–15)
BUN: 10 mg/dL (ref 6–20)
CO2: 27 mmol/L (ref 22–32)
Calcium: 9.3 mg/dL (ref 8.9–10.3)
Chloride: 107 mmol/L (ref 98–111)
Creatinine: 0.69 mg/dL (ref 0.44–1.00)
GFR, Estimated: >60 mL/min (ref >60)
Glucose, Bld: 99 mg/dL (ref 70–99)
Potassium: 3.9 mmol/L (ref 3.5–5.1)
Sodium: 140 mmol/L (ref 135–145)
Total Bilirubin: 0.5 mg/dL (ref 0.3–1.2)
Total Protein: 6.4 g/dL — ABNORMAL LOW (ref 6.5–8.1)

## 2022-10-21 LAB — CBC WITH DIFFERENTIAL (CANCER CENTER ONLY)
Abs Immature Granulocytes: 0.04 10*3/uL (ref 0.00–0.07)
Basophils Absolute: 0 10*3/uL (ref 0.0–0.1)
Basophils Relative: 0 %
Eosinophils Absolute: 0.1 10*3/uL (ref 0.0–0.5)
Eosinophils Relative: 1 %
HCT: 41.2 % (ref 36.0–46.0)
Hemoglobin: 14.3 g/dL (ref 12.0–15.0)
Immature Granulocytes: 0 %
Lymphocytes Relative: 23 %
Lymphs Abs: 2.7 10*3/uL (ref 0.7–4.0)
MCH: 31.9 pg (ref 26.0–34.0)
MCHC: 34.7 g/dL (ref 30.0–36.0)
MCV: 92 fL (ref 80.0–100.0)
Monocytes Absolute: 0.6 10*3/uL (ref 0.1–1.0)
Monocytes Relative: 5 %
Neutro Abs: 8.1 10*3/uL — ABNORMAL HIGH (ref 1.7–7.7)
Neutrophils Relative %: 71 %
Platelet Count: 317 10*3/uL (ref 150–400)
RBC: 4.48 MIL/uL (ref 3.87–5.11)
RDW: 12.9 % (ref 11.5–15.5)
WBC Count: 11.7 10*3/uL — ABNORMAL HIGH (ref 4.0–10.5)
nRBC: 0 % (ref 0.0–0.2)

## 2022-10-21 LAB — LACTATE DEHYDROGENASE: LDH: 136 U/L (ref 98–192)

## 2022-10-21 NOTE — Progress Notes (Signed)
[facility] [phone]) [phone]   [phone]  CONSULT NOTE  REFERRING PHYSICIAN: Vernona Rieger, NP  REASON FOR CONSULTATION:  47 years old white female with leukocytosis  HPI Elaine [name] is a 47 y.o. female with past medical history significant for multiple medical problems including history of malignant thermia, ADHD, asthma, endometriosis, diverticulosis and diverticulitis, alpha 1 antitrypsin followed by Dr. Celine Mans, fibroid tumors, abnormal uterine bleeding, dyslipidemia, heart murmur, psoriasis as well as seizure at age 64, ruptured ovarian cyst, GERD and giantism.  The patient was seen recently by her primary care provider and on routine blood work she was noted to have persistent leukocytosis.  Her CBC on 09/24/2022 showed total white blood count of 14.5 with normal hemoglobin, hematocrit as well as platelets count the patient had similar results over the last 6 years at least that has been much higher up to 25.3 but she had inflammatory process during these times.  She was recently diagnosed with COVID-19 on September 29, 2022 was still have some soreness in the neck area.  She also has aching pain all over her body especially in the upper cervical and lumbar disks.  She has history of osteoarthritis since age 49.  She is scheduled to have colonoscopy next months but she had history of septic diverticulitis in the past. The patient denied having any current chest pain but has occasional shortness of breath with exertion.  She has no cough or hemoptysis.  She feels tired most of the time.  She denied having any nausea, vomiting, diarrhea or constipation.  She has history of migraine headache and she takes medication for it. Family history significant for father with a stroke at age 17 and myocardial infarction at age 52 he still alive.  Mother is obese and have diabetes and hypertension.  The patient has 3 siblings and they are healthy. She is married and has 1  daughter.  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  She has no history for smoking and drinks alcohol occasionally and no history of drug abuse. HPI  Past Medical History:  Diagnosis Date   Abnormal uterine bleeding 12/27/2020   ADHD (attention deficit hyperactivity disorder)    Alpha-1-antitrypsin deficiency (HCC)    Arthritis    Asthma    Bulging lumbar disc    Diverticulitis    with sepsis   Diverticulosis    Endometritis 05/19/2021   Family history of adverse reaction to anesthesia    malignant Hyperthermia- father and sister   Fibroids    Vaginal   GERD (gastroesophageal reflux disease)    Giantism (HCC)    No compelte work up, but suspected   Headache    Heart murmur    " Muscial " murmer -   History of hiatal hernia    Hyperlipidemia    Malignant hyperthermia    Orthostatic hypotension    Psoriasis    Ruptured ovarian cyst 2004,2016   Seizures (HCC)    none since age 13    Past Surgical History:  Procedure Laterality Date   BREAST BIOPSY Right 07/04/2022   [place] RT BREAST BX W LOC DEV 1ST LESION IMG BX SPEC US GUIDE 07/04/2022 [facility]   DENTAL SURGERY     DILITATION & CURRETTAGE/HYSTROSCOPY WITH HYDROTHERMAL ABLATION N/A 04/16/2021   Procedure: DILATATION & CURETTAGE/HYSTEROSCOPY WITH HYDROTHERMAL ABLATION;  Surgeon: Reva Bores, MD;  Location: [facility] OR;  Service: Gynecology;  Laterality: N/A;  rep will be here confirmed on 04/08/21 CS   LAPAROSCOPIC SIGMOID COLECTOMY  2013    Family History  Problem Relation Age of Onset   Stroke Father 18   Heart attack Father 76   Hyperlipidemia Father    Prostate cancer Father    Stomach cancer Neg Hx    Colon polyps Neg Hx    Esophageal cancer Neg Hx    Pancreatic cancer Neg Hx     Social History Social History   Tobacco Use   Smoking status: Former    Current packs/day: 0.00    Types: Cigarettes   Smokeless tobacco: Never  Vaping Use   Vaping status: Never Used  Substance Use Topics    Alcohol use: Yes    Comment: ocassional- 1-2 drinks   Drug use: Never    Allergies  Allergen Reactions   Other Nausea And Vomiting    Opioids   Cephalexin Other (See Comments)    Blisters in mouth    Zetia [Ezetimibe]     Shoulder pain   Penicillins Rash    Hasn't had since childhood Itching with Zosyn 05/2021    Current Outpatient Medications  Medication Sig Dispense Refill   betamethasone dipropionate 0.05 % lotion 1 liberally Topical Daily     Cholecalciferol (VITAMIN D3 PO) Take 1 capsule by mouth daily.     cyanocobalamin (VITAMIN B12) 500 MCG tablet Take by mouth.     fluticasone (FLONASE) 50 MCG/ACT nasal spray Place 2 sprays into both nostrils daily.     Fluticasone-Umeclidin-Vilant (TRELEGY ELLIPTA) 200-62.5-25 MCG/ACT AEPB Inhale 1 puff into the lungs daily. 1 each 5   Galcanezumab-gnlm (EMGALITY) 120 MG/ML SOAJ Inject 120 mg into the skin every 30 (thirty) days. 1.12 mL 11   MAGNESIUM PO Take 1 capsule by mouth daily.     rosuvastatin (CRESTOR) 10 MG tablet Take 1 tablet (10 mg total) by mouth daily. 90 tablet 3   zolmitriptan (ZOMIG) 5 MG tablet Take 1 tablet by mouth at migraine onset, may repeat 2 hours later if migraine persists. Do not exceed 2 tablets in 24 hours. 10 tablet 11   No current facility-administered medications for this visit.    Review of Systems  Constitutional: positive for fatigue Eyes: negative Ears, nose, mouth, throat, and face: negative Respiratory: positive for dyspnea on exertion Cardiovascular: negative Gastrointestinal: negative Genitourinary:negative Integument/breast: negative Hematologic/lymphatic: negative Musculoskeletal:positive for arthralgias Neurological: negative Behavioral/Psych: negative Endocrine: negative Allergic/Immunologic: negative  Physical Exam  ZOX:WRUEA, healthy, no distress, well nourished, and well developed SKIN: skin color, texture, turgor are normal, no rashes or significant lesions HEAD:  Normocephalic, No masses, lesions, tenderness or abnormalities EYES: normal, PERRLA, Conjunctiva are pink and non-injected EARS: External ears normal, Canals clear OROPHARYNX:no exudate, no erythema, and lips, buccal mucosa, and tongue normal  NECK: supple, no adenopathy, no JVD LYMPH:  no palpable lymphadenopathy, no hepatosplenomegaly BREAST:not examined LUNGS: clear to auscultation , and palpation HEART: regular rate & rhythm, no murmurs, and no gallops ABDOMEN:abdomen soft, non-tender, normal bowel sounds, and no masses or organomegaly BACK: Back symmetric, no curvature., No CVA tenderness EXTREMITIES:no joint deformities, effusion, or inflammation, no edema  NEURO: alert & oriented x 3 with fluent speech, no focal motor/sensory deficits  PERFORMANCE STATUS: ECOG 0  LABORATORY DATA: Lab Results  Component Value Date   WBC 11.7 (H) 10/21/2022   HGB 14.3 10/21/2022   HCT 41.2 10/21/2022   MCV 92.0 10/21/2022   PLT 317 10/21/2022      Chemistry  Component Value Date/Time   NA 137 09/24/2022 1116   NA 139 07/30/2021 1046   K 4.9 09/24/2022 1116   CL 102 09/24/2022 1116   CO2 27 09/24/2022 1116   BUN 20 09/24/2022 1116   BUN 11 07/30/2021 1046   CREATININE 0.80 09/24/2022 1116      Component Value Date/Time   CALCIUM 10.1 09/24/2022 1116   ALKPHOS 68 09/24/2022 1116   AST 17 09/24/2022 1116   ALT 16 09/24/2022 1116   BILITOT 0.7 09/24/2022 1116   BILITOT 0.9 07/09/2020 0839       RADIOGRAPHIC STUDIES: No results found.  ASSESSMENT: This is a very pleasant 47 years old white female with persistent leukocytosis most likely reactive leukocytosis from a inflammatory process as the patient has a lot of comorbidities and medical history and inflammatory diseases but myeloproliferative disorder could not be completely excluded at this point.   PLAN: I had a lengthy discussion with the patient today about her condition and further investigation to rule out any  underlying myeloproliferative disorder. I ordered several studies including repeat CBC that showed slightly elevated white blood count of 11.7 with absolute neutrophil count of 8100 but she has normal hemoglobin and hematocrit as well as platelet count.  Comprehensive metabolic panel is unremarkable except for low serum protein and AST.  LDH is normal at 136. I assured the patient that this is likely inflammatory in origin but other test like BCR/ABL FISH study and JAK2 mutations are still pending.  If the results of this test are negative, I would recommend for the patient to continue her routine follow-up visit and evaluation by her primary care physician from now on and her leukocytosis is likely inflammatory in origin. If there is any concerning abnormalities on the pending blood work, I will call the patient with additional recommendation. The patient was advised to call if she has any other concerning symptoms in the interval.  The patient voices understanding of current disease status and treatment options and is in agreement with the current care plan.  All questions were answered. The patient knows to call the clinic with any problems, questions or concerns. We can certainly see the patient much sooner if necessary.  Thank you so much for allowing me to participate in the care of Elaine [name]. I will continue to follow up the patient with you and assist in her care.  The total time spent in the appointment was 60 minutes.  Disclaimer: This note was dictated with voice recognition software. Similar sounding words can inadvertently be transcribed and may not be corrected upon review.   Lajuana Matte October 21, 2022, 11:35 AM

## 2022-11-03 ENCOUNTER — Ambulatory Visit (AMBULATORY_SURGERY_CENTER): Payer: 59 | Admitting: *Deleted

## 2022-11-03 ENCOUNTER — Ambulatory Visit (INDEPENDENT_AMBULATORY_CARE_PROVIDER_SITE_OTHER): Payer: 59 | Admitting: Internal Medicine

## 2022-11-03 ENCOUNTER — Other Ambulatory Visit: Payer: Self-pay

## 2022-11-03 VITALS — Ht 71.0 in | Wt 210.0 lb

## 2022-11-03 DIAGNOSIS — Q676 Pectus excavatum: Secondary | ICD-10-CM

## 2022-11-03 DIAGNOSIS — Z1211 Encounter for screening for malignant neoplasm of colon: Secondary | ICD-10-CM

## 2022-11-03 DIAGNOSIS — R0602 Shortness of breath: Secondary | ICD-10-CM

## 2022-11-03 DIAGNOSIS — J4520 Mild intermittent asthma, uncomplicated: Secondary | ICD-10-CM

## 2022-11-03 LAB — PULMONARY FUNCTION TEST
FEF 25-75 Post: 2.79 L/s
FEF 25-75 Pre: 2.74 L/s
FEF2575-%Change-Post: 2 %
FEF2575-%Pred-Post: 84 %
FEF2575-%Pred-Pre: 83 %
FEV1-%Change-Post: 0 %
FEV1-%Pred-Post: 77 %
FEV1-%Pred-Pre: 77 %
FEV1-Post: 2.72 L
FEV1-Pre: 2.72 L
FEV1FVC-%Change-Post: 0 %
FEV1FVC-%Pred-Pre: 100 %
FEV6-%Change-Post: 0 %
FEV6-%Pred-Post: 77 %
FEV6-%Pred-Pre: 77 %
FEV6-Post: 3.33 L
FEV6-Pre: 3.34 L
FEV6FVC-%Pred-Post: 102 %
FEV6FVC-%Pred-Pre: 102 %
FVC-%Change-Post: 0 %
FVC-%Pred-Post: 75 %
FVC-%Pred-Pre: 75 %
FVC-Post: 3.33 L
FVC-Pre: 3.34 L
Post FEV1/FVC ratio: 82 %
Post FEV6/FVC ratio: 100 %
Pre FEV1/FVC ratio: 81 %
Pre FEV6/FVC Ratio: 100 %

## 2022-11-03 MED ORDER — NA SULFATE-K SULFATE-MG SULF 17.5-3.13-1.6 GM/177ML PO SOLN
1.0000 | Freq: Once | ORAL | 0 refills | Status: AC
Start: 2022-11-03 — End: 2022-11-03

## 2022-11-03 NOTE — Patient Instructions (Signed)
Pre and Post Spiro performed today. 

## 2022-11-03 NOTE — Progress Notes (Signed)
Pre and Post Spiro performed today. 

## 2022-11-03 NOTE — Progress Notes (Signed)
Pt's previsit is done over the phone and all paperwork (prep instructions) sent to patient.  Pt's name and DOB verified at the beginning of the previsit.  Pt denies any difficulty with ambulating.   No egg or soy allergy known to patient  No issues known to pt with past sedation with any surgeries or procedures Patient denies ever being told they had issues or difficulty with intubation  FH of malignant hyperthermia, she also has this Pt is not on diet pills Pt is not on  home 02  Pt is not on blood thinners  2 day Suprep/Miralax given Pt is not on dialysis Pt denies any upcoming cardiac testing Pt encouraged to use to use Singlecare or Goodrx to reduce cost

## 2022-11-11 ENCOUNTER — Ambulatory Visit: Payer: 59 | Admitting: Internal Medicine

## 2022-11-11 ENCOUNTER — Encounter: Payer: Self-pay | Admitting: Internal Medicine

## 2022-11-11 VITALS — BP 110/76 | HR 78 | Temp 97.9°F | Ht 71.0 in | Wt 212.4 lb

## 2022-11-11 DIAGNOSIS — R0602 Shortness of breath: Secondary | ICD-10-CM

## 2022-11-11 DIAGNOSIS — Q676 Pectus excavatum: Secondary | ICD-10-CM | POA: Diagnosis not present

## 2022-11-11 NOTE — Progress Notes (Signed)
Elaine [name]    [id number]    11-16-1975  Primary Care Physician:Clark, Keane Scrape, NP Date of Appointment: 11/11/2022 Established Patient Visit  Chief complaint:   Chief Complaint  Patient presents with   Follow-up    Doing well.     HPI: Elaine [name] is a 47 y.o. woman wwith A1AT heterozygote and mild pectus excavatum. Additional medical history spinal stenosis, chiari malformation, migraines.   Interval Updates: Here for follow up after PFTs which show normal and stable pulmonary function from two years ago.  Still short of breath with exertion/   She has vasovagal syncope and has had a hip fracture from this in the past related to a fall. Falls happen a couple of times a year. She has had episodes of tachycardia and bradycardia   Inhalers do not help.   No pneumonia, bronchitis, wheezing, coughing.   I have reviewed the patient's family social and past medical history and updated as appropriate.   Past Medical History:  Diagnosis Date   Abnormal uterine bleeding 12/27/2020   ADHD (attention deficit hyperactivity disorder)    Alpha-1-antitrypsin deficiency (HCC)    Anxiety    Arthritis    Asthma    Bulging lumbar disc    Diverticulitis    with sepsis   Diverticulosis    Endometritis 05/19/2021   Family history of adverse reaction to anesthesia    malignant Hyperthermia- father and sister   Fibroids    Vaginal   GERD (gastroesophageal reflux disease)    Giantism (HCC)    No compelte work up, but suspected   Headache    Heart murmur    " Muscial " murmer -   History of hiatal hernia    Hyperlipidemia    Malignant hyperthermia    Orthostatic hypotension    Psoriasis    Ruptured ovarian cyst 2004,2016   Seizures (HCC)    none since age 42   Thyroid disease    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    Past Surgical History:  Procedure Laterality Date   BREAST BIOPSY Right 07/04/2022   [place] RT BREAST BX W LOC DEV 1ST LESION IMG BX SPEC US GUIDE 07/04/2022  [facility]   COLONOSCOPY     DENTAL SURGERY     DILITATION & CURRETTAGE/HYSTROSCOPY WITH HYDROTHERMAL ABLATION N/A 04/16/2021   Procedure: DILATATION & CURETTAGE/HYSTEROSCOPY WITH HYDROTHERMAL ABLATION;  Surgeon: Reva Bores, MD;  Location: [facility] OR;  Service: Gynecology;  Laterality: N/A;  rep will be here confirmed on 04/08/21 CS   LAPAROSCOPIC SIGMOID COLECTOMY  2013    Family History  Problem Relation Age of Onset   Stroke Father 18   Heart attack Father 78   Hyperlipidemia Father    Prostate cancer Father    Colon polyps Paternal Grandmother    Stomach cancer Neg Hx    Esophageal cancer Neg Hx    Pancreatic cancer Neg Hx    Colon cancer Neg Hx     Social History   Occupational History   Not on file  Tobacco Use   Smoking status: Former    Current packs/day: 0.25    Average packs/day: 0.3 packs/day for 30.6 years (7.7 ttl pk-yrs)    Types: Cigarettes    Start date: 1994   Smokeless tobacco: Never  Vaping Use   Vaping status: Never Used  Substance and Sexual Activity   Alcohol use: Yes    Comment: ocassional- 1-2 drinks   Drug  use: Never   Sexual activity: Yes     Physical Exam: Blood pressure 110/76, pulse 78, temperature 97.9 F (36.6 C), temperature source Oral, height 5\' 11"  (1.803 m), weight 212 lb 6.4 oz (96.3 kg), SpO2 98%.  Gen:      No acute distress Lungs:   ctab, no wheeze, pectus excavatum present CV:         RRR no mrg   Data Reviewed: Imaging: I have personally reviewed the chest xray today shows mild pectus excavatum. No acute process  PFTs:     Latest Ref Rng & Units 11/03/2022    9:53 AM 10/09/2020   10:55 AM 02/06/2017   10:57 AM  PFT Results  FVC-Pre L 3.34  P 3.20  3.57   FVC-Predicted Pre % 75  P 71  79   FVC-Post L 3.33  P 3.43  3.60   FVC-Predicted Post % 75  P 76  80   Pre FEV1/FVC % % 81  P 78  77   Post FEV1/FCV % % 82  P 81  81   FEV1-Pre L 2.72  P 2.51  2.75   FEV1-Predicted Pre % 77  P 70  75   FEV1-Post L 2.72   P 2.76  2.91   DLCO uncorrected ml/min/mmHg  24.38  23.87   DLCO UNC% %  92  73   DLCO corrected ml/min/mmHg  24.38  22.27   DLCO COR %Predicted %  92  68   DLVA Predicted %  122  80   TLC L  5.26  5.32   TLC % Predicted %  87  88   RV % Predicted %  89  76     P Preliminary result   I have personally reviewed the patient's PFTs and normal pulmonary function and relative stability from 2018 to 2024.   Labs: Lab Results  Component Value Date   WBC 11.7 (H) 10/21/2022   HGB 14.3 10/21/2022   HCT 41.2 10/21/2022   MCV 92.0 10/21/2022   PLT 317 10/21/2022   Lab Results  Component Value Date   NA 140 10/21/2022   K 3.9 10/21/2022   CL 107 10/21/2022   CO2 27 10/21/2022    Immunization status: Immunization History  Administered Date(s) Administered   Hepatitis B 05/30/1990   Influenza, Seasonal, Injecte, Preservative Fre 11/28/2016   Influenza,inj,Quad PF,6+ Mos 02/14/2015, 11/28/2016, 12/30/2017, 11/30/2018, 12/26/2020, 11/08/2021   Influenza,inj,quad, With Preservative 02/14/2015   Influenza-Unspecified 01/30/2015   MMR 05/30/1990   PFIZER(Purple Top)SARS-COV-2 Vaccination 06/16/2019, 07/07/2019, 12/23/2019, 06/29/2020, 12/02/2020   Pneumococcal-Unspecified 03/31/2013   Tdap 09/28/2013    External Records Personally Reviewed: cardiology, oncology, family med  Assessment:  Shortness of breath Pectus Excavatum A1AT heterozygote deficiency  Plan/Recommendations:  PFTs have shown stable FVC since 2018. I think very unlikely her symptoms are related to pectus excavatum.  She does have episodes of tachycardia/bradycardia and some vasovagal syncope. I suspect some dysautonomia could be contributing.   Ok to stop all inhalers.   At this point given stable lung function I am happy to see her back as needed if symptoms worsen or change.   Return to Care: Return if symptoms worsen or fail to improve.   Durel Salts, MD Pulmonary and Critical Care Medicine [facility] Office:[phone]

## 2022-11-11 NOTE — Patient Instructions (Addendum)
Please come see me if your symptoms change.  Your lung function has been stable for the past several years.   You can stop all inhalers.

## 2022-11-13 ENCOUNTER — Encounter: Payer: Self-pay | Admitting: Gastroenterology

## 2022-11-17 ENCOUNTER — Other Ambulatory Visit: Payer: Self-pay | Admitting: Family Medicine

## 2022-11-17 DIAGNOSIS — N6310 Unspecified lump in the right breast, unspecified quadrant: Secondary | ICD-10-CM

## 2022-11-18 ENCOUNTER — Encounter (HOSPITAL_COMMUNITY): Payer: Self-pay | Admitting: Gastroenterology

## 2022-11-18 NOTE — Progress Notes (Signed)
Elaine [name]  PCP-Katharine Chestine Spore NP Cardiologist-Dr Nahser  EKG-08/27/22 Echo-04/05/19 Cath-n/a Stress-n/a ICD/PM-n/a Blood thinner-n/a  GLP-1- n/a   Hx: Heart Murmur, Asthma, Seizures, orthostatic hypotension, Chiari Malformation, A1AT heterozygote, Malignant Hyperthermia. Patient last seen in cardiology 08/27/22 with recommended f/u 6 mo. Last visit with pulmonology was 11/11/22, she endorses a chronic cough but that is not new, no use of 02. She says shes never had any issue with propofol before and has had many colonoscopies without issue.  Anesthesia Review: Yes

## 2022-11-24 NOTE — Anesthesia Preprocedure Evaluation (Signed)
Anesthesia Evaluation  Patient identified by MRN, date of birth, ID band Patient awake    Reviewed: Allergy & Precautions, NPO status , Patient's Chart, lab work & pertinent test results  History of Anesthesia Complications (+) Family history of anesthesia reaction and history of anesthetic complications (father and sister with MH)  Airway Mallampati: I  TM Distance: >3 FB Neck ROM: Full    Dental  (+) Dental Advisory Given, Chipped   Pulmonary asthma (alpha 1 antitrypsin deficiency) , former smoker   breath sounds clear to auscultation       Cardiovascular (-) angina  Rhythm:Regular Rate:Normal  '21 ECHO: EF 60-65%, normal LVF, normal RVF, mild MR   Neuro/Psych  Headaches PSYCHIATRIC DISORDERS (ADHD) Anxiety     Back pain    GI/Hepatic Neg liver ROS, hiatal hernia,GERD  Controlled,,  Endo/Other  Hypothyroidism    Renal/GU negative Renal ROS     Musculoskeletal   Abdominal   Peds  Hematology negative hematology ROS (+)   Anesthesia Other Findings   Reproductive/Obstetrics ablation                             Anesthesia Physical Anesthesia Plan  ASA: 3  Anesthesia Plan: MAC   Post-op Pain Management: Minimal or no pain anticipated   Induction:   PONV Risk Score and Plan: 2 and Treatment may vary due to age or medical condition  Airway Management Planned: Natural Airway and Simple Face Mask  Additional Equipment: None  Intra-op Plan:   Post-operative Plan:   Informed Consent: I have reviewed the patients History and Physical, chart, labs and discussed the procedure including the risks, benefits and alternatives for the proposed anesthesia with the patient or authorized representative who has indicated his/her understanding and acceptance.     Dental advisory given  Plan Discussed with: CRNA and Surgeon  Anesthesia Plan Comments: (Non-triggering MAC)         Anesthesia Quick Evaluation

## 2022-11-25 ENCOUNTER — Other Ambulatory Visit: Payer: Self-pay

## 2022-11-25 ENCOUNTER — Encounter (HOSPITAL_COMMUNITY)
Admission: RE | Disposition: A | Discharge status: Home / Self Care | Payer: Self-pay | Source: Home / Self Care | Attending: Gastroenterology

## 2022-11-25 ENCOUNTER — Ambulatory Visit (HOSPITAL_COMMUNITY)
Admission: RE | Admit: 2022-11-25 | Discharge: 2022-11-25 | Disposition: A | Discharge status: Home / Self Care | Payer: 59 | Attending: Gastroenterology | Admitting: Gastroenterology

## 2022-11-25 ENCOUNTER — Encounter (HOSPITAL_COMMUNITY): Payer: Self-pay | Admitting: Gastroenterology

## 2022-11-25 ENCOUNTER — Ambulatory Visit (HOSPITAL_COMMUNITY): Payer: 59 | Admitting: Anesthesiology

## 2022-11-25 ENCOUNTER — Ambulatory Visit (HOSPITAL_BASED_OUTPATIENT_CLINIC_OR_DEPARTMENT_OTHER): Payer: 59 | Admitting: Anesthesiology

## 2022-11-25 DIAGNOSIS — E785 Hyperlipidemia, unspecified: Secondary | ICD-10-CM | POA: Diagnosis not present

## 2022-11-25 DIAGNOSIS — Z87891 Personal history of nicotine dependence: Secondary | ICD-10-CM | POA: Insufficient documentation

## 2022-11-25 DIAGNOSIS — D120 Benign neoplasm of cecum: Secondary | ICD-10-CM

## 2022-11-25 DIAGNOSIS — Z1211 Encounter for screening for malignant neoplasm of colon: Secondary | ICD-10-CM

## 2022-11-25 DIAGNOSIS — K635 Polyp of colon: Secondary | ICD-10-CM | POA: Diagnosis not present

## 2022-11-25 DIAGNOSIS — E8801 Alpha-1-antitrypsin deficiency: Secondary | ICD-10-CM | POA: Insufficient documentation

## 2022-11-25 DIAGNOSIS — D123 Benign neoplasm of transverse colon: Secondary | ICD-10-CM

## 2022-11-25 DIAGNOSIS — Z980 Intestinal bypass and anastomosis status: Secondary | ICD-10-CM | POA: Diagnosis not present

## 2022-11-25 DIAGNOSIS — Z9049 Acquired absence of other specified parts of digestive tract: Secondary | ICD-10-CM | POA: Diagnosis not present

## 2022-11-25 DIAGNOSIS — K5909 Other constipation: Secondary | ICD-10-CM | POA: Diagnosis not present

## 2022-11-25 DIAGNOSIS — D126 Benign neoplasm of colon, unspecified: Secondary | ICD-10-CM

## 2022-11-25 DIAGNOSIS — K5730 Diverticulosis of large intestine without perforation or abscess without bleeding: Secondary | ICD-10-CM | POA: Insufficient documentation

## 2022-11-25 HISTORY — PX: COLONOSCOPY WITH PROPOFOL: SHX5780

## 2022-11-25 HISTORY — PX: POLYPECTOMY: SHX5525

## 2022-11-25 SURGERY — COLONOSCOPY WITH PROPOFOL
Anesthesia: Monitor Anesthesia Care

## 2022-11-25 MED ORDER — PROPOFOL 500 MG/50ML IV EMUL
INTRAVENOUS | Status: DC | PRN
  Administered 2022-11-25 (×2): 30 mg via INTRAVENOUS
  Administered 2022-11-25: 20 mg via INTRAVENOUS
  Administered 2022-11-25: 125 ug/kg/min via INTRAVENOUS
  Administered 2022-11-25 (×2): 30 mg via INTRAVENOUS
  Administered 2022-11-25: 20 mg via INTRAVENOUS
  Administered 2022-11-25: 30 mg via INTRAVENOUS
  Administered 2022-11-25: 20 mg via INTRAVENOUS
  Administered 2022-11-25: 125 ug/kg/min via INTRAVENOUS
  Administered 2022-11-25 (×2): 30 mg via INTRAVENOUS

## 2022-11-25 MED ORDER — PROPOFOL 10 MG/ML IV BOLUS
INTRAVENOUS | Status: AC
  Filled 2022-11-25: qty 20

## 2022-11-25 MED ORDER — SODIUM CHLORIDE 0.9 % IV SOLN: INTRAVENOUS | Status: DC

## 2022-11-25 MED ORDER — LACTATED RINGERS IV SOLN
INTRAVENOUS | Status: DC
  Administered 2022-11-25: 1000 mL via INTRAVENOUS

## 2022-11-25 MED ORDER — PROPOFOL 1000 MG/100ML IV EMUL
INTRAVENOUS | Status: AC
  Filled 2022-11-25: qty 100

## 2022-11-25 MED ORDER — LIDOCAINE 2% (20 MG/ML) 5 ML SYRINGE
INTRAMUSCULAR | Status: DC | PRN
  Administered 2022-11-25: 60 mg via INTRAVENOUS

## 2022-11-25 MED ORDER — PROPOFOL 500 MG/50ML IV EMUL
INTRAVENOUS | Status: AC
  Filled 2022-11-25: qty 50

## 2022-11-25 SURGICAL SUPPLY — 22 items

## 2022-11-25 NOTE — Op Note (Signed)
[facility] Patient Name: Elaine [name] Procedure Date: 11/25/2022 MRN: [id number] Attending MD: Dub Amis. Tomasa Rand , MD, [id number] Date of Birth: 1975-12-30 CSN: [id number] Age: 47 Admit Type: Outpatient Procedure:                Colonoscopy Indications:              Screening for colorectal malignant neoplasm,                            Screening for colorectal malignant neoplasm (last                            colonoscopy was more than 10 years ago); remote                            history of sigmoidectomy from perforated                            diverticulitis Providers:                Lorin Picket E. Tomasa Rand, MD, Adin Hector, RN, Salley Scarlet, Technician Referring MD:              Medicines:                Monitored Anesthesia Care Complications:            No immediate complications. Estimated Blood Loss:     Estimated blood loss was minimal. Procedure:                Pre-Anesthesia Assessment:                           - Prior to the procedure, a History and Physical                            was performed, and patient medications and                            allergies were reviewed. The patient's tolerance of                            previous anesthesia was also reviewed. The risks                            and benefits of the procedure and the sedation                            options and risks were discussed with the patient.                            All questions were answered, and informed consent                            was obtained. Prior Anticoagulants: The  patient has                            taken no anticoagulant or antiplatelet agents. ASA                            Grade Assessment: III - A patient with severe                            systemic disease. After reviewing the risks and                            benefits, the patient was deemed in satisfactory                            condition to  undergo the procedure.                           After obtaining informed consent, the colonoscope                            was passed under direct vision. Throughout the                            procedure, the patient's blood pressure, pulse, and                            oxygen saturations were monitored continuously. The                            CF-HQ190L ([id number]) Olympus colonoscope was                            introduced through the anus and advanced to the the                            terminal ileum, with identification of the                            appendiceal orifice and IC valve. The colonoscopy                            was performed without difficulty. The patient                            tolerated the procedure well. The quality of the                            bowel preparation was good. The terminal ileum,                            ileocecal valve, appendiceal orifice, and rectum  were photographed. The bowel preparation used was                            Miralax and SUPREP via extended prep with split                            dose instruction. Scope In: 7:40:30 AM Scope Out: 8:06:46 AM Scope Withdrawal Time: 0 hours 22 minutes 57 seconds  Total Procedure Duration: 0 hours 26 minutes 16 seconds  Findings:      The perianal and digital rectal examinations were normal. Pertinent       negatives include normal sphincter tone and no palpable rectal lesions.      A 2 mm polyp was found in the cecum. The polyp was sessile. The polyp       was removed with a jumbo cold forceps. Resection and retrieval were       complete. Estimated blood loss was minimal.      A 10 mm polyp was found in the hepatic flexure. The polyp was flat. The       polyp was removed with a cold snare. Resection and retrieval were       complete. Estimated blood loss was minimal.      A 3 mm polyp was found in the transverse colon. The polyp was sessile.        The polyp was removed with a cold snare. Resection and retrieval were       complete. Estimated blood loss was minimal.      A few small-mouthed diverticula were found in the descending colon,       transverse colon and ascending colon.      There was evidence of a prior end-to-end colo-colonic anastomosis at 15       cm proximal to the anus. This was patent and was characterized by       healthy appearing mucosa.      The exam was otherwise normal throughout the examined colon.      The terminal ileum appeared normal.      The retroflexed view of the distal rectum and anal verge was normal and       showed no anal or rectal abnormalities. Impression:               - One 2 mm polyp in the cecum, removed with a jumbo                            cold forceps. Resected and retrieved.                           - One 10 mm polyp at the hepatic flexure, removed                            with a cold snare. Resected and retrieved.                           - One 3 mm polyp in the transverse colon, removed                            with a cold snare. Resected and retrieved.                           -  Mild diverticulosis in the descending colon, in                            the transverse colon and in the ascending colon.                           - Patent end-to-end colo-colonic anastomosis,                            characterized by healthy appearing mucosa.                           - The examined portion of the ileum was normal.                           - The distal rectum and anal verge are normal on                            retroflexion view. Moderate Sedation:      Not Applicable - Patient had care per Anesthesia. Recommendation:           - Patient has a contact number available for                            emergencies. The signs and symptoms of potential                            delayed complications were discussed with the                            patient. Return to normal  activities tomorrow.                            Written discharge instructions were provided to the                            patient.                           - Resume previous diet.                           - Continue present medications.                           - Await pathology results.                           - Repeat colonoscopy (date not yet determined) for                            surveillance based on pathology results. Procedure Code(s):        --- Professional ---                           [phone], Colonoscopy, flexible; with  removal of                            tumor(s), polyp(s), or other lesion(s) by snare                            technique                           45380, 59, Colonoscopy, flexible; with biopsy,                            single or multiple Diagnosis Code(s):        --- Professional ---                           D12.0, Benign neoplasm of cecum                           D12.3, Benign neoplasm of transverse colon (hepatic                            flexure or splenic flexure)                           Z98.0, Intestinal bypass and anastomosis status                           Z12.11, Encounter for screening for malignant                            neoplasm of colon                           K57.30, Diverticulosis of large intestine without                            perforation or abscess without bleeding CPT copyright 2022 American Medical Association. All rights reserved. The codes documented in this report are preliminary and upon coder review may  be revised to meet current compliance requirements. Makael Stein E. Tomasa Rand, MD 11/25/2022 8:15:58 AM This report has been signed electronically. Number of Addenda: 0

## 2022-11-25 NOTE — Discharge Instructions (Signed)

## 2022-11-25 NOTE — Anesthesia Postprocedure Evaluation (Signed)
Anesthesia Post Note  Patient: Elaine [name]  Procedure(s) Performed: COLONOSCOPY WITH PROPOFOL POLYPECTOMY     Patient location during evaluation: Endoscopy Anesthesia Type: MAC Level of consciousness: awake and alert, patient cooperative and oriented Pain management: pain level controlled Vital Signs Assessment: post-procedure vital signs reviewed and stable Respiratory status: nonlabored ventilation, spontaneous breathing and respiratory function stable Cardiovascular status: blood pressure returned to baseline and stable Postop Assessment: no apparent nausea or vomiting and able to ambulate Anesthetic complications: no   No notable events documented.  Last Vitals:  Vitals:   11/25/22 0834 11/25/22 0835  BP: 136/74   Pulse: 66 64  Resp: 12 12  Temp:    SpO2: 96% 100%    Last Pain:  Vitals:   11/25/22 0834  TempSrc:   PainSc: 0-No pain                 Ishmael Berkovich,E. Sankalp Ferrell

## 2022-11-25 NOTE — H&P (Signed)
[facility] History and Physical   Primary Care Physician:  Doreene Nest, NP   Reason for Procedure:   Colon cancer screening  Plan:    Screening colonoscopy     HPI: Elaine [name] is a 47 y.o. female undergoing average risk screening colonoscopy.  She had a colonoscopy in 2013.  She has no family history of colon cancer.  She has chronic constipation and underwent a 2-day bowel prep.   Past Medical History:  Diagnosis Date   Abnormal uterine bleeding 12/27/2020   ADHD (attention deficit hyperactivity disorder)    Alpha-1-antitrypsin deficiency (HCC)    Anxiety    Arthritis    Asthma    Bulging lumbar disc    Diverticulitis    with sepsis   Diverticulosis    Endometritis 05/19/2021   Family history of adverse reaction to anesthesia    malignant Hyperthermia- father and sister   Fibroids    Vaginal   GERD (gastroesophageal reflux disease)    Giantism (HCC)    No compelte work up, but suspected   Headache    Heart murmur    " Muscial " murmer -   History of hiatal hernia    Hyperlipidemia    Malignant hyperthermia    Orthostatic hypotension    Psoriasis    Ruptured ovarian cyst 2004,2016   Seizures (HCC)    none since age 24   Thyroid disease    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    Past Surgical History:  Procedure Laterality Date   BREAST BIOPSY Right 07/04/2022   [place] RT BREAST BX W LOC DEV 1ST LESION IMG BX SPEC US GUIDE 07/04/2022 [facility]   COLONOSCOPY     DENTAL SURGERY     DILITATION & CURRETTAGE/HYSTROSCOPY WITH HYDROTHERMAL ABLATION N/A 04/16/2021   Procedure: DILATATION & CURETTAGE/HYSTEROSCOPY WITH HYDROTHERMAL ABLATION;  Surgeon: Reva Bores, MD;  Location: [facility] OR;  Service: Gynecology;  Laterality: N/A;  rep will be here confirmed on 04/08/21 [facility]   LAPAROSCOPIC SIGMOID COLECTOMY  2013    Prior to Admission medications   Medication Sig Start Date End Date Taking? Authorizing Provider  betamethasone dipropionate 0.05 % lotion 1  liberally Topical Daily 05/28/21  Yes [provider]  cetirizine (ZYRTEC) 10 MG chewable tablet Chew 10 mg by mouth daily.   Yes [provider]  Cholecalciferol (VITAMIN D3 PO) Take 1 capsule by mouth daily.   Yes [provider]  cyanocobalamin (VITAMIN B12) 500 MCG tablet Take by mouth.   Yes [provider]  fluticasone (FLONASE) 50 MCG/ACT nasal spray Place 2 sprays into both nostrils daily.   Yes [provider]  Galcanezumab-gnlm (EMGALITY) 120 MG/ML SOAJ Inject 120 mg into the skin every 30 (thirty) days. 06/30/22  Yes McCue, Shanda Bumps, NP  MAGNESIUM PO Take 1 capsule by mouth daily.   Yes [provider]  Melatonin 1 MG CAPS daily.   Yes [provider]  Polyethylene Glycol 3350 (MIRALAX PO) Take by mouth. Several times weekly   Yes [provider]  rosuvastatin (CRESTOR) 10 MG tablet Take 1 tablet (10 mg total) by mouth daily. 10/03/22 01/01/23 Yes Swinyer, Zachary George, NP  zolmitriptan (ZOMIG) 5 MG tablet Take 1 tablet by mouth at migraine onset, may repeat 2 hours later if migraine persists. Do not exceed 2 tablets in 24 hours. 06/30/22  Yes Ihor Austin, NP    Current Facility-Administered Medications  Medication Dose Route Frequency Provider Last Rate Last Admin   0.9 %  sodium chloride infusion   Intravenous Continuous Jenel Lucks, MD       lactated ringers infusion   Intravenous Continuous Jenel Lucks, MD 10 mL/hr at 11/25/22 0640 1,000 mL at 11/25/22 0640    Allergies as of 09/10/2022 - Review Complete 09/03/2022  Allergen Reaction Noted   Other Nausea And Vomiting 04/05/2018   Cephalexin Other (See Comments) 01/15/2021   Penicillins Rash     Family History  Problem Relation Age of Onset   Stroke Father 67   Heart attack Father 52   Hyperlipidemia Father    Prostate cancer Father    Colon polyps Paternal Grandmother    Stomach cancer Neg Hx    Esophageal cancer Neg Hx    Pancreatic  cancer Neg Hx    Colon cancer Neg Hx     Social History   Socioeconomic History   Marital status: Married    Spouse name: Not on file   Number of children: Not on file   Years of education: Not on file   Highest education level: 12th grade  Occupational History   Not on file  Tobacco Use   Smoking status: Former    Current packs/day: 0.25    Average packs/day: 0.3 packs/day for 30.7 years (7.7 ttl pk-yrs)    Types: Cigarettes    Start date: 1994   Smokeless tobacco: Never  Vaping Use   Vaping status: Never Used  Substance and Sexual Activity   Alcohol use: Yes    Comment: ocassional- 1-2 drinks   Drug use: Never   Sexual activity: Yes  Other Topics Concern   Not on file  Social History Narrative   Married.   1 child.   Works as a Visual merchandiser.    Social Determinants of Health   Financial Resource Strain: Low Risk  (09/24/2022)   Overall Financial Resource Strain (CARDIA)    Difficulty of Paying Living Expenses: Not hard at all  Food Insecurity: No Food Insecurity (09/24/2022)   Hunger Vital Sign    Worried About Running Out of Food in the Last Year: Never true    Ran Out of Food in the Last Year: Never true  Transportation Needs: No Transportation Needs (09/24/2022)   PRAPARE - Administrator, Civil Service (Medical): No    Lack of Transportation (Non-Medical): No  Physical Activity: Sufficiently Active (09/24/2022)   Exercise Vital Sign    Days of Exercise per Week: 4 days    Minutes of Exercise per Session: 50 min  Stress: No Stress Concern Present (09/24/2022)   [employer] of Occupational Health - Occupational Stress Questionnaire    Feeling of Stress : Only a little  Social Connections: Moderately Isolated (09/24/2022)   Social Connection and Isolation Panel [NHANES]    Frequency of Communication with Friends and Family: More than three times a week    Frequency of Social Gatherings with Friends and Family: More than three times a week    Attends  Religious Services: Never    Database administrator or Organizations: No    Attends Engineer, structural: Not on file    Marital Status: Married  Catering manager Violence: Not on file    Review of Systems:  All other review of systems negative except as mentioned in the HPI.  Physical Exam: Vital signs BP 137/86   Pulse 84   Temp 97.7 F (36.5 C) (Tympanic)   Resp 20   Ht 5\' 11"  (1.803 m)  Wt 96.3 kg   SpO2 99%   BMI 29.61 kg/m   General:   Alert,  Well-developed, well-nourished, pleasant and cooperative in NAD Airway:  Mallampati 1 Lungs:  Clear throughout to auscultation.   Heart:  Regular rate and rhythm; no murmurs, clicks, rubs,  or gallops. Abdomen:  Soft, nontender and nondistended. Normal bowel sounds.   Neuro/Psych:  Normal mood and affect. A and O x 3   Vearl Allbaugh E. Tomasa Rand, MD [facility]

## 2022-11-25 NOTE — Transfer of Care (Signed)
Immediate Anesthesia Transfer of Care Note  Patient: Elaine [name]  Procedure(s) Performed: COLONOSCOPY WITH PROPOFOL POLYPECTOMY  Patient Location: PACU  Anesthesia Type:MAC  Level of Consciousness: drowsy  Airway & Oxygen Therapy: Patient Spontanous Breathing and Patient connected to face mask oxygen  Post-op Assessment: Report given to RN and Post -op Vital signs reviewed and stable  Post vital signs: Reviewed and stable  Last Vitals:  Vitals Value Taken Time  BP 120/65 11/25/22 0815  Temp 36.1 C 11/25/22 0815  Pulse 78 11/25/22 0819  Resp 20 11/25/22 0819  SpO2 100 % 11/25/22 0819  Vitals shown include unfiled device data.  Last Pain:  Vitals:   11/25/22 0815  TempSrc: Temporal  PainSc:          Complications: No notable events documented.

## 2022-11-26 ENCOUNTER — Encounter (HOSPITAL_COMMUNITY): Payer: Self-pay | Admitting: Gastroenterology

## 2022-11-26 LAB — SURGICAL PATHOLOGY

## 2022-11-27 NOTE — Progress Notes (Signed)
Elaine [name],   Two of the three polyps that I removed during your recent procedure were completely benign but were proven to be "pre-cancerous" polyps that MAY have grown into cancers if they had not been removed.  The small polyp in your cecum was not precancerous.  Studies shows that at least 20% of women over age 46 and 30% of men over age 15 have pre-cancerous polyps.  Based on current nationally recognized surveillance guidelines, I recommend that you have a repeat colonoscopy in 3 years.   If you develop any new rectal bleeding, abdominal pain or significant bowel habit changes, please contact me before then.

## 2022-11-28 ENCOUNTER — Ambulatory Visit: Payer: 59 | Attending: Nurse Practitioner

## 2022-11-28 DIAGNOSIS — E782 Mixed hyperlipidemia: Secondary | ICD-10-CM

## 2022-11-28 DIAGNOSIS — Z79899 Other long term (current) drug therapy: Secondary | ICD-10-CM

## 2022-11-28 LAB — LIPID PANEL
Chol/HDL Ratio: 3.7 ratio (ref 0.0–4.4)
Cholesterol, Total: 160 mg/dL (ref 100–199)
HDL: 43 mg/dL (ref >39)
LDL Chol Calc (NIH): 90 mg/dL (ref 0–99)
Triglycerides: 158 mg/dL — ABNORMAL HIGH (ref 0–149)
VLDL Cholesterol Cal: 27 mg/dL (ref 5–40)

## 2022-11-28 LAB — ALT: ALT: 13 IU/L (ref 0–32)

## 2022-12-04 MED ORDER — ROSUVASTATIN CALCIUM 10 MG PO TABS: 10.0000 mg | ORAL_TABLET | Freq: Every day | ORAL | 2 refills | Status: DC

## 2022-12-04 NOTE — Addendum Note (Signed)
Addended by: Loa Socks on: 12/04/2022 05:00 PM   Modules accepted: Orders

## 2022-12-05 ENCOUNTER — Other Ambulatory Visit: Payer: Self-pay | Admitting: Nurse Practitioner

## 2022-12-05 ENCOUNTER — Other Ambulatory Visit (HOSPITAL_COMMUNITY): Payer: Self-pay

## 2022-12-05 MED ORDER — REPATHA SURECLICK 140 MG/ML ~~LOC~~ SOAJ: 140.0000 mg | SUBCUTANEOUS | 3 refills | Status: DC

## 2022-12-05 NOTE — Addendum Note (Signed)
Addended by: Shaft Corigliano E on: 12/05/2022 07:18 AM   Modules accepted: Orders

## 2022-12-06 IMAGING — CT CT ABD-PELV W/ CM
2 of 5 series · 15 of 46 positions shown, 17 images · IV contrast (APPLIED)
Comparison: None.

CLINICAL DATA: Four week status post dilatation and curettage. Now
with abnormal, foul-smelling uterine bleeding, leukocytosis, and
low-grade fever.

EXAM:
CT ABDOMEN AND PELVIS WITH CONTRAST
TECHNIQUE: Multidetector CT imaging of the abdomen and pelvis was performed
using the standard protocol following bolus administration of
intravenous contrast.

[Series 3: abdomen 5.0 · axial · 0.82mm/px · z∈[-1266,-786]mm · 12 of 112 slices shown, 14 images]
[im 8/112  soft-tissue]
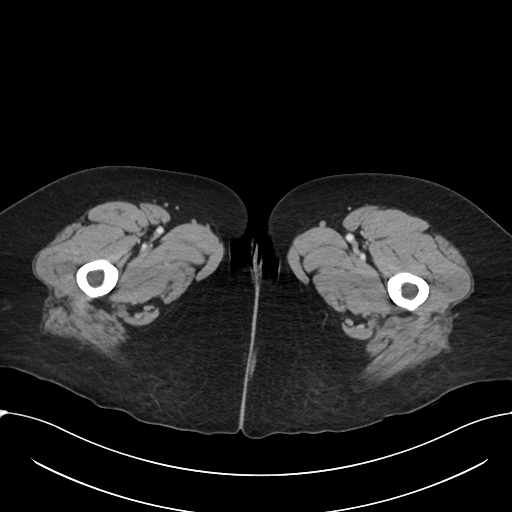
[im 8/112  bone]
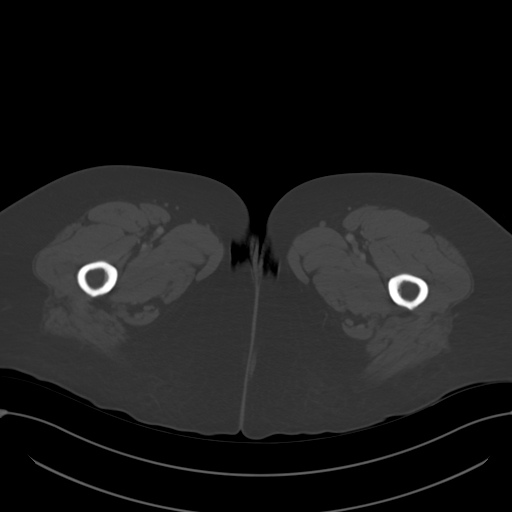
[im 16/112  soft-tissue]
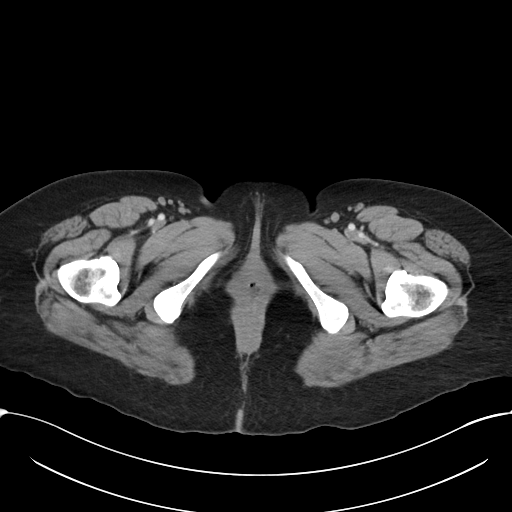
[im 24/112  soft-tissue]
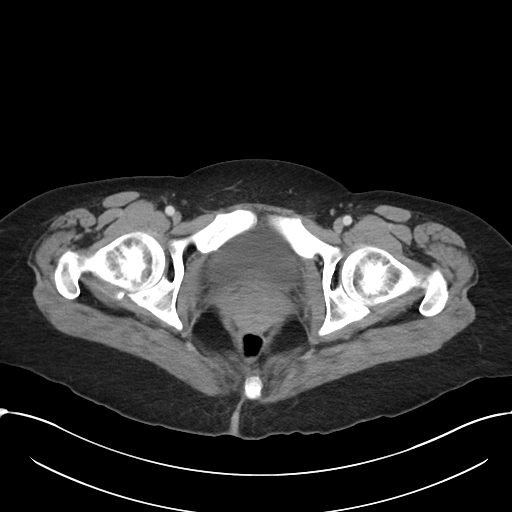
[im 32/112  soft-tissue]
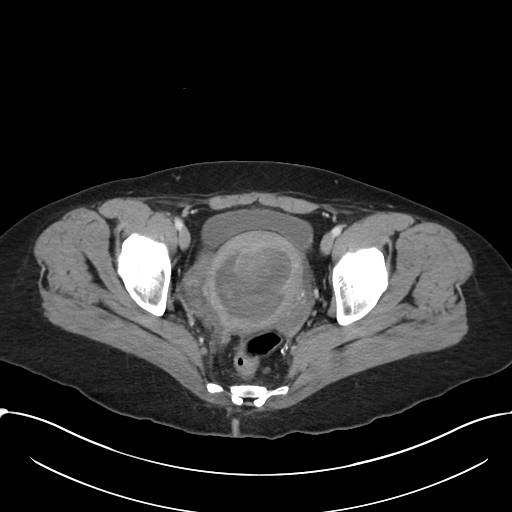
[im 40/112  soft-tissue]
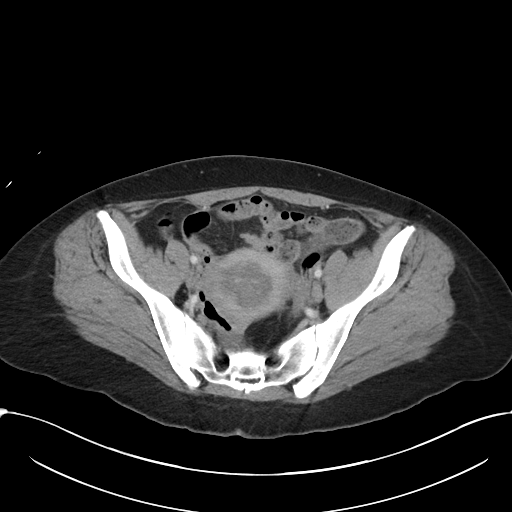
[im 48/112  soft-tissue]
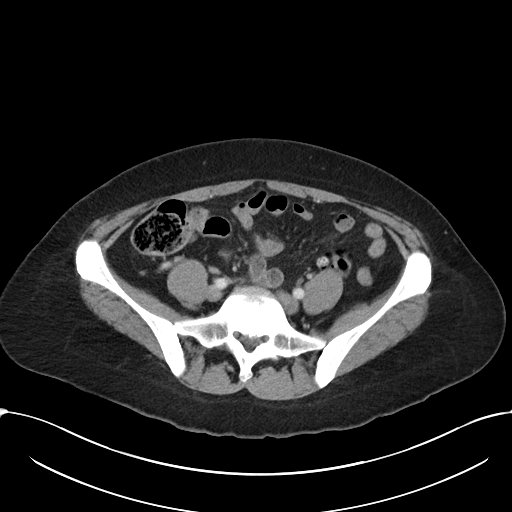
[im 64/112  soft-tissue]
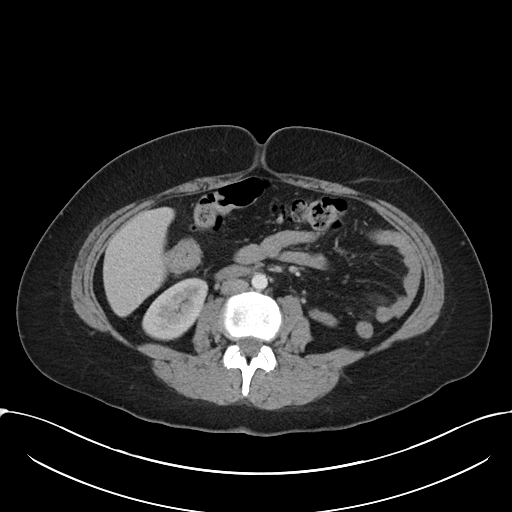
[im 72/112  soft-tissue]
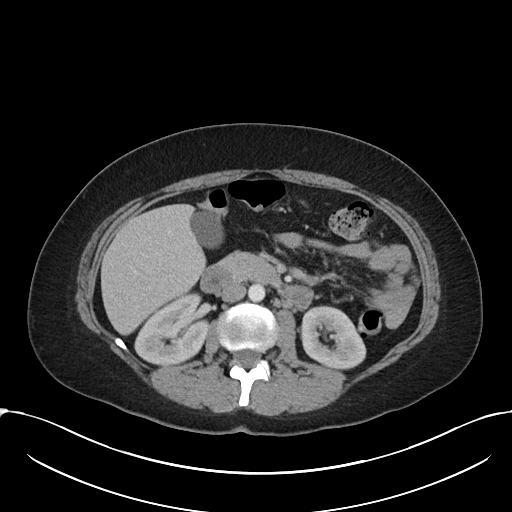
[im 80/112  soft-tissue]
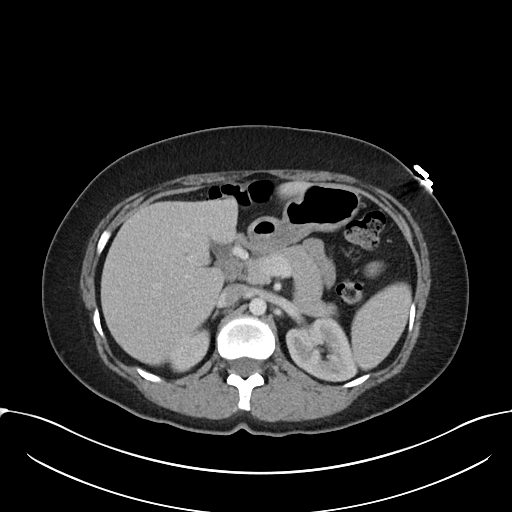
[im 80/112  bone]
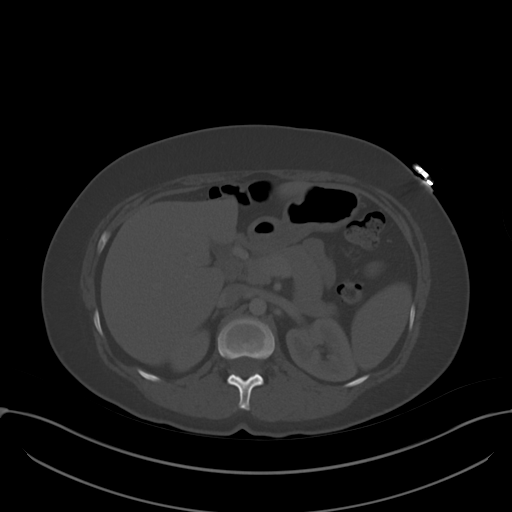
[im 88/112  soft-tissue]
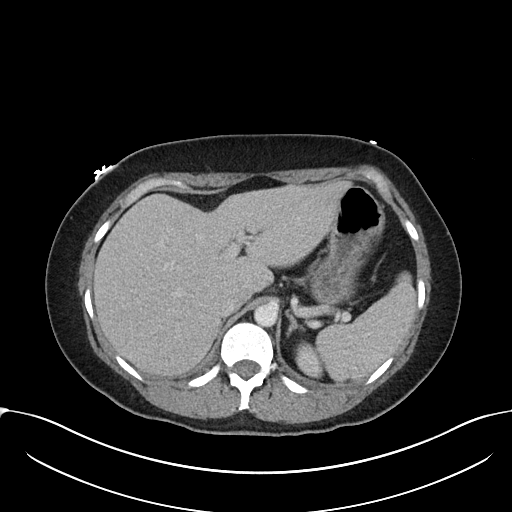
[im 96/112  soft-tissue]
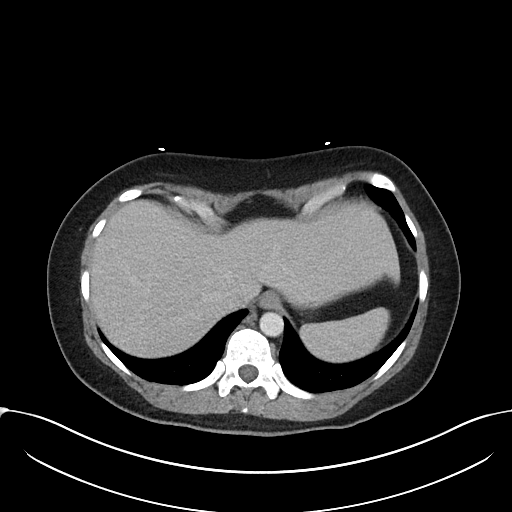
[im 104/112  soft-tissue]
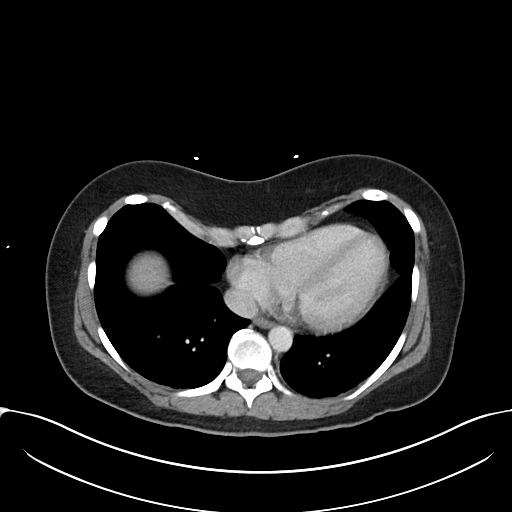

[Series 6: abdomen 3.0 mpr cor · coronal · 0.68mm/px · 3 of 88 slices shown]
[im 30/88  soft-tissue]
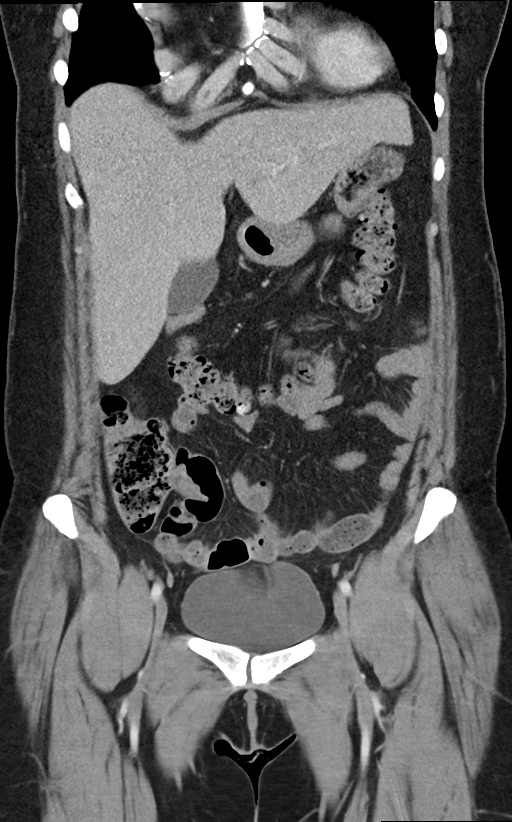
[im 39/88  soft-tissue]
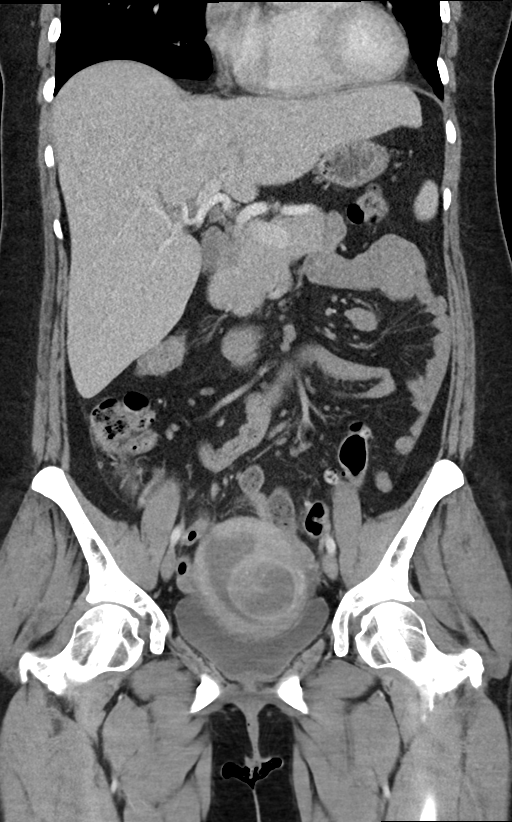
[im 49/88  soft-tissue]
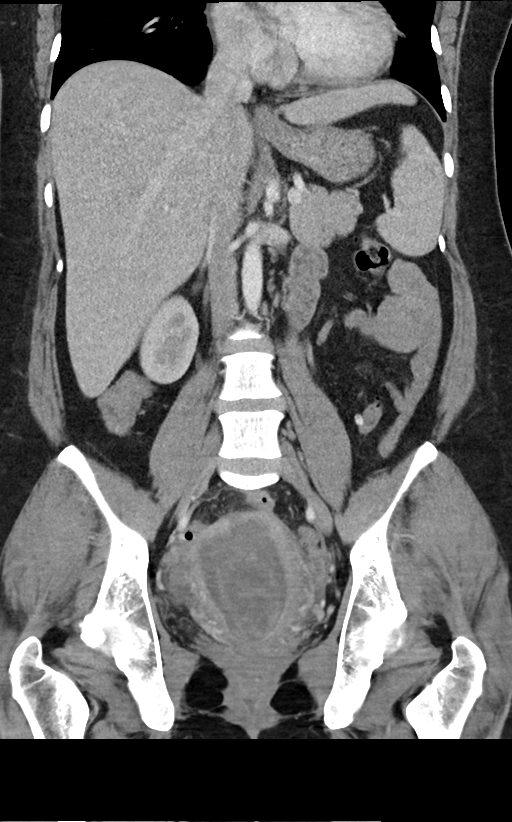

[15 of 46 positions shown; findings below may reference images not displayed]

RADIATION DOSE REDUCTION: This exam was performed according to the
departmental dose-optimization program which includes automated
exposure control, adjustment of the mA and/or kV according to
patient size and/or use of iterative reconstruction technique.

CONTRAST:  80mL OMNIPAQUE IOHEXOL 300 MG/ML  SOLN
FINDINGS: Lower chest: Subsegmental atelectasis or scar noted within the
inferior lingula.

Hepatobiliary: No focal liver abnormality is seen. No gallstones,
gallbladder wall thickening, or biliary dilatation.

Pancreas: Unremarkable. No pancreatic ductal dilatation or
surrounding inflammatory changes.

Spleen: Normal in size without focal abnormality.

Adrenals/Urinary Tract: Normal adrenal glands.

No kidney mass, nephrolithiasis or hydronephrosis identified
bilaterally. The bladder is unremarkable.

Stomach/Bowel: Stomach is within normal limits. Appendix appears
normal. No evidence of bowel wall thickening, distention, or
inflammatory changes.

Vascular/Lymphatic: No significant vascular findings are present. No
enlarged abdominal or pelvic lymph nodes.

Reproductive: The uterus is enlarged and there is diffuse
heterogeneous thickening of the endometrial cavity which measures
5.1 by 8.2 by 5.9 cm, image 55/7.

Other: There is complex fluid noted within the right and posterior
pelvis, image 78/3.

Musculoskeletal: No acute or significant osseous findings.
IMPRESSION: 1. The uterus it is enlarged with diffuse heterogeneous distension
of the endometrial cavity. In the setting of recent endometrial
instrumentation imaging findings are concerning for endometritis.
Underlying retained products of conception cannot be excluded.
2. Complex fluid noted within the right and posterior pelvis.
3. No extra uterine fluid collections identified.

## 2022-12-08 ENCOUNTER — Telehealth: Payer: Self-pay | Admitting: Pharmacy Technician

## 2022-12-08 ENCOUNTER — Other Ambulatory Visit (HOSPITAL_COMMUNITY): Payer: Self-pay

## 2022-12-08 NOTE — Telephone Encounter (Signed)
Pharmacy Patient Advocate Encounter  Insurance verification completed.    The patient is insured through [employer] test claim for repatha. Currently a quantity of 2 ML is a 28 day supply and the co-pay is $80.00 .   This test claim was processed through [facility]- copay amounts may vary at other pharmacies due to pharmacy/plan contracts, or as the patient moves through the different stages of their insurance plan.

## 2022-12-11 ENCOUNTER — Other Ambulatory Visit (HOSPITAL_COMMUNITY): Payer: Self-pay

## 2022-12-11 NOTE — Telephone Encounter (Signed)
I spoke with [facility] on [address] and was given the following information--Product service not covered.  Available at specialty plan network exclusion. Will forward to PharmD for follow up

## 2022-12-11 NOTE — Telephone Encounter (Signed)
Can you check into this?  Patient sent in message saying med still needs a PA

## 2022-12-11 NOTE — Telephone Encounter (Signed)
Test claim went through and co-pay is $80.00

## 2022-12-12 ENCOUNTER — Other Ambulatory Visit (HOSPITAL_COMMUNITY): Payer: Self-pay

## 2022-12-12 ENCOUNTER — Telehealth: Payer: Self-pay | Admitting: Pharmacy Technician

## 2022-12-12 NOTE — Telephone Encounter (Signed)
Pharmacy Patient Advocate Encounter   Received notification from Pt Calls Messages that prior authorization for repatha is required/requested.   Insurance verification completed.   The patient is insured through [facility] .   Per test claim: PA required; PA started via CoverMyMeds. KEY [id number] . Waiting for clinical questions to populate.

## 2022-12-12 NOTE — Telephone Encounter (Signed)
PA request has been Submitted. New Encounter created for follow up. For additional info see Pharmacy Prior Auth telephone encounter from 12/12/22.

## 2022-12-15 ENCOUNTER — Other Ambulatory Visit (HOSPITAL_COMMUNITY): Payer: Self-pay

## 2022-12-15 NOTE — Telephone Encounter (Signed)
Pharmacy Patient Advocate Encounter  Received notification from [facility] that Prior Authorization for repatha has been CANCELLED due to prior auth on file until 03/31/23. Attached media in chart   PA #/Case ID/Reference #: [id number]

## 2022-12-16 ENCOUNTER — Other Ambulatory Visit (HOSPITAL_COMMUNITY): Payer: Self-pay

## 2022-12-16 MED ORDER — REPATHA SURECLICK 140 MG/ML ~~LOC~~ SOAJ
140.0000 mg | SUBCUTANEOUS | 11 refills | Status: DC
  Filled 2022-12-16: qty 2, 28d supply, fill #0

## 2022-12-16 MED ORDER — REPATHA SURECLICK 140 MG/ML ~~LOC~~ SOAJ
140.0000 mg | SUBCUTANEOUS | 11 refills | Status: DC
  Filled 2022-12-16: qty 2, 28d supply, fill #0
  Filled 2023-01-08: qty 2, 28d supply, fill #1
  Filled 2023-02-05: qty 2, 28d supply, fill #2
  Filled 2023-03-05: qty 2, 28d supply, fill #3
  Filled 2023-04-01: qty 2, 28d supply, fill #4
  Filled 2023-04-30: qty 2, 28d supply, fill #5
  Filled 2023-05-28: qty 2, 28d supply, fill #6
  Filled 2023-06-25: qty 2, 28d supply, fill #7
  Filled 2023-07-27: qty 2, 28d supply, fill #8
  Filled 2023-08-20: qty 2, 28d supply, fill #9
  Filled 2023-09-17: qty 2, 28d supply, fill #10
  Filled 2023-10-15: qty 2, 28d supply, fill #11

## 2022-12-17 ENCOUNTER — Encounter: Payer: Self-pay | Admitting: Psychiatry

## 2022-12-18 MED ORDER — NURTEC 75 MG PO TBDP: 75.0000 mg | ORAL_TABLET | ORAL | 6 refills | Status: DC | PRN

## 2022-12-18 NOTE — Telephone Encounter (Signed)
I sent an rx for nurtec to her pharmacy,thanks

## 2022-12-26 ENCOUNTER — Encounter: Payer: Self-pay | Admitting: Family Medicine

## 2022-12-26 ENCOUNTER — Other Ambulatory Visit: Payer: Self-pay | Admitting: Endocrinology

## 2022-12-26 DIAGNOSIS — E049 Nontoxic goiter, unspecified: Secondary | ICD-10-CM

## 2022-12-30 NOTE — Progress Notes (Unsigned)
Referring:  Doreene Nest, NP [address] [place] [address],  [place] [address]  PCP: Doreene Nest, NP  Neurology was asked to evaluate Elaine [name], a 47 year old female for a chief complaint of headaches.  Our recommendations of care will be communicated by shared medical record.    CC:  headaches  History provided from self  Follow-up visit:  Prior visit: 06/30/2022  Brief HPI:   Elaine [name] is a 47 y.o. female with PMH of HLD, fibroids and alpha-1 antitrypsin deficiency who is being followed for migraine headaches with aura which began around age 52. MRI brain unremarkable, MRI c-spine mild disc bulging at C5-6 and C6-7 without nerve compression  At prior visit, reported continuation of Emgality after great benefit with only 1-2 migraines per month and started Nurtec for rescue.  Referred to PT for persistent cervicalgia.     Interval history:      Reports great improvement of migraine headaches since starting Emgality.  Currently experiencing 1-2 migraines per month, can have a little worsening with increased stress.  Tolerating Emgality well. Did trial Nurtec but went to sleep shortly after, unsure if beneficial. Continues to Zomig with benefit, will resolve after 1-1.5 hours. Has since completely tapered off topiramate, has noted improvement of numbness/tingling sensation and cognitive difficulties including language impairment.  Continues to have cervicalgia, does do weight lifting 3x per week and does routine neck stretching exercises. She would be interested in PT with dry needling for cervicalgia if this would be helpful.        Migraine days per month: 1-2 Headache free days per month: 28-29  Current Treatment: Abortive Nurtec  Preventative Emgality  Prior Therapies                                 Zomig 5 mg PRN - lack of efficacy Topamax 100 mg QHS -paresthesias and cognitive impairment Lexapro - weight gain Amitriptyline - weight  gain Ibuprofen Tylenol PM Imitrex - palpitations Relpax - drowsiness     LABS: CBC    Component Value Date/Time   WBC 11.7 (H) 10/21/2022 1117   WBC 14.5 (H) 09/24/2022 1116   RBC 4.48 10/21/2022 1117   HGB 14.3 10/21/2022 1117   HGB 14.5 02/28/2021 1030   HCT 41.2 10/21/2022 1117   HCT 43.0 02/28/2021 1030   PLT 317 10/21/2022 1117   PLT 368 02/28/2021 1030   MCV 92.0 10/21/2022 1117   MCV 90 02/28/2021 1030   MCH 31.9 10/21/2022 1117   MCHC 34.7 10/21/2022 1117   RDW 12.9 10/21/2022 1117   RDW 12.9 02/28/2021 1030   LYMPHSABS 2.7 10/21/2022 1117   MONOABS 0.6 10/21/2022 1117   EOSABS 0.1 10/21/2022 1117   BASOSABS 0.0 10/21/2022 1117      Latest Ref Rng & Units 11/28/2022   10:00 AM 10/21/2022   11:17 AM 09/24/2022   11:16 AM  CMP  Glucose 70 - 99 mg/dL  99  94   BUN 6 - 20 mg/dL  10  20   Creatinine 5.28 - 1.00 mg/dL  4.13  2.44   Sodium 010 - 145 mmol/L  140  137   Potassium 3.5 - 5.1 mmol/L  3.9  4.9   Chloride 98 - 111 mmol/L  107  102   CO2 22 - 32 mmol/L  27  27   Calcium 8.9 - 10.3 mg/dL  9.3  10.1   Total Protein 6.5 - 8.1 g/dL  6.4  7.0   Total Bilirubin 0.3 - 1.2 mg/dL  0.5  0.7   Alkaline Phos 38 - 126 U/L  64  68   AST 15 - 41 U/L  13  17   ALT 0 - 32 IU/L 13  13  16      IMAGING:  none   Current Outpatient Medications on File Prior to Visit  Medication Sig Dispense Refill   betamethasone dipropionate 0.05 % lotion 1 liberally Topical Daily     cetirizine (ZYRTEC) 10 MG chewable tablet Chew 10 mg by mouth daily.     Cholecalciferol (VITAMIN D3 PO) Take 1 capsule by mouth daily.     cyanocobalamin (VITAMIN B12) 500 MCG tablet Take by mouth.     Evolocumab (REPATHA SURECLICK) 140 MG/ML SOAJ Inject 140 mg into the skin every 14 (fourteen) days. 2 mL 11   fluticasone (FLONASE) 50 MCG/ACT nasal spray Place 2 sprays into both nostrils daily.     Galcanezumab-gnlm (EMGALITY) 120 MG/ML SOAJ Inject 120 mg into the skin every 30 (thirty) days. 1.12 mL  11   MAGNESIUM PO Take 1 capsule by mouth daily.     Melatonin 1 MG CAPS daily.     Polyethylene Glycol 3350 (MIRALAX PO) Take by mouth. Several times weekly     Rimegepant Sulfate (NURTEC) 75 MG TBDP Take 1 tablet (75 mg total) by mouth as needed. 8 tablet 6   rosuvastatin (CRESTOR) 10 MG tablet Take 1 tablet (10 mg total) by mouth daily. 90 tablet 2   No current facility-administered medications on file prior to visit.     Allergies: Allergies  Allergen Reactions   Other Nausea And Vomiting    Opioids   Cephalexin Other (See Comments)    Blisters in mouth    Zetia [Ezetimibe]     Shoulder pain   Penicillins Rash    Hasn't had since childhood Itching with Zosyn 05/2021    Family History: Migraine or other headaches in the family:  grandmother and daughter have migraines Aneurysms in a first degree relative:  no Brain tumors in the family:  no Other neurological illness in the family:   no  Past Medical History: Past Medical History:  Diagnosis Date   Abnormal uterine bleeding 12/27/2020   ADHD (attention deficit hyperactivity disorder)    Alpha-1-antitrypsin deficiency (HCC)    Anxiety    Arthritis    Asthma    Bulging lumbar disc    Diverticulitis    with sepsis   Diverticulosis    Endometritis 05/19/2021   Family history of adverse reaction to anesthesia    malignant Hyperthermia- father and sister   Fibroids    Vaginal   GERD (gastroesophageal reflux disease)    Giantism (HCC)    No compelte work up, but suspected   Headache    Heart murmur    " Muscial " murmer -   History of hiatal hernia    Hyperlipidemia    Malignant hyperthermia    Orthostatic hypotension    Psoriasis    Ruptured ovarian cyst 2004,2016   Seizures (HCC)    none since age 69   Thyroid disease    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    Past Surgical History Past Surgical History:  Procedure Laterality Date   BREAST BIOPSY Right 07/04/2022   [place] RT BREAST BX W LOC DEV 1ST LESION IMG BX SPEC US  GUIDE 07/04/2022 [facility]  COLONOSCOPY     COLONOSCOPY WITH PROPOFOL N/A 11/25/2022   Procedure: COLONOSCOPY WITH PROPOFOL;  Surgeon: Jenel Lucks, MD;  Location: [facility];  Service: Gastroenterology;  Laterality: N/A;   DENTAL SURGERY     DILITATION & CURRETTAGE/HYSTROSCOPY WITH HYDROTHERMAL ABLATION N/A 04/16/2021   Procedure: DILATATION & CURETTAGE/HYSTEROSCOPY WITH HYDROTHERMAL ABLATION;  Surgeon: Reva Bores, MD;  Location: [facility] OR;  Service: Gynecology;  Laterality: N/A;  rep will be here confirmed on 04/08/21 CS   LAPAROSCOPIC SIGMOID COLECTOMY  2013   POLYPECTOMY  11/25/2022   Procedure: POLYPECTOMY;  Surgeon: Jenel Lucks, MD;  Location: [facility];  Service: Gastroenterology;;    Social History: Social History   Tobacco Use   Smoking status: Former    Current packs/day: 0.25    Average packs/day: 0.3 packs/day for 30.7 years (7.7 ttl pk-yrs)    Types: Cigarettes    Start date: 1994   Smokeless tobacco: Never  Vaping Use   Vaping status: Never Used  Substance Use Topics   Alcohol use: Yes    Comment: ocassional- 1-2 drinks   Drug use: Never    ROS: Negative for fevers, chills. Positive for headaches. All other systems reviewed and negative unless stated otherwise in HPI.   Physical Exam:   Vital Signs: There were no vitals taken for this visit. GENERAL: well appearing,in no acute distress,alert SKIN:  Color, texture, turgor normal. No rashes or lesions HEAD:  Normocephalic/atraumatic. CV:  RRR RESP: Normal respiratory effort MSK: +tenderness to palpation over bilateral occiput, neck, and shoulders  NEUROLOGICAL: Mental Status: Alert, oriented to person, place and time,Follows commands Cranial Nerves: PERRL, visual fields intact to confrontation, extraocular movements intact, no facial droop or ptosis, hearing grossly intact, no dysarthria, palate elevate symmetrically, tongue protrudes midline, shoulder shrug intact and  symmetric Motor: muscle strength 5/5 both upper and lower extremities,no drift, normal tone Reflexes: 2+ bilateral upper extremities, brisk bilateral patellar reflexes without spread Sensation: decreased sensation light touch LUE and LLE Coordination: Finger-to- nose-finger intact bilaterally Gait: normal-based     IMPRESSION: 47 year old female with a history of HLD, fibroids, alpha 1 antitrypsin deficiency who presents for follow-up of migraines.  MRI brain normal.  MRI cervical showed mild disc bulging at C5-6 and C6-7 without any nerve compression.  Great improvement in migraine headaches since initiating Emgality. Use of Nurtec with benefit.    PLAN: -Prevention: Continue Emgality for migraine prevention -Rescue: Continue Nurtec 75 mg as needed -Referral placed to PT for cervicalgia -Next steps: consider zonisamide, qulipta for prevention    Follow-up in 6 months or call earlier if needed   I spent 21 minutes of face-to-face and non-face-to-face time with patient.  This included previsit chart review, lab review, study review, order entry, electronic health record documentation, patient education and discussion regarding the above and answered all other questions to patient's satisfaction  Ihor Austin, [facility] [address] [place], [place] [address]  Phone [phone] Fax [phone] Note: This document was prepared with digital dictation and possible smart phrase technology. Any transcriptional errors that result from this process are unintentional.

## 2022-12-31 ENCOUNTER — Ambulatory Visit: Payer: 59 | Admitting: Adult Health

## 2022-12-31 ENCOUNTER — Encounter: Payer: Self-pay | Admitting: Adult Health

## 2022-12-31 VITALS — BP 140/99 | HR 81 | Ht 71.0 in | Wt 214.0 lb

## 2022-12-31 DIAGNOSIS — M542 Cervicalgia: Secondary | ICD-10-CM

## 2022-12-31 DIAGNOSIS — G43109 Migraine with aura, not intractable, without status migrainosus: Secondary | ICD-10-CM

## 2022-12-31 NOTE — Patient Instructions (Signed)
Your Plan:  Continue Emgality monthly injection for migraine prevention  Continue Nurtec as needed for migraine rescue    Follow-up in 1 year or call earlier if needed     Thank you for coming to see [place] at [facility]. I hope we have been able to provide you high quality care today.  You may receive a patient satisfaction survey over the next few weeks. We would appreciate your feedback and comments so that we may continue to improve ourselves and the health of our patients.

## 2023-01-01 ENCOUNTER — Other Ambulatory Visit: Payer: 59

## 2023-01-05 ENCOUNTER — Other Ambulatory Visit: Payer: Self-pay | Admitting: Family Medicine

## 2023-01-05 ENCOUNTER — Ambulatory Visit
Admission: RE | Admit: 2023-01-05 | Discharge: 2023-01-05 | Disposition: A | Discharge status: Home / Self Care | Payer: 59 | Source: Ambulatory Visit | Attending: Family Medicine | Admitting: Family Medicine

## 2023-01-05 DIAGNOSIS — N6310 Unspecified lump in the right breast, unspecified quadrant: Secondary | ICD-10-CM

## 2023-01-06 ENCOUNTER — Ambulatory Visit
Admission: RE | Admit: 2023-01-06 | Discharge: 2023-01-06 | Disposition: A | Discharge status: Home / Self Care | Payer: 59 | Source: Ambulatory Visit | Attending: Endocrinology | Admitting: Endocrinology

## 2023-01-06 DIAGNOSIS — E049 Nontoxic goiter, unspecified: Secondary | ICD-10-CM

## 2023-02-05 ENCOUNTER — Ambulatory Visit: Payer: 59 | Admitting: Family Medicine

## 2023-02-05 ENCOUNTER — Other Ambulatory Visit: Payer: Self-pay

## 2023-02-05 ENCOUNTER — Encounter: Payer: Self-pay | Admitting: Family Medicine

## 2023-02-05 VITALS — BP 119/81 | HR 82 | Ht 70.5 in | Wt 208.4 lb

## 2023-02-05 DIAGNOSIS — Z01419 Encounter for gynecological examination (general) (routine) without abnormal findings: Secondary | ICD-10-CM | POA: Diagnosis not present

## 2023-02-05 NOTE — Progress Notes (Signed)
Subjective:     Elaine [name] is a 47 y.o. female and is here for a comprehensive physical exam. The patient reports problems - still having monthly cycles but they are mostly light .  The following portions of the patient's history were reviewed and updated as appropriate: allergies, current medications, past family history, past medical history, past social history, past surgical history, and problem list.  Review of Systems Pertinent items noted in HPI and remainder of comprehensive ROS otherwise negative.   Objective:    BP 119/81   Pulse 82   Ht 5' 10.5" (1.791 m)   Wt 208 lb 6.4 oz (94.5 kg)   BMI 29.48 kg/m  General appearance: alert, cooperative, and appears stated age Head: Normocephalic, without obvious abnormality, atraumatic Neck: supple, symmetrical, trachea midline Lungs:  normal effort Heart: regular rate and rhythm Abdomen: soft, non-tender; bowel sounds normal; no masses,  no organomegaly Extremities: extremities normal, atraumatic, no cyanosis or edema Skin: Skin color, texture, turgor normal. No rashes or lesions Neurologic: Grossly normal    Assessment:    Healthy female exam.      Plan:  Encounter for gynecological examination without abnormal finding - Mammogram and pap are up to date.  Return in 1 year (on 02/05/2024).    See After Visit Summary for Counseling Recommendations

## 2023-02-06 NOTE — Progress Notes (Unsigned)
Cardiology Office Note:  .   Date:  02/06/2023  ID:  Elaine [name], DOB [birth year], MRN [id number] PCP: Doreene Nest, NP  [facility] Providers Cardiologist:  Kristeen Miss, MD { Click to update primary MD,subspecialty MD or APP then REFRESH:1}   Patient Profile: .      PMH Familial hyperlipidemia Family history premature CAD Pectus excavatum Septic diverticulitis s/p colectomy Alpha 1 antitrypsin deficiency Chronic shortness of breath Coronary calcium score of 0 on 04/05/2018  Referred to cardiology and seen by Dr. Elease Hashimoto 04/05/2018.  She has a history of alpha 1 antitrypsin deficiency followed by pulmonology with chronic shortness of breath.  Coronary calcium score of 0 on 04/05/2018.  At office visit November 2022 she reported recent syncopal episode at 3 AM.  Woke up to use the bathroom, took perhaps 5 steps then woke up on the floor.  Her Apple Watch showed HR dropped into the 40s.  History of orthostatic hypotension.  Hydrates well plus electrolytes at least 1 daily.  Cardiac monitor 02/2021 revealed 1 episode of NSVT lasting 6 beats, predominant rhythm NSR with average HR 84 bpm.  She had a stress fracture in her right hip secondary to the syncopal episode.  She underwent uterine ablation which was complicated by infection and bleeding.  Decreased lung capacity felt to be secondary to pectus excavated him and scoliosis.  Last cardiology clinic visit was 08/27/2022 at which time she reported frequent tension rupture and hypomobile joints.  She did not complete workup for Ehlers-Danlos syndrome due to only 1 provider in the area, but PT told her she was pretty certain she had it.  She continued to have difficulty walking up inclines since hip fracture, is able to do 2 to 3% max grade on treadmill.  Lifts weights 3 days/week and does Pilates frequently.  Continues to get lightheaded when changing positions too quickly.  Gets short of breath initially with exercise which she reports  is chronic and stable.  SOB improved on Breo inhaler.  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       History of Present Illness: .   Elaine [name] is a *** 47 y.o. female ***   Discussed the use of AI scribe software for clinical note transcription with the patient, who gave verbal consent to proceed.   ROS: ***       Studies Reviewed: .        *** Risk Assessment/Calculations:   {Does this patient have ATRIAL FIBRILLATION?[phone]}         Physical Exam:   VS:  There were no vitals taken for this visit.   Wt Readings from Last 3 Encounters:  02/05/23 208 lb 6.4 oz (94.5 kg)  12/31/22 214 lb (97.1 kg)  11/25/22 212 lb 4.9 oz (96.3 kg)    GEN: Well nourished, well developed in no acute distress NECK: No JVD; No carotid bruits CARDIAC: ***RRR, no murmurs, rubs, gallops RESPIRATORY:  Clear to auscultation without rales, wheezing or rhonchi  ABDOMEN: Soft, non-tender, non-distended EXTREMITIES:  No edema; No deformity     ASSESSMENT AND PLAN: .    Hyperlipidemia LDL goal < 70: Likely familial hyperlipidemia. Lipid panel 11/28/22 total cholesterol 160, triglycerides 158, HDL 43, LDL 90 Chronic shortness of breath/Alpha 1 antitripsin defiency Orthostatic hypotension:     {Are you ordering a CV Procedure (e.g. stress test, cath, DCCV, TEE, etc)?   Press  F2        :[id number]}  Dispo: ***  Signed, Eligha Bridegroom, NP-C

## 2023-02-10 ENCOUNTER — Encounter: Payer: Self-pay | Admitting: Nurse Practitioner

## 2023-02-10 ENCOUNTER — Other Ambulatory Visit: Payer: Self-pay | Admitting: *Deleted

## 2023-02-10 ENCOUNTER — Ambulatory Visit: Payer: 59 | Attending: Nurse Practitioner | Admitting: Nurse Practitioner

## 2023-02-10 VITALS — BP 120/80 | HR 80 | Ht 70.5 in | Wt 212.0 lb

## 2023-02-10 DIAGNOSIS — Z79899 Other long term (current) drug therapy: Secondary | ICD-10-CM

## 2023-02-10 DIAGNOSIS — E782 Mixed hyperlipidemia: Secondary | ICD-10-CM | POA: Diagnosis not present

## 2023-02-10 DIAGNOSIS — E8801 Alpha-1-antitrypsin deficiency: Secondary | ICD-10-CM

## 2023-02-10 DIAGNOSIS — R601 Generalized edema: Secondary | ICD-10-CM

## 2023-02-10 DIAGNOSIS — R0602 Shortness of breath: Secondary | ICD-10-CM

## 2023-02-10 DIAGNOSIS — I951 Orthostatic hypotension: Secondary | ICD-10-CM

## 2023-02-10 NOTE — Patient Instructions (Signed)
Medication Instructions:   Your physician recommends that you continue on your current medications as directed. Please refer to the Current Medication list given to you today.   *If you need a refill on your cardiac medications before your next appointment, please call your pharmacy*   Lab Work:  Tomorrow at [facility] fasting after midnight.   If you have labs (blood work) drawn today and your tests are completely normal, you will receive your results only by: MyChart Message (if you have MyChart) OR A paper copy in the mail If you have any lab test that is abnormal or we need to change your treatment, we will call you to review the results.   Testing/Procedures:  None ordered.   Follow-Up: At [facility], you and your health needs are our priority.  As part of our continuing mission to provide you with exceptional heart care, we have created designated Provider Care Teams.  These Care Teams include your primary Cardiologist (physician) and Advanced Practice Providers (APPs -  Physician Assistants and Nurse Practitioners) who all work together to provide you with the care you need, when you need it.  We recommend signing up for the patient portal called "MyChart".  Sign up information is provided on this After Visit Summary.  MyChart is used to connect with patients for Virtual Visits (Telemedicine).  Patients are able to view lab/test results, encounter notes, upcoming appointments, etc.  Non-urgent messages can be sent to your provider as well.   To learn more about what you can do with MyChart, go to [email or web address].    Your next appointment:   1 year(s)  Provider:   Kristeen Miss, MD  or Lebron Conners   Other Instructions  Your physician wants you to follow-up in: 1 year.  You will receive a reminder letter in the mail two months in advance. If you don't receive a letter, please call our office to schedule the follow-up appointment.

## 2023-02-12 LAB — NMR, LIPOPROFILE
Cholesterol, Total: 107 mg/dL (ref 100–199)
HDL Particle Number: 43.3 umol/L (ref >=30.5)
HDL-C: 58 mg/dL (ref >39)
LDL Particle Number: <300 nmol/L (ref <1000)
LDL-C (NIH Calc): 29 mg/dL (ref 0–99)
LP-IR Score: 42 (ref <=45)
Small LDL Particle Number: 117 nmol/L (ref <=527)
Triglycerides: 107 mg/dL (ref 0–149)

## 2023-03-06 ENCOUNTER — Other Ambulatory Visit (HOSPITAL_COMMUNITY): Payer: Self-pay

## 2023-04-02 ENCOUNTER — Other Ambulatory Visit: Payer: Self-pay

## 2023-05-04 ENCOUNTER — Other Ambulatory Visit (HOSPITAL_COMMUNITY): Payer: Self-pay

## 2023-05-11 ENCOUNTER — Other Ambulatory Visit (HOSPITAL_COMMUNITY): Payer: Self-pay

## 2023-05-11 ENCOUNTER — Telehealth: Payer: Self-pay | Admitting: Nurse Practitioner

## 2023-05-11 ENCOUNTER — Telehealth: Payer: Self-pay | Admitting: Pharmacy Technician

## 2023-05-11 NOTE — Telephone Encounter (Signed)
 Pharmacy Patient Advocate Encounter   Received notification from Pt Calls Messages that prior authorization for repatha  is required/requested.   Insurance verification completed.   The patient is insured through [employer]  .   Per test claim: PA required; PA submitted to above mentioned insurance via CoverMyMeds Key/confirmation #/EOC [id number] Status is pending

## 2023-05-11 NOTE — Telephone Encounter (Signed)
 Prior auth for repatha  submitted today, status pending.

## 2023-05-11 NOTE — Telephone Encounter (Signed)
 Patient stopped by the front desk to update her insurance with and states that she will need to get a new pre authorization with [employer] for her repatha  prescription.  Please reach out and let the patient know once this pre authorization has been completed.  Thank you.

## 2023-05-11 NOTE — Telephone Encounter (Signed)
 PA request has been Submitted. New Encounter created for follow up. For additional info see Pharmacy Prior Auth telephone encounter from 05/11/23.

## 2023-05-14 ENCOUNTER — Other Ambulatory Visit (HOSPITAL_COMMUNITY): Payer: Self-pay

## 2023-05-14 NOTE — Telephone Encounter (Signed)
Pharmacy Patient Advocate Encounter  Received notification from [facility]  that Prior Authorization for repatha has been APPROVED from 05/13/23 to 03/30/2098 Pt just got 05/04/23 too soon until 06/02/23  PA #/Case ID/Reference #: [id number]

## 2023-05-19 ENCOUNTER — Other Ambulatory Visit (HOSPITAL_COMMUNITY): Payer: Self-pay

## 2023-05-19 ENCOUNTER — Encounter: Payer: Self-pay | Admitting: Adult Health

## 2023-05-19 ENCOUNTER — Telehealth: Payer: Self-pay

## 2023-05-19 NOTE — Telephone Encounter (Signed)
Pharmacy Patient Advocate Encounter   Received notification from Patient Advice Request messages that prior authorization for Emgality 120MG /ML auto-injectors (migraine) is required/requested.   Insurance verification completed.   The patient is insured through [facility]  .   Per test claim: PA required; PA submitted to above mentioned insurance via CoverMyMeds Key/confirmation #/EOC [id number] Status is pending

## 2023-05-20 ENCOUNTER — Other Ambulatory Visit (HOSPITAL_COMMUNITY): Payer: Self-pay

## 2023-05-20 NOTE — Telephone Encounter (Signed)
 Pharmacy Patient Advocate Encounter  Received notification from [facility] [place]  that Prior Authorization for Emgality has been APPROVED from 05/19/2023 to 03/30/2098

## 2023-05-26 DIAGNOSIS — I788 Other diseases of capillaries: Secondary | ICD-10-CM | POA: Diagnosis not present

## 2023-05-26 DIAGNOSIS — L573 Poikiloderma of Civatte: Secondary | ICD-10-CM | POA: Diagnosis not present

## 2023-05-26 DIAGNOSIS — D225 Melanocytic nevi of trunk: Secondary | ICD-10-CM | POA: Diagnosis not present

## 2023-05-26 DIAGNOSIS — L400 Psoriasis vulgaris: Secondary | ICD-10-CM | POA: Diagnosis not present

## 2023-06-16 ENCOUNTER — Encounter: Payer: Self-pay | Admitting: Adult Health

## 2023-06-16 ENCOUNTER — Telehealth: Payer: Self-pay

## 2023-06-16 ENCOUNTER — Other Ambulatory Visit (HOSPITAL_COMMUNITY): Payer: Self-pay

## 2023-06-16 NOTE — Telephone Encounter (Signed)
 Pharmacy Patient Advocate Encounter   Received notification from Patient Advice Request messages that prior authorization for Nurtec 75MG  dispersible tablets is required/requested.   Insurance verification completed.   The patient is insured through [facility]  .   Per test claim: PA required; PA submitted to above mentioned insurance via CoverMyMeds Key/confirmation #/EOC [id number] Status is pending

## 2023-06-17 ENCOUNTER — Other Ambulatory Visit (HOSPITAL_COMMUNITY): Payer: Self-pay

## 2023-06-17 NOTE — Telephone Encounter (Signed)
 Pharmacy Patient Advocate Encounter  Received notification from [facility]  that Prior Authorization for Nurtec has been APPROVED from 06/16/2023 to 03/30/2098. Ran test claim, Copay is $0. This test claim was processed through [facility]- copay amounts may vary at other pharmacies due to pharmacy/plan contracts, or as the patient moves through the different stages of their insurance plan.   PA #/Case ID/Reference #: [id number]

## 2023-07-06 ENCOUNTER — Ambulatory Visit
Admission: RE | Admit: 2023-07-06 | Discharge: 2023-07-06 | Disposition: A | Discharge status: Home / Self Care | Payer: 59 | Source: Ambulatory Visit | Attending: Family Medicine | Admitting: Family Medicine

## 2023-07-06 DIAGNOSIS — N6021 Fibroadenosis of right breast: Secondary | ICD-10-CM | POA: Diagnosis not present

## 2023-07-06 DIAGNOSIS — N6310 Unspecified lump in the right breast, unspecified quadrant: Secondary | ICD-10-CM

## 2023-07-13 ENCOUNTER — Encounter: Payer: Self-pay | Admitting: Family Medicine

## 2023-07-14 MED ORDER — MICONAZOLE NITRATE 2 % VA CREA: 1.0000 | TOPICAL_CREAM | Freq: Every day | VAGINAL | 0 refills | Status: DC

## 2023-07-14 MED ORDER — FLUCONAZOLE 150 MG PO TABS: 150.0000 mg | ORAL_TABLET | Freq: Every day | ORAL | 2 refills | Status: DC

## 2023-07-24 ENCOUNTER — Other Ambulatory Visit: Payer: Self-pay | Admitting: Adult Health

## 2023-07-27 ENCOUNTER — Encounter: Payer: Self-pay | Admitting: Adult Health

## 2023-07-27 ENCOUNTER — Other Ambulatory Visit: Payer: Self-pay | Admitting: *Deleted

## 2023-08-03 ENCOUNTER — Encounter (HOSPITAL_BASED_OUTPATIENT_CLINIC_OR_DEPARTMENT_OTHER): Payer: Self-pay

## 2023-08-03 DIAGNOSIS — L4050 Arthropathic psoriasis, unspecified: Secondary | ICD-10-CM | POA: Diagnosis not present

## 2023-08-03 DIAGNOSIS — M357 Hypermobility syndrome: Secondary | ICD-10-CM | POA: Diagnosis not present

## 2023-08-03 DIAGNOSIS — Z79899 Other long term (current) drug therapy: Secondary | ICD-10-CM | POA: Diagnosis not present

## 2023-08-03 DIAGNOSIS — M1990 Unspecified osteoarthritis, unspecified site: Secondary | ICD-10-CM | POA: Diagnosis not present

## 2023-08-03 MED ORDER — ROSUVASTATIN CALCIUM 10 MG PO TABS
10.0000 mg | ORAL_TABLET | Freq: Every day | ORAL | 3 refills | Status: AC
  Filled 2024-05-06: qty 90, 90d supply, fill #0

## 2023-08-05 ENCOUNTER — Other Ambulatory Visit: Payer: Self-pay | Admitting: Rheumatology

## 2023-08-05 DIAGNOSIS — M545 Low back pain, unspecified: Secondary | ICD-10-CM

## 2023-08-05 DIAGNOSIS — M1990 Unspecified osteoarthritis, unspecified site: Secondary | ICD-10-CM

## 2023-08-20 ENCOUNTER — Encounter: Payer: Self-pay | Admitting: Rheumatology

## 2023-08-25 ENCOUNTER — Ambulatory Visit
Admission: RE | Admit: 2023-08-25 | Discharge: 2023-08-25 | Disposition: A | Discharge status: Home / Self Care | Source: Ambulatory Visit | Attending: Rheumatology | Admitting: Rheumatology

## 2023-08-25 DIAGNOSIS — M545 Low back pain, unspecified: Secondary | ICD-10-CM

## 2023-08-25 DIAGNOSIS — M5126 Other intervertebral disc displacement, lumbar region: Secondary | ICD-10-CM | POA: Diagnosis not present

## 2023-08-25 DIAGNOSIS — M1990 Unspecified osteoarthritis, unspecified site: Secondary | ICD-10-CM

## 2023-08-25 MED ORDER — GADOPICLENOL 0.5 MMOL/ML IV SOLN
10.0000 mL | Freq: Once | INTRAVENOUS | Status: AC | PRN
  Administered 2023-08-25: 10 mL via INTRAVENOUS

## 2023-09-08 ENCOUNTER — Encounter: Payer: Self-pay | Admitting: Adult Health

## 2023-09-09 MED ORDER — ZOLMITRIPTAN 5 MG PO TABS: 5.0000 mg | ORAL_TABLET | ORAL | 11 refills | Status: DC | PRN

## 2023-09-14 ENCOUNTER — Other Ambulatory Visit (INDEPENDENT_AMBULATORY_CARE_PROVIDER_SITE_OTHER): Payer: Self-pay

## 2023-09-14 ENCOUNTER — Ambulatory Visit: Admitting: Orthopedic Surgery

## 2023-09-14 VITALS — BP 128/85 | HR 94 | Ht 70.0 in | Wt 216.0 lb

## 2023-09-14 DIAGNOSIS — M545 Low back pain, unspecified: Secondary | ICD-10-CM

## 2023-09-14 NOTE — Progress Notes (Signed)
 Orthopedic Spine Surgery Office Note  Assessment: Patient is a 48 y.o. female with left sided lower lumbar pain. No radicular symptoms. No significant stenosis seen on MRI   Plan: -Patient has tried PT, Tylenol , Aleve, oral steroids, gabapentin  -Recommended desensitization. Can apply topical lidocaine  or voltaren  gel and increase pressure applied to the area over time, Should do this daily for 5-53min. This will take several weeks to see if it improves. Next steps would be trial of lyrica or injection to the left L5/S1 facet joint  -Patient should return to office on an as needed basis   Patient expressed understanding of the plan and all questions were answered to the patient's satisfaction.   ___________________________________________________________________________   History:  Patient is a 48 y.o. female who presents today for lumbar spine.  Patient has had 2 years of left-sided low back pain.  She feels in the lower lumbar region in the area of the paraspinal muscles.  She notes it worse with pressure in the area.  She said sometimes even her clothing touching that area for a while can cause pain.  Belts are difficult as a result as well.  She does not have any pain radiating into either lower extremity.  There is no trauma or injury that preceded the onset of the pain.  She has no weakness in either lower extremity.  No bowel or bladder incontinence.  No saddle anesthesia.  Treatments tried: PT, Tylenol , Aleve, oral steroids, gabapentin  Review of systems: Denies fevers and chills, night sweats, unexplained weight loss, history of cancer.  Has had pain that wakes her at night  Past medical history: Alpha-1 antitrypsin deficiency Anxiety Diverticulitis GERD Malignant hyperthermia Psoriasis HLD Hypermobility Chari malformation Migraines  Allergies: opioids, cephalexin, zetia , penicillin  Past surgical history:  D&C of uterus with ablation Polypectomy Sigmoid  colectomy  Social history: Denies use of nicotine product (smoking, vaping, patches, smokeless) Alcohol use: Yes, approximately 1-2 drinks per week Denies recreational drug use   Physical Exam:  BMI of 31.0  General: no acute distress, appears stated age Neurologic: alert, answering questions appropriately, following commands Respiratory: unlabored breathing on room air, symmetric chest rise Psychiatric: appropriate affect, normal cadence to speech   MSK (spine):  -Strength exam      Left  Right EHL    5/5  5/5 TA    5/5  5/5 GSC    5/5  5/5 Knee extension  5/5  5/5 Hip flexion   5/5  5/5  -Sensory exam    Sensation intact to light touch in L3-S1 nerve distributions of bilateral lower extremities  -Achilles DTR: 2/4 on the left, 2/4 on the right -Patellar tendon DTR: 2/4 on the left, 2/4 on the right  -Straight leg raise: Negative bilaterally -Clonus: no beats bilaterally  -Left hip exam: No increased pain with range of motion, positive FADIR, negative FABER, negative SI joint compression test -Right hip exam: No pain through range of motion, negative Stinchfield, negative FADIR, negative SI joint compression test  -Winces, groans, and withdraws from pain with superficial palpation over the left lower lumbar paraspinal muscles  Imaging: XRs of the lumbar spine from 09/14/2023 were independently reviewed and interpreted, showing no significant degenerative changes.  No fracture or dislocation seen.  No evidence of instability on flexion/extension views.  MRI of the lumbar spine from 08/25/2023 was independently reviewed and interpreted, showing disc desiccation at L5/S1.  No other DDD.  No significant central, lateral recess, or foraminal stenosis seen. Small increased  area of T2 signal around the left L5/S1 facet joint.    Patient name: Elaine [name] Patient MRN: [id number] Date of visit: 09/14/23

## 2023-10-19 ENCOUNTER — Other Ambulatory Visit (HOSPITAL_COMMUNITY): Payer: Self-pay

## 2023-11-12 ENCOUNTER — Other Ambulatory Visit: Payer: Self-pay | Admitting: Nurse Practitioner

## 2023-11-12 ENCOUNTER — Other Ambulatory Visit (HOSPITAL_COMMUNITY): Payer: Self-pay

## 2023-11-12 DIAGNOSIS — M1990 Unspecified osteoarthritis, unspecified site: Secondary | ICD-10-CM | POA: Diagnosis not present

## 2023-11-12 DIAGNOSIS — L4050 Arthropathic psoriasis, unspecified: Secondary | ICD-10-CM | POA: Diagnosis not present

## 2023-11-12 DIAGNOSIS — M357 Hypermobility syndrome: Secondary | ICD-10-CM | POA: Diagnosis not present

## 2023-11-12 DIAGNOSIS — Z79899 Other long term (current) drug therapy: Secondary | ICD-10-CM | POA: Diagnosis not present

## 2023-11-12 MED ORDER — REPATHA SURECLICK 140 MG/ML ~~LOC~~ SOAJ
140.0000 mg | SUBCUTANEOUS | 11 refills | Status: AC
  Filled 2023-11-12: qty 2, 28d supply, fill #0
  Filled 2023-12-10: qty 2, 28d supply, fill #1
  Filled 2024-01-04: qty 2, 28d supply, fill #2
  Filled 2024-02-04: qty 2, 28d supply, fill #3
  Filled 2024-03-04: qty 2, 28d supply, fill #4
  Filled 2024-05-01: qty 2, 28d supply, fill #5

## 2023-12-17 DIAGNOSIS — M25552 Pain in left hip: Secondary | ICD-10-CM | POA: Diagnosis not present

## 2023-12-17 DIAGNOSIS — M545 Low back pain, unspecified: Secondary | ICD-10-CM | POA: Diagnosis not present

## 2023-12-30 NOTE — Progress Notes (Unsigned)
 Referring:  Gretta Comer POUR, NP [address] [place],  [place] [address]  PCP: Gretta Comer POUR, NP    CC:  headaches Chief Complaint  Patient presents with   Migraine    Rm 3 alone  Pt is well, reports she has been having daily headaches. She has about 5-7 migraines a month. Zomig  and Nurtec does help.      History provided from self  Follow-up visit:  Prior visit: 12/31/2022  Brief HPI:   Elaine [name] is a 48 y.o. female with PMH of HLD, fibroids and alpha-1 antitrypsin deficiency who is being followed for migraine headaches with aura which began around age 62. MRI brain unremarkable, MRI c-spine mild disc bulging at C5-6 and C6-7 without nerve compression  At prior visit, reported continuation of Emgality  after great benefit with only 1-2 migraines per month and Nurtec for rescue.    In the interval time, patient sent MyChart message in June reporting increased migraine headaches which she attributed to frequent storms and pressure changes.  Reported lack of benefit with Nurtec and retried her old prescription with Zomig  which resolved migraine and requested refill with Zomig .    Interval history:  Returns today for follow-up visit.  Unfortunately, migraines have persisted at increased frequency since June.  She reports having about 5-7 migraines per month, typically starts behind her eye, right sided and occasionally left-sided.  She can have vision changes with her migraines more so affecting her peripheral vision which resolves after migraine resolves.  She has also been experiencing almost daily tension type headaches located occipital and frontal with bands/pressure sensation typically occurring in the evening.  She was diagnosed with psoriatic arthritis back in November, she recently started on new treatment last month and on daily prednisone  since initial diagnosis.  She has also been having increased hip pain and may possibly need replacement.  Endorses  increased stress with her husband's job, she has been picking up  more shifts at [employer] which she is still enjoying. She remains on Emgality  monthly injection, last injection last week.  Use of Zomig  typically with benefit, occasional use of Nurtec.      Migraine days per month: 5-7  Current Treatment: Abortive Nurtec Zomig   Preventative Emgality   Prior Therapies                                 Topamax  100 mg QHS -paresthesias and cognitive impairment Lexapro - weight gain Amitriptyline - weight gain Emgality  Aimovig contraindicated due to constipation and GI issues   Zomig  Imitrex - palpitations Relpax - drowsiness     LABS: CBC    Component Value Date/Time   WBC 11.7 (H) 10/21/2022 1117   WBC 14.5 (H) 09/24/2022 1116   RBC 4.48 10/21/2022 1117   HGB 14.3 10/21/2022 1117   HGB 14.5 02/28/2021 1030   HCT 41.2 10/21/2022 1117   HCT 43.0 02/28/2021 1030   PLT 317 10/21/2022 1117   PLT 368 02/28/2021 1030   MCV 92.0 10/21/2022 1117   MCV 90 02/28/2021 1030   MCH 31.9 10/21/2022 1117   MCHC 34.7 10/21/2022 1117   RDW 12.9 10/21/2022 1117   RDW 12.9 02/28/2021 1030   LYMPHSABS 2.7 10/21/2022 1117   MONOABS 0.6 10/21/2022 1117   EOSABS 0.1 10/21/2022 1117   BASOSABS 0.0 10/21/2022 1117      Latest Ref Rng & Units 11/28/2022   10:00  AM 10/21/2022   11:17 AM 09/24/2022   11:16 AM  CMP  Glucose 70 - 99 mg/dL  99  94   BUN 6 - 20 mg/dL  10  20   Creatinine 9.55 - 1.00 mg/dL  9.30  9.19   Sodium 864 - 145 mmol/L  140  137   Potassium 3.5 - 5.1 mmol/L  3.9  4.9   Chloride 98 - 111 mmol/L  107  102   CO2 22 - 32 mmol/L  27  27   Calcium  8.9 - 10.3 mg/dL  9.3  89.8   Total Protein 6.5 - 8.1 g/dL  6.4  7.0   Total Bilirubin 0.3 - 1.2 mg/dL  0.5  0.7   Alkaline Phos 38 - 126 U/L  64  68   AST 15 - 41 U/L  13  17   ALT 0 - 32 IU/L 13  13  16      IMAGING:  none   Current Outpatient Medications on File Prior to Visit  Medication Sig Dispense Refill    betamethasone dipropionate 0.05 % lotion 1 liberally Topical Daily     cetirizine (ZYRTEC) 10 MG chewable tablet Chew 10 mg by mouth daily.     Cholecalciferol (VITAMIN D3 PO) Take 1 capsule by mouth daily.     COSENTYX SENSOREADY, 300 MG, 150 MG/ML SOAJ      cyanocobalamin (VITAMIN B12) 500 MCG tablet Take by mouth.     Evolocumab  (REPATHA  SURECLICK) 140 MG/ML SOAJ Inject 140 mg into the skin every 14 (fourteen) days. 2 mL 11   fluconazole  (DIFLUCAN ) 150 MG tablet Take 1 tablet (150 mg total) by mouth daily. Repeat in 24 hours if needed 2 tablet 2   MAGNESIUM PO Take 1 capsule by mouth daily.     Melatonin 1 MG CAPS daily.     miconazole  (MICONAZOLE  7) 2 % vaginal cream Place 1 Applicatorful vaginally at bedtime. 45 g 0   Polyethylene Glycol 3350  (MIRALAX  PO) Take by mouth. Several times weekly     rosuvastatin  (CRESTOR ) 10 MG tablet Take 1 tablet (10 mg total) by mouth daily. 90 tablet 3   zolmitriptan  (ZOMIG ) 5 MG tablet Take 1 tablet (5 mg total) by mouth as needed for migraine. 10 tablet 11   No current facility-administered medications on file prior to visit.     Allergies: Allergies  Allergen Reactions   Other Nausea And Vomiting    Opioids   Cephalexin Other (See Comments)    Blisters in mouth    Zetia  [Ezetimibe ]     Shoulder pain   Penicillins Rash    Hasn't had since childhood Itching with Zosyn  05/2021    Family History: Migraine or other headaches in the family:  grandmother and daughter have migraines Aneurysms in a first degree relative:  no Brain tumors in the family:  no Other neurological illness in the family:   no  Past Medical History: Past Medical History:  Diagnosis Date   Abnormal uterine bleeding 12/27/2020   ADHD (attention deficit hyperactivity disorder)    Alpha-1-antitrypsin deficiency (HCC)    Anxiety    Arthritis    Asthma    Bulging lumbar disc    Diverticulitis    with sepsis   Diverticulosis    Endometritis 05/19/2021   Family  history of adverse reaction to anesthesia    malignant Hyperthermia- father and sister   Fibroids    Vaginal   GERD (gastroesophageal reflux disease)    Giantism (  HCC)    No compelte work up, but suspected   Headache    Heart murmur     Muscial  murmer -   History of hiatal hernia    Hyperlipidemia    Malignant hyperthermia    Orthostatic hypotension    Psoriasis    Ruptured ovarian cyst 2004,2016   Seizures (HCC)    none since age 58   Thyroid  disease    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     Past Surgical History Past Surgical History:  Procedure Laterality Date   BREAST BIOPSY Right 07/04/2022   US  RT BREAST BX W LOC DEV 1ST LESION IMG BX SPEC US  GUIDE 07/04/2022 [facility]   COLONOSCOPY     COLONOSCOPY WITH PROPOFOL  N/A 11/25/2022   Procedure: COLONOSCOPY WITH PROPOFOL ;  Surgeon: Stacia Glendia BRAVO, MD;  Location: [facility];  Service: Gastroenterology;  Laterality: N/A;   DENTAL SURGERY     DILITATION & CURRETTAGE/HYSTROSCOPY WITH HYDROTHERMAL ABLATION N/A 04/16/2021   Procedure: DILATATION & CURETTAGE/HYSTEROSCOPY WITH HYDROTHERMAL ABLATION;  Surgeon: Fredirick Glenys RAMAN, MD;  Location: [facility];  Service: Gynecology;  Laterality: N/A;  rep will be here confirmed on 04/08/21 CS   LAPAROSCOPIC SIGMOID COLECTOMY  2013   POLYPECTOMY  11/25/2022   Procedure: POLYPECTOMY;  Surgeon: Stacia Glendia BRAVO, MD;  Location: [facility];  Service: Gastroenterology;;    Social History: Social History   Tobacco Use   Smoking status: Former    Current packs/day: 0.25    Average packs/day: 0.3 packs/day for 31.8 years (7.9 ttl pk-yrs)    Types: Cigarettes    Start date: 1994   Smokeless tobacco: Never  Vaping Use   Vaping status: Never Used  Substance Use Topics   Alcohol use: Yes    Comment: ocassional- 1-2 drinks   Drug use: Never    ROS: Negative for fevers, chills. Positive for headaches. All other systems reviewed and negative unless stated otherwise in HPI.   Physical Exam:    Vital Signs: BP 128/83   Pulse 78   Ht 5' 11 (1.803 m)   Wt 213 lb (96.6 kg)   BMI 29.71 kg/m  GENERAL: well appearing,in no acute distress,alert  NEUROLOGICAL: Mental Status: Alert, oriented to person, place and time,Follows commands Cranial Nerves: PERRL, visual fields intact to confrontation, extraocular movements intact, no facial droop or ptosis, hearing grossly intact, no dysarthria, palate elevate symmetrically, tongue protrudes midline, shoulder shrug intact and symmetric Motor: muscle strength 5/5 both upper and lower extremities,no drift, normal tone Reflexes: 2+ bilateral upper extremities, brisk bilateral patellar reflexes without spread Sensation: decreased sensation light touch LUE and LLE Coordination: Finger-to- nose-finger intact bilaterally Gait: Antalgic, mild favoring on left side d/t hip pain     IMPRESSION: 48 year old female with a history of HLD, fibroids, alpha 1 antitrypsin deficiency who presents for follow-up of migraines.  MRI brain normal.  MRI cervical showed mild disc bulging at C5-6 and C6-7 without any nerve compression.  Migraines initially greatly improved on Emgality  but has been worsening over the past 4 months with now 5-7 migraines per month and almost daily tension type headaches.  She was diagnosed with psoriatic arthritis back in November.  She deals with chronic low back and neck pain and more recent left hip pain.  Endorses increased family stressors.  Concern all of this may be contributing especially to tension headaches but has Emgality  does not appear to be as effective at this point, she is interested in switching therapy.  PLAN: -Prevention: will start Ajovy monthly injection -Rescue: Continue Zomig  or Nurtec 75 mg as needed. Start baclofen 10mg  TID PRN - Continue to follow with rheumatology for psoriatic arthritis - Continue to follow with orthopedics as scheduled    Follow-up in 3 months or call earlier if needed    I  personally spent a total of 40 minutes in the care of the patient today including preparing to see the patient, performing a medically appropriate exam/evaluation, counseling and educating, placing orders, and documenting clinical information in the EHR.   Harlene Bogaert, AGNP-BC  [facility] [address] [place], [place] [address]  Phone [phone] Fax [phone] Note: This document was prepared with digital dictation and possible smart phrase technology. Any transcriptional errors that result from this process are unintentional.

## 2023-12-31 ENCOUNTER — Ambulatory Visit: Payer: 59 | Admitting: Adult Health

## 2023-12-31 ENCOUNTER — Encounter: Payer: Self-pay | Admitting: Adult Health

## 2023-12-31 ENCOUNTER — Other Ambulatory Visit (HOSPITAL_COMMUNITY): Payer: Self-pay

## 2023-12-31 ENCOUNTER — Telehealth: Payer: Self-pay | Admitting: Pharmacist

## 2023-12-31 VITALS — BP 128/83 | HR 78 | Ht 71.0 in | Wt 213.0 lb

## 2023-12-31 DIAGNOSIS — R519 Headache, unspecified: Secondary | ICD-10-CM

## 2023-12-31 DIAGNOSIS — G43109 Migraine with aura, not intractable, without status migrainosus: Secondary | ICD-10-CM | POA: Diagnosis not present

## 2023-12-31 MED ORDER — NURTEC 75 MG PO TBDP: 75.0000 mg | ORAL_TABLET | ORAL | 11 refills | Status: DC | PRN

## 2023-12-31 MED ORDER — BACLOFEN 10 MG PO TABS: 10.0000 mg | ORAL_TABLET | Freq: Three times a day (TID) | ORAL | 11 refills | Status: AC | PRN

## 2023-12-31 MED ORDER — AJOVY 225 MG/1.5ML ~~LOC~~ SOAJ: 225.0000 mg | SUBCUTANEOUS | 11 refills | Status: DC

## 2023-12-31 NOTE — Telephone Encounter (Signed)
 Pharmacy Patient Advocate Encounter  Insurance verification completed.   The patient is insured through [employer] [place]   Ran test claim for [employer]	[place]. Currently a quantity of 16 is a 30 day supply and the co-pay is 0.00 . The current 30 day co-pay is, $0.00.  No PA needed at this time.  This test claim was processed through [facility]- copay amounts may vary at other pharmacies due to pharmacy/plan contracts, or as the patient moves through the different stages of their insurance plan.   Spoke with pharmacy, need to process 16 for a 30 day supply.  [facility] [id number]

## 2023-12-31 NOTE — Patient Instructions (Addendum)
 Your Plan:  Will switch to Ajovy monthly injection for migraine prevention  If migraines persist after a few injections, please let me know!   Continue Nurtec and Zomig  as needed for rescue   Start baclofen as needed      Follow up in 3 months or call earlier if needed      Thank you for coming to see us  at [facility]. I hope we have been able to provide you high quality care today.  You may receive a patient satisfaction survey over the next few weeks. We would appreciate your feedback and comments so that we may continue to improve ourselves and the health of our patients.

## 2024-01-04 DIAGNOSIS — M25552 Pain in left hip: Secondary | ICD-10-CM | POA: Diagnosis not present

## 2024-01-08 ENCOUNTER — Other Ambulatory Visit (HOSPITAL_COMMUNITY): Payer: Self-pay

## 2024-01-19 ENCOUNTER — Encounter: Payer: Self-pay | Admitting: Adult Health

## 2024-01-19 MED ORDER — AJOVY 225 MG/1.5ML ~~LOC~~ SOAJ: 225.0000 mg | Freq: Once | SUBCUTANEOUS | Status: AC

## 2024-01-20 ENCOUNTER — Other Ambulatory Visit (HOSPITAL_COMMUNITY): Payer: Self-pay

## 2024-01-20 ENCOUNTER — Telehealth: Payer: Self-pay

## 2024-01-20 NOTE — Telephone Encounter (Signed)
 Pharmacy Patient Advocate Encounter   Received notification from Patient Advice Request messages that prior authorization for AJOVY (fremanezumab-vfrm) injection 225MG /1.5ML auto-injectors is required/requested.   Insurance verification completed.   The patient is insured through [employer] **.   Per test claim: PA required; PA submitted to above mentioned insurance via Latent Key/confirmation #/EOC [id number] Status is pending

## 2024-01-20 NOTE — Telephone Encounter (Signed)
 PA request has been Submitted. New Encounter has been or will be created for follow up. For additional info see Pharmacy Prior Auth telephone encounter from 01-20-2024.

## 2024-01-22 NOTE — Telephone Encounter (Signed)
 Pharmacy Patient Advocate Encounter  Received notification from [facility] ** that Prior Authorization for AJOVY (fremanezumab-vfrm) injection 225MG /1.5ML auto-injectors has been APPROVED from 01-21-2024 to 01-20-2025   PA #/Case ID/Reference #: [id number]

## 2024-01-25 ENCOUNTER — Other Ambulatory Visit: Payer: Self-pay | Admitting: Endocrinology

## 2024-01-25 DIAGNOSIS — E049 Nontoxic goiter, unspecified: Secondary | ICD-10-CM

## 2024-02-01 ENCOUNTER — Encounter: Payer: Self-pay | Admitting: Radiology

## 2024-02-01 ENCOUNTER — Ambulatory Visit
Admission: RE | Admit: 2024-02-01 | Discharge: 2024-02-01 | Disposition: A | Discharge status: Home / Self Care | Source: Ambulatory Visit | Attending: Endocrinology | Admitting: Endocrinology

## 2024-02-01 DIAGNOSIS — Z79899 Other long term (current) drug therapy: Secondary | ICD-10-CM | POA: Diagnosis not present

## 2024-02-01 DIAGNOSIS — E049 Nontoxic goiter, unspecified: Secondary | ICD-10-CM

## 2024-02-01 DIAGNOSIS — E041 Nontoxic single thyroid nodule: Secondary | ICD-10-CM | POA: Diagnosis not present

## 2024-02-01 DIAGNOSIS — L4050 Arthropathic psoriasis, unspecified: Secondary | ICD-10-CM | POA: Diagnosis not present

## 2024-02-15 ENCOUNTER — Other Ambulatory Visit (HOSPITAL_COMMUNITY)
Admission: RE | Admit: 2024-02-15 | Discharge: 2024-02-15 | Disposition: A | Discharge status: Home / Self Care | Source: Ambulatory Visit | Attending: Family Medicine | Admitting: Family Medicine

## 2024-02-15 ENCOUNTER — Ambulatory Visit: Admitting: Family Medicine

## 2024-02-15 ENCOUNTER — Other Ambulatory Visit: Payer: Self-pay

## 2024-02-15 ENCOUNTER — Encounter: Payer: Self-pay | Admitting: Family Medicine

## 2024-02-15 VITALS — BP 132/86 | HR 70 | Wt 215.0 lb

## 2024-02-15 DIAGNOSIS — Z124 Encounter for screening for malignant neoplasm of cervix: Secondary | ICD-10-CM

## 2024-02-15 DIAGNOSIS — L4050 Arthropathic psoriasis, unspecified: Secondary | ICD-10-CM | POA: Insufficient documentation

## 2024-02-15 DIAGNOSIS — Z01419 Encounter for gynecological examination (general) (routine) without abnormal findings: Secondary | ICD-10-CM

## 2024-02-15 DIAGNOSIS — M1990 Unspecified osteoarthritis, unspecified site: Secondary | ICD-10-CM | POA: Insufficient documentation

## 2024-02-15 NOTE — Progress Notes (Signed)
 Subjective:     Elaine [name] is a 49 y.o. female and is here for a comprehensive physical exam. The patient reports problems - still having cycles. Recently diagnosed with psoriatic arthritis. On meds and steroids.   The following portions of the patient's history were reviewed and updated as appropriate: allergies, current medications, past family history, past medical history, past social history, past surgical history, and problem list.  Review of Systems Pertinent items noted in HPI and remainder of comprehensive ROS otherwise negative.   Objective:  Chaperone present for exam   BP 132/86   Pulse 70   Wt 215 lb (97.5 kg)   BMI 29.99 kg/m  General appearance: alert, cooperative, and appears stated age Head: Normocephalic, without obvious abnormality, atraumatic Neck: no adenopathy, supple, symmetrical, trachea midline, thyroid : solitary nodule left inferior lobe, and thyroid  not enlarged, symmetric, no tenderness/mass/nodules Lungs: clear to auscultation bilaterally Breasts: normal appearance, no masses or tenderness inverted nipple right Heart: regular rate and rhythm, S1, S2 normal, no murmur, click, rub or gallop Abdomen: soft, non-tender; bowel sounds normal; no masses,  no organomegaly Pelvic: cervix normal in appearance, external genitalia normal, no adnexal masses or tenderness, no cervical motion tenderness, uterus normal size, shape, and consistency, and vagina normal without discharge Extremities: extremities normal, atraumatic, no cyanosis or edema Pulses: 2+ and symmetric Skin: Skin color, texture, turgor normal. No rashes or lesions Lymph nodes: Cervical, supraclavicular, and axillary nodes normal. Neurologic: Grossly normal       02/05/2023    8:47 AM 11/07/2021   10:20 AM 05/30/2021   10:12 AM 01/21/2021    9:52 AM  GAD 7 : Generalized Anxiety Score  Nervous, Anxious, on Edge 0 0 0 0  Control/stop worrying 0 0 0 0  Worry too much - different things 0 0 0 0   Trouble relaxing 0 2 0 1  Restless 0 2 0 0  Easily annoyed or irritable 0 0 0 0  Afraid - awful might happen 0 0 0 0  Total GAD 7 Score 0 4 0 1    Flowsheet Row Office Visit from 02/05/2023 in [facility] [employer]  PHQ-9 Total Score 4    Assessment:    Healthy female exam.      Plan:  Screening for malignant neoplasm of cervix - Pap smear today - Plan: Cytology - PAP( [facility])  Encounter for gynecological examination without abnormal finding - Mammogram 06/2023. s/p flu shot. Has PCP. PHQ9 and GAD 7 done. - Plan: Cytology - PAP( )    See After Visit Summary for Counseling Recommendations

## 2024-02-16 DIAGNOSIS — M1990 Unspecified osteoarthritis, unspecified site: Secondary | ICD-10-CM | POA: Diagnosis not present

## 2024-02-16 DIAGNOSIS — E669 Obesity, unspecified: Secondary | ICD-10-CM | POA: Diagnosis not present

## 2024-02-16 DIAGNOSIS — E230 Hypopituitarism: Secondary | ICD-10-CM | POA: Diagnosis not present

## 2024-02-16 DIAGNOSIS — E049 Nontoxic goiter, unspecified: Secondary | ICD-10-CM | POA: Diagnosis not present

## 2024-02-16 LAB — CYTOLOGY - PAP
Adequacy: ABSENT
Comment: NEGATIVE
Diagnosis: NEGATIVE
High risk HPV: NEGATIVE

## 2024-02-17 ENCOUNTER — Ambulatory Visit: Payer: Self-pay | Admitting: Family Medicine

## 2024-02-23 ENCOUNTER — Encounter: Payer: Self-pay | Admitting: Adult Health

## 2024-02-23 ENCOUNTER — Encounter (HOSPITAL_BASED_OUTPATIENT_CLINIC_OR_DEPARTMENT_OTHER): Payer: Self-pay | Admitting: Nurse Practitioner

## 2024-02-23 ENCOUNTER — Ambulatory Visit (HOSPITAL_BASED_OUTPATIENT_CLINIC_OR_DEPARTMENT_OTHER): Admitting: Nurse Practitioner

## 2024-02-23 VITALS — BP 118/72 | HR 77 | Ht 70.5 in | Wt 214.6 lb

## 2024-02-23 DIAGNOSIS — I951 Orthostatic hypotension: Secondary | ICD-10-CM | POA: Diagnosis not present

## 2024-02-23 DIAGNOSIS — R000 Tachycardia, unspecified: Secondary | ICD-10-CM

## 2024-02-23 DIAGNOSIS — E8801 Alpha-1-antitrypsin deficiency: Secondary | ICD-10-CM | POA: Diagnosis not present

## 2024-02-23 DIAGNOSIS — R0602 Shortness of breath: Secondary | ICD-10-CM

## 2024-02-23 DIAGNOSIS — L4050 Arthropathic psoriasis, unspecified: Secondary | ICD-10-CM

## 2024-02-23 DIAGNOSIS — Z7189 Other specified counseling: Secondary | ICD-10-CM

## 2024-02-23 DIAGNOSIS — E785 Hyperlipidemia, unspecified: Secondary | ICD-10-CM | POA: Diagnosis not present

## 2024-02-23 DIAGNOSIS — R001 Bradycardia, unspecified: Secondary | ICD-10-CM

## 2024-02-23 NOTE — Patient Instructions (Signed)
 Medication Instructions:   Your physician recommends that you continue on your current medications as directed. Please refer to the Current Medication list given to you today.   *If you need a refill on your cardiac medications before your next appointment, please call your pharmacy*  Lab Work:  Your physician recommends that you return for a FASTING NMR, fasting after midnight. Tomorrow.   If you have labs (blood work) drawn today and your tests are completely normal, you will receive your results only by: MyChart Message (if you have MyChart) OR A paper copy in the mail If you have any lab test that is abnormal or we need to change your treatment, we will call you to review the results.  Testing/Procedures:  None ordered.  Follow-Up: At [facility], you and your health needs are our priority.  As part of our continuing mission to provide you with exceptional heart care, our providers are all part of one team.  This team includes your primary Cardiologist (physician) and Advanced Practice Providers or APPs (Physician Assistants and Nurse Practitioners) who all work together to provide you with the care you need, when you need it.  Your next appointment:   1 year(s)  Provider:   Rosaline Bane, NP    We recommend signing up for the patient portal called MyChart.  Sign up information is provided on this After Visit Summary.  MyChart is used to connect with patients for Virtual Visits (Telemedicine).  Patients are able to view lab/test results, encounter notes, upcoming appointments, etc.  Non-urgent messages can be sent to your provider as well.   To learn more about what you can do with MyChart, go to [email or web address].   Other Instructions  Your physician wants you to follow-up in: 1 year. You will receive a reminder letter in the mail two months in advance. If you don't receive a letter, please call our office to schedule the follow-up appointment.  Patient  was given a work note today.

## 2024-02-23 NOTE — Progress Notes (Signed)
 . Cardiology Office Note:  .   Date:  02/23/2024  ID:  Elaine [name], DOB 22-Jun-1975, MRN [id number] PCP: Gretta Comer POUR, NP  [facility] Providers Cardiologist:  Aleene Passe, MD (Inactive) Cardiology APP:  Percy Rosaline HERO, NP    Patient Profile: .      PMH Familial hyperlipidemia Family history premature CAD Pectus excavatum Septic diverticulitis s/p colectomy Alpha 1 antitrypsin deficiency Chronic shortness of breath Coronary calcium  score of 0 on 04/05/2018  Referred to cardiology and seen by Dr. Passe 04/05/2018. History of alpha 1 antitrypsin deficiency followed by pulmonology with chronic shortness of breath.  Coronary calcium  score of 0 on 04/05/2018.  At office visit November 2022 she reported recent syncopal episode at 3 AM.  Woke up to use the bathroom, took perhaps 5 steps then woke up on the floor.  Her Apple Watch showed HR dropped into the 40s.  History of orthostatic hypotension.  Hydrates well plus electrolytes at least 1 daily.  Cardiac monitor 02/2021 revealed 1 episode of NSVT lasting 6 beats, predominant rhythm NSR with average HR 84 bpm.  She had a stress fracture in her right hip secondary to the syncopal episode.  She underwent uterine ablation which was complicated by infection and bleeding.  Decreased lung capacity felt to be secondary to pectus excavated him and scoliosis.  Seen on 08/27/2022 at which time she reported frequent tension rupture and hypomobile joints. She did not complete workup for Ehlers-Danlos syndrome due to only 1 provider in the area, but PT told her she was pretty certain she had it. Continued to have difficulty walking up inclines since hip fracture, is able to do 2 to 3% max grade on treadmill.  Lifts weights 3 days/week and does Pilates frequently. Lightheadedness when changing positions too quickly.  Gets short of breath initially with exercise which she reports is chronic and stable. SOB improved on Breo inhaler. Previously  worked as a secondary school teacher and follows a healthy diet with lots of fiber and protein.  Seen in follow-up by me on 02/10/23 at which time she was doing well on Repatha , with only minor side effects of a rash for about an hour after injection. Completed her 4th injection on that day. Adhering to a healthy diet, with a heavy protein shake in the morning and regular meals for lunch and dinner. Struggling with inflammation, which she attributes to Alpha 1 antitripsin deficiency. No obvious edema. Has started working at [employer] and routinely burns 600-700 calories daily. Has recently changed her health insurance, which will save her $700 a month and reduce her deductible. No episodes of hypotension.  No concerning cardiac symptoms. NMR lipoprofile revealed LDL particle number <300, small LDL-P 117, LDL-C 29, HDL-C 58, triglycerides 107.       History of Present Illness: .   Elaine [name] is a very pleasant 48 y.o. female who is here today for follow-up. She reports  Discussed the use of AI scribe software for clinical note transcription with the patient, who gave verbal consent to proceed.  History of Present Illness Elaine [name] is a very pleasant 48 year old female who is here today for follow-up of hyperlipidemia. She experiences alternating tachycardia and bradycardia episodes occurring at various times.  On one recent occasion HR dropped to 43 bpm after doing a cart run which is a very strenuous activity.  She experienced dizziness and lightheadedness which recovered after sitting for about 3 minutes.  She  has noticed episodes seem to cluster around her menstrual cycle. Her resting heart rate, which had been in the 130s, is now generally in the 90-110 bpm range since starting a job that involves standing all day. She has no change in shortness of breath. She has ankle swelling, left worse than right, which persists despite support socks. She denies knee pain but has significant ankle  symptoms which she has associated with features of Ehlers-Danlos syndrome.  Recently diagnosed with psoriatic arthritis treated with methylprednisolone, and Cosentyx. Attempts to taper methylprednisolone have led to recurrence of symptoms, so she remains on steroids, which have improved her mobility and allowed her to avoid using a cane after work. She reports features of Ehlers-Danlos syndrome and has alpha-1 antitrypsin deficiency. Her white blood cell count is over 16,000 despite being on immunotherapy.  BP has been well-controlled. She reports increased stress due to possible job loss for her husband. She recently obtained more affordable, comprehensive health insurance through her employer, [employer], where she works on her feet in a supportive environment.  She denies chest pain, palpitations, orthopnea, PND, syncope.  Discussed the use of AI scribe software for clinical note transcription with the patient, who gave verbal consent to proceed.   ROS: See HPI       Studies Reviewed: SABRA   EKG Interpretation Date/Time:  Tuesday February 23 2024 10:32:06 EST Ventricular Rate:  77 PR Interval:  142 QRS Duration:  68 QT Interval:  368 QTC Calculation: 416 R Axis:   33  Text Interpretation: Normal sinus rhythm Nonspecific ST abnormality No previous ECGs available Confirmed by Percy Browning [phone]) on 02/23/2024 11:12:55 AM     Risk Assessment/Calculations:             Physical Exam:   VS:  BP 118/72   Pulse 77   Ht 5' 10.5 (1.791 m)   Wt 214 lb 9.6 oz (97.3 kg)   SpO2 97%   BMI 30.36 kg/m    Wt Readings from Last 3 Encounters:  02/23/24 214 lb 9.6 oz (97.3 kg)  02/15/24 215 lb (97.5 kg)  12/31/23 213 lb (96.6 kg)    GEN: Well nourished, well developed in no acute distress NECK: No JVD; No carotid bruits CARDIAC: RRR, no murmurs, rubs, gallops RESPIRATORY:  Clear to auscultation without rales, wheezing or rhonchi  ABDOMEN: Soft, non-tender, non-distended EXTREMITIES:  No  edema; No deformity     ASSESSMENT AND PLAN: .    Hyperlipidemia LDL goal < 70 Cardiac risk CT calcium  score of 0 on January 2020.  LDL previously as high as 821 despite regular activity and healthy diet. Likely familial hyperlipidemia.  She is on Repatha  140 mg every 14 days and rosuvastatin  10 mg daily with no adverse side effects.  She had significant reduction in lipids with LDL particle number < 300, LDL-C 29, HDL-C 58, triglycerides 107, total cholesterol 107, and small LDL-P 117.  She continues to eat a healthy diet, but physical activity is somewhat limited by chronic inflammation from psoriatic arthritis and possible EDS.  She is very active at work often burning 500 to 600 cal per shift. - Continue rosuvastatin  and Repatha  - Continue regular physical activity as tolerated  - Heart healthy diet avoiding saturated fat, simple carbohydrates, sugar, and processed foods.  Tachycardia Bradycardia She continues to experience episodes of bradycardia with HR in the 40s during work following exertional activity.  HR is generally 90s to 110s, improved from previous HR consistently in the 130s  with activity.  She felt lightheaded at the time HR was in the 40s, but no syncope.  Advised her to continue to monitor and notify us  if episodes occur more often or symptoms worsen. - Consider repeat ZIO monitor if episodes increase or she becomes more symptomatic  Chronic shortness of breath Alpha 1 antitripsin defiency Feels that shortness of breath is stable. No chest pain, orthopnea, or PND. No acute concerns today.  - Needs new pulmonologist and would like to see provider at [facility]   - Referral placed  Inflammatory/Autoimmune disease Lower extremity edema Recently diagnosed with psoriatic arthritis and has had symptoms of EDS for years, although never formally diagnosed.  She feels these conditions are possibly related to alpha-1 antitrypsin deficiency.  Feels significant improvement in symptoms on  methylprednisone but recognizes concerns about long-term effects.  Has bilateral LE edema left worse than right.  2+ pulses bilaterally, color and temperature are normal.  Advised likely secondary to inflammation. She is seeking a second opinion and would like to referral to [facility] rheumatology. Continue heart healthy diet and regular activity as tolerated. - Referral placed to [facility] rheumatology - Management per rheumatology team  Orthostatic hypotension BP has been well controlled with no signs of orthostatic hypotension.  She is not on antihypertensive therapy. -Continue regular activity and careful position changes -Continue good hydration and nutrition       Disposition: 1 year with me  Signed, Rosaline Bane, NP-C

## 2024-02-24 ENCOUNTER — Other Ambulatory Visit: Payer: Self-pay | Admitting: *Deleted

## 2024-02-24 DIAGNOSIS — I951 Orthostatic hypotension: Secondary | ICD-10-CM | POA: Diagnosis not present

## 2024-02-24 DIAGNOSIS — R519 Headache, unspecified: Secondary | ICD-10-CM

## 2024-02-24 DIAGNOSIS — R0602 Shortness of breath: Secondary | ICD-10-CM | POA: Diagnosis not present

## 2024-02-24 DIAGNOSIS — Z79899 Other long term (current) drug therapy: Secondary | ICD-10-CM | POA: Diagnosis not present

## 2024-02-24 DIAGNOSIS — G43109 Migraine with aura, not intractable, without status migrainosus: Secondary | ICD-10-CM

## 2024-02-24 MED ORDER — AJOVY 225 MG/1.5ML ~~LOC~~ SOAJ: 225.0000 mg | SUBCUTANEOUS | 3 refills | Status: DC

## 2024-02-24 NOTE — Telephone Encounter (Signed)
 Ajovy  Rx changed from 1 month supply to 90 day supply at [facility].

## 2024-02-25 LAB — NMR, LIPOPROFILE
Cholesterol, Total: 110 mg/dL (ref 100–199)
HDL Particle Number: 43.3 umol/L (ref >=30.5)
HDL-C: 65 mg/dL (ref >39)
LDL Particle Number: <300 nmol/L (ref <1000)
LDL Size: 19.7 nm — ABNORMAL LOW (ref >20.5)
LDL-C (NIH Calc): 27 mg/dL (ref 0–99)
LP-IR Score: 39 (ref <=45)
Small LDL Particle Number: 182 nmol/L (ref <=527)
Triglycerides: 95 mg/dL (ref 0–149)

## 2024-02-26 ENCOUNTER — Ambulatory Visit (HOSPITAL_BASED_OUTPATIENT_CLINIC_OR_DEPARTMENT_OTHER): Payer: Self-pay | Admitting: Nurse Practitioner

## 2024-02-29 ENCOUNTER — Encounter (HOSPITAL_BASED_OUTPATIENT_CLINIC_OR_DEPARTMENT_OTHER): Payer: Self-pay

## 2024-03-04 ENCOUNTER — Other Ambulatory Visit (HOSPITAL_COMMUNITY): Payer: Self-pay

## 2024-03-07 ENCOUNTER — Other Ambulatory Visit (HOSPITAL_COMMUNITY): Payer: Self-pay

## 2024-03-09 ENCOUNTER — Telehealth (HOSPITAL_BASED_OUTPATIENT_CLINIC_OR_DEPARTMENT_OTHER): Payer: Self-pay | Admitting: *Deleted

## 2024-03-09 NOTE — Telephone Encounter (Signed)
 Faxed to [facility] fax [phone] phone # [phone] Referral Form, lab, notes and demographics.

## 2024-03-11 ENCOUNTER — Other Ambulatory Visit: Payer: Self-pay | Admitting: Adult Health

## 2024-03-11 DIAGNOSIS — R519 Headache, unspecified: Secondary | ICD-10-CM

## 2024-03-11 DIAGNOSIS — G43109 Migraine with aura, not intractable, without status migrainosus: Secondary | ICD-10-CM

## 2024-03-15 ENCOUNTER — Other Ambulatory Visit (HOSPITAL_COMMUNITY): Payer: Self-pay

## 2024-03-15 ENCOUNTER — Telehealth: Payer: Self-pay

## 2024-03-15 NOTE — Telephone Encounter (Signed)
 Pharmacy Patient Advocate Encounter   Received notification from [employer] that prior authorization for Ajovy  is required/requested.   Insurance verification completed.   The patient is insured through [facility] 2017.   Per test claim: PA required; PA submitted to above mentioned insurance via Latent Key/confirmation #/EOC [id number] Status is pending

## 2024-03-18 ENCOUNTER — Encounter (HOSPITAL_BASED_OUTPATIENT_CLINIC_OR_DEPARTMENT_OTHER): Payer: Self-pay

## 2024-03-18 NOTE — Telephone Encounter (Signed)
 Left on your desk to be filled out when you return to clinic.   Olanda Downie S Celia Gibbons, NP

## 2024-03-18 NOTE — Telephone Encounter (Signed)
 SABRA

## 2024-03-28 ENCOUNTER — Encounter: Payer: Self-pay | Admitting: Adult Health

## 2024-04-04 ENCOUNTER — Ambulatory Visit (HOSPITAL_BASED_OUTPATIENT_CLINIC_OR_DEPARTMENT_OTHER): Admitting: Nurse Practitioner

## 2024-04-04 ENCOUNTER — Ambulatory Visit: Admitting: Adult Health

## 2024-04-06 ENCOUNTER — Other Ambulatory Visit (HOSPITAL_COMMUNITY): Payer: Self-pay

## 2024-04-06 MED ORDER — MEDROL 2 MG PO TABS
ORAL_TABLET | ORAL | 0 refills | Status: DC
  Filled 2024-04-06: qty 45, 30d supply, fill #0

## 2024-04-07 ENCOUNTER — Telehealth (HOSPITAL_COMMUNITY): Payer: Self-pay

## 2024-04-07 ENCOUNTER — Telehealth (HOSPITAL_BASED_OUTPATIENT_CLINIC_OR_DEPARTMENT_OTHER): Payer: Self-pay | Admitting: *Deleted

## 2024-04-07 ENCOUNTER — Encounter: Payer: Self-pay | Admitting: Primary Care

## 2024-04-07 ENCOUNTER — Ambulatory Visit (INDEPENDENT_AMBULATORY_CARE_PROVIDER_SITE_OTHER): Admitting: Primary Care

## 2024-04-07 ENCOUNTER — Other Ambulatory Visit (HOSPITAL_COMMUNITY): Payer: Self-pay

## 2024-04-07 ENCOUNTER — Encounter (HOSPITAL_BASED_OUTPATIENT_CLINIC_OR_DEPARTMENT_OTHER): Payer: Self-pay

## 2024-04-07 ENCOUNTER — Other Ambulatory Visit: Payer: Self-pay

## 2024-04-07 ENCOUNTER — Encounter (HOSPITAL_COMMUNITY): Payer: Self-pay

## 2024-04-07 VITALS — BP 118/84 | HR 76 | Temp 97.7°F | Ht 70.0 in | Wt 216.4 lb

## 2024-04-07 DIAGNOSIS — R519 Headache, unspecified: Secondary | ICD-10-CM

## 2024-04-07 DIAGNOSIS — E66811 Obesity, class 1: Secondary | ICD-10-CM | POA: Insufficient documentation

## 2024-04-07 DIAGNOSIS — E6609 Other obesity due to excess calories: Secondary | ICD-10-CM

## 2024-04-07 DIAGNOSIS — E8801 Alpha-1-antitrypsin deficiency: Secondary | ICD-10-CM | POA: Diagnosis not present

## 2024-04-07 DIAGNOSIS — L4050 Arthropathic psoriasis, unspecified: Secondary | ICD-10-CM | POA: Diagnosis not present

## 2024-04-07 DIAGNOSIS — G43109 Migraine with aura, not intractable, without status migrainosus: Secondary | ICD-10-CM

## 2024-04-07 DIAGNOSIS — K5909 Other constipation: Secondary | ICD-10-CM | POA: Diagnosis not present

## 2024-04-07 DIAGNOSIS — E782 Mixed hyperlipidemia: Secondary | ICD-10-CM | POA: Diagnosis not present

## 2024-04-07 DIAGNOSIS — Z0001 Encounter for general adult medical examination with abnormal findings: Secondary | ICD-10-CM | POA: Insufficient documentation

## 2024-04-07 DIAGNOSIS — Z6831 Body mass index (BMI) 31.0-31.9, adult: Secondary | ICD-10-CM

## 2024-04-07 MED ORDER — NURTEC 75 MG PO TBDP: 75.0000 mg | ORAL_TABLET | ORAL | 11 refills | Status: DC | PRN

## 2024-04-07 MED ORDER — WEGOVY 0.25 MG/0.5ML ~~LOC~~ SOAJ
0.2500 mg | SUBCUTANEOUS | 0 refills | Status: DC
  Filled 2024-04-07: qty 2, 28d supply, fill #0

## 2024-04-07 MED ORDER — METHYLPREDNISOLONE 4 MG PO TABS
ORAL_TABLET | ORAL | 0 refills | Status: DC
  Filled 2024-04-07: qty 23, 30d supply, fill #1
  Filled 2024-04-07: qty 23, 30d supply, fill #0

## 2024-04-07 NOTE — Assessment & Plan Note (Addendum)
 Improved.  Continue Ajovy  150 mg every 4 weeks.  Continue Nurtec 75 mg daily PRN. Continue Baclofen  10 mg BID PRN. Continue Zomig  5 mg PRN  Reviewed neurology notes from October 2025.

## 2024-04-07 NOTE — Assessment & Plan Note (Signed)
 Following with pulmonology, recently referred to new pulmonology.

## 2024-04-07 NOTE — Progress Notes (Signed)
 "  Subjective:    Patient ID: Elaine [name], female    DOB: [birth year], 49 y.o.   MRN: [id number]  DORINDA STEHR is a very pleasant 49 y.o. female who presents today for complete physical and follow up of chronic conditions.  She would also like to discuss obesity. Chronic over the last few years. Diagnosed with psoriatic arthritis 1 year ago and has been on steroids since. She has difficulty with food cravings, constant eating, food noise. She's dealt with cravings and excessive eating for years, even prior to initiation of steroids.  She endorses a healthy diet. She cannot exercise due to RA, was told by rheumatology not to.  She is frustrated by her lack of weight loss over the years despite her efforts.  She is interested in starting GLP-1 agonist treatment.  Immunizations: -Tetanus: Completed in 2024 -Influenza: Completed this season   Diet: healthy diet Exercise: No regular exercise. Remains very active at work.   Eye exam: Completes annually  Dental exam: Completes semi-annually    Pap Smear: Completed in 2025 Mammogram: Completed in April 2025  Colonoscopy: Completed in 2024, due 2027  BP Readings from Last 3 Encounters:  04/07/24 118/84  02/23/24 118/72  02/15/24 132/86       Review of Systems  Constitutional:  Negative for unexpected weight change.  HENT:  Negative for rhinorrhea.   Respiratory:  Negative for cough and shortness of breath.   Cardiovascular:  Negative for chest pain.  Gastrointestinal:  Negative for constipation and diarrhea.  Genitourinary:  Negative for difficulty urinating.  Musculoskeletal:  Positive for arthralgias.  Skin:  Negative for rash.  Allergic/Immunologic: Negative for environmental allergies.  Neurological:  Negative for dizziness and headaches.  Psychiatric/Behavioral:  The patient is not nervous/anxious.          Past Medical History:  Diagnosis Date   Abnormal uterine bleeding 12/27/2020   ADHD (attention deficit  hyperactivity disorder)    Alpha-1-antitrypsin deficiency (HCC)    Anxiety    Arthritis    Asthma    Bulging lumbar disc    Diverticulitis    with sepsis   Diverticulosis    Endometritis 05/19/2021   Family history of adverse reaction to anesthesia    malignant Hyperthermia- father and sister   Fibroids    Vaginal   GERD (gastroesophageal reflux disease)    Giantism (HCC)    No compelte work up, but suspected   Headache    Heart murmur     Muscial  murmer -   History of hiatal hernia    Hyperlipidemia    Malignant hyperthermia    Mass of neck 06/01/2017   Orthostatic hypotension    Psoriasis    Psoriatic arthritis (HCC)    Right foot pain 11/01/2018   Ruptured ovarian cyst 2004,2016   Seizures (HCC)    none since age 65   Thyroid  disease    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     Social History   Socioeconomic History   Marital status: Married    Spouse name: Not on file   Number of children: Not on file   Years of education: Not on file   Highest education level: 12th grade  Occupational History   Not on file  Tobacco Use   Smoking status: Former    Current packs/day: 0.25    Average packs/day: 0.3 packs/day for 32.0 years (8.0 ttl pk-yrs)    Types: Cigarettes    Start date: 42   Smokeless tobacco: Never  Vaping Use   Vaping status: Never Used  Substance and Sexual Activity   Alcohol use: Yes    Comment: ocassional- 1-2 drinks   Drug use: Never   Sexual activity: Yes  Other Topics Concern   Not on file  Social History Narrative   Married.   1 child.   Works as a Visual Merchandiser.    Social Drivers of Health   Tobacco Use: Medium Risk (04/07/2024)   Patient History    Smoking Tobacco Use: Former    Smokeless Tobacco Use: Never    Passive Exposure: Not on file  Financial Resource Strain: Low Risk (09/24/2022)   Overall Financial Resource Strain (CARDIA)    Difficulty of Paying Living Expenses: Not hard at all  Food Insecurity: No Food Insecurity (02/05/2023)   Hunger  Vital Sign    Worried About Running Out of Food in the Last Year: Never true    Ran Out of Food in the Last Year: Never true  Transportation Needs: No Transportation Needs (02/05/2023)   PRAPARE - Administrator, Civil Service (Medical): No    Lack of Transportation (Non-Medical): No  Physical Activity: Sufficiently Active (09/24/2022)   Exercise Vital Sign    Days of Exercise per Week: 4 days    Minutes of Exercise per Session: 50 min  Stress: No Stress Concern Present (09/24/2022)   [employer] of Occupational Health - Occupational Stress Questionnaire    Feeling of Stress : Only a little  Social Connections: Moderately Isolated (09/24/2022)   Social Connection and Isolation Panel    Frequency of Communication with Friends and Family: More than three times a week    Frequency of Social Gatherings with Friends and Family: More than three times a week    Attends Religious Services: Never    Database Administrator or Organizations: No    Attends Engineer, Structural: Not on file    Marital Status: Married  Intimate Partner Violence: Not on file  Depression (PHQ2-9): Medium Risk (04/07/2024)   Depression (PHQ2-9)    PHQ-2 Score: 9  Alcohol Screen: Low Risk (09/24/2022)   Alcohol Screen    Last Alcohol Screening Score (AUDIT): 3  Housing: Low Risk (09/24/2022)   Housing    Last Housing Risk Score: 0  Utilities: Not on file  Health Literacy: Not on file    Past Surgical History:  Procedure Laterality Date   BREAST BIOPSY Right 07/04/2022   US  RT BREAST BX W LOC DEV 1ST LESION IMG BX SPEC US  GUIDE 07/04/2022 [facility]   COLONOSCOPY     COLONOSCOPY WITH PROPOFOL  N/A 11/25/2022   Procedure: COLONOSCOPY WITH PROPOFOL ;  Surgeon: Stacia Glendia BRAVO, MD;  Location: [facility];  Service: Gastroenterology;  Laterality: N/A;   DENTAL SURGERY     DILITATION & CURRETTAGE/HYSTROSCOPY WITH HYDROTHERMAL ABLATION N/A 04/16/2021   Procedure: DILATATION &  CURETTAGE/HYSTEROSCOPY WITH HYDROTHERMAL ABLATION;  Surgeon: Fredirick Glenys RAMAN, MD;  Location: [facility] OR;  Service: Gynecology;  Laterality: N/A;  rep will be here confirmed on 04/08/21 CS   LAPAROSCOPIC SIGMOID COLECTOMY  2013   POLYPECTOMY  11/25/2022   Procedure: POLYPECTOMY;  Surgeon: Stacia Glendia BRAVO, MD;  Location: [facility];  Service: Gastroenterology;;    Family History  Problem Relation Age of Onset   Stroke Father 82   Heart attack Father 73   Hyperlipidemia Father    Prostate cancer Father    Colon polyps Paternal Grandmother    Stomach cancer Neg Hx  Esophageal cancer Neg Hx    Pancreatic cancer Neg Hx    Colon cancer Neg Hx     Allergies[1]  Medications Ordered Prior to Encounter[2]  BP 118/84   Pulse 76   Temp 97.7 F (36.5 C) (Oral)   Ht 5' 10 (1.778 m)   Wt 216 lb 6 oz (98.1 kg)   SpO2 97%   BMI 31.05 kg/m  Objective:   Physical Exam HENT:     Right Ear: Tympanic membrane and ear canal normal.     Left Ear: Tympanic membrane and ear canal normal.  Eyes:     Pupils: Pupils are equal, round, and reactive to light.  Cardiovascular:     Rate and Rhythm: Normal rate and regular rhythm.  Pulmonary:     Effort: Pulmonary effort is normal.     Breath sounds: Normal breath sounds.  Abdominal:     General: Bowel sounds are normal.     Palpations: Abdomen is soft.     Tenderness: There is no abdominal tenderness.  Musculoskeletal:        General: Normal range of motion.     Cervical back: Neck supple.  Skin:    General: Skin is warm and dry.  Neurological:     Mental Status: She is alert and oriented to person, place, and time.     Cranial Nerves: No cranial nerve deficit.     Deep Tendon Reflexes:     Reflex Scores:      Patellar reflexes are 2+ on the right side and 2+ on the left side. Psychiatric:        Mood and Affect: Mood normal.     Physical Exam        Assessment & Plan:  Encounter for annual general medical examination with  abnormal findings in adult Assessment & Plan: Immunizations UTD. Pap smear UTD. Mammogram UTD Colonoscopy UTD, due 2027  Discussed the importance of a healthy diet and regular exercise in order for weight loss, and to reduce the risk of further co-morbidity.  Exam stable. Labs reviewed  Follow up in 1 year for repeat physical.    Migraine with aura and without status migrainosus, not intractable Assessment & Plan: Improved.  Continue Ajovy  150 mg every 4 weeks.  Continue Nurtec 75 mg daily PRN. Continue Baclofen  10 mg BID PRN. Continue Zomig  5 mg PRN  Reviewed neurology notes from October 2025.   Alpha-1-antitrypsin deficiency [facility]) Assessment & Plan: Following with pulmonology, recently referred to new pulmonology.      Chronic constipation Assessment & Plan: Controlled.  Continue high fiber diet, fiber supplement.    Psoriatic arthritis [facility]) Assessment & Plan: Following with rheumatology. She is currently working to get in with [facility].  Continue to wean off 2 mg of methylprednisolone  as instructed.    Mixed hyperlipidemia Assessment & Plan: Reviewed lipid panel from November 2025.  Will start Wegovy  as requested.    Class 1 obesity due to excess calories without serious comorbidity with body mass index (BMI) of 31.0 to 31.9 in adult Assessment & Plan: She is certainly a candidate given her medical history.  We discussed Wegovy  including potential side effects and administration directions.  Start semaglutide  (Wegovy ) for weight loss. Start by injecting 0.25 mg into the skin once weekly for 4 weeks, then increase to 0.5 mg once weekly thereafter.    Follow-up in 2 to 3 months.  Orders: -     Wegovy ; Inject 0.25 mg into the skin once a  week.  Dispense: 2 mL; Refill: 0    Assessment and Plan Assessment & Plan         Comer MARLA Gaskins, NP       [1]  Allergies Allergen Reactions   Other Nausea And Vomiting    Opioids    Cephalexin Other (See Comments)    Blisters in mouth    Zetia  [Ezetimibe ]     Shoulder pain   Penicillins Rash    Hasn't had since childhood Itching with Zosyn  05/2021  [2]  Current Outpatient Medications on File Prior to Visit  Medication Sig Dispense Refill   baclofen  (LIORESAL ) 10 MG tablet Take 1 tablet (10 mg total) by mouth 3 (three) times daily as needed for muscle spasms. 90 each 11   betamethasone dipropionate 0.05 % lotion 1 liberally Topical Daily     CETIRIZINE HCL PO Take 10 mg by mouth daily.     Cholecalciferol (VITAMIN D3 PO) Take 1 capsule by mouth daily.     COSENTYX SENSOREADY, 300 MG, 150 MG/ML SOAJ      Evolocumab  (REPATHA  SURECLICK) 140 MG/ML SOAJ Inject 140 mg into the skin every 14 (fourteen) days. 2 mL 11   fluconazole  (DIFLUCAN ) 150 MG tablet Take 1 tablet (150 mg total) by mouth daily. Repeat in 24 hours if needed 2 tablet 2   Fremanezumab -vfrm (AJOVY ) 225 MG/1.5ML SOAJ INJECT 225 MG INTO THE SKIN EVERY 30 (THIRTY) DAYS. 4.5 mL 1   MAGNESIUM PO Take 1 capsule by mouth daily.     Melatonin 10 MG TABS Take by mouth.     methylPREDNISolone  (MEDROL ) 2 MG tablet Take 1 & 1/2 tablets (3 mg total) by mouth daily for 30 days, THEN 1 tablet (2 mg total) daily for 30 days, THEN 1/2 tablet (1 mg total) daily. 90 tablet 0   rosuvastatin  (CRESTOR ) 10 MG tablet Take 1 tablet (10 mg total) by mouth daily. 90 tablet 3   zolmitriptan  (ZOMIG ) 5 MG tablet Take 1 tablet (5 mg total) by mouth as needed for migraine. 10 tablet 11   No current facility-administered medications on file prior to visit.   "

## 2024-04-07 NOTE — Assessment & Plan Note (Signed)
 Controlled.  Continue high fiber diet, fiber supplement.

## 2024-04-07 NOTE — Telephone Encounter (Signed)
 PA request has been Received. New Encounter has been or will be created for follow up. For additional info see Pharmacy Prior Auth telephone encounter from 04/07/24.

## 2024-04-07 NOTE — Assessment & Plan Note (Signed)
 She is certainly a candidate given her medical history.  We discussed Wegovy  including potential side effects and administration directions.  Start semaglutide  (Wegovy ) for weight loss. Start by injecting 0.25 mg into the skin once weekly for 4 weeks, then increase to 0.5 mg once weekly thereafter.    Follow-up in 2 to 3 months.

## 2024-04-07 NOTE — Telephone Encounter (Signed)
 Pt was referred to [facility] per Rosaline Percy PIETY.  Paperwork was filled out 12/10 and faxed X 2 [phone] and phone number [phone],  I called office and s/w one of the nurses, Gwen. Gwen could not pull up pt in the system and gave me number for referral department @ [phone].  Will try and call to see what is going on. Will report back to pt when the office receives an update.

## 2024-04-07 NOTE — Patient Instructions (Signed)
 Start semaglutide  (Wegovy ) for weight loss. Start by injecting 0.25 mg into the skin once weekly for 4 weeks, then increase to 0.5 mg once weekly thereafter.  Please notify me once you have used your last 0.25 mg pen so that I can send the 0.5 mg dose to your pharmacy.  Schedule a follow-up appointment in 2 to 3 months once you start the Wegovy .  It was a pleasure to see you today!

## 2024-04-07 NOTE — Assessment & Plan Note (Signed)
Immunizations UTD. Pap smear UTD. Mammogram UTD Colonoscopy UTD, due 2027  Discussed the importance of a healthy diet and regular exercise in order for weight loss, and to reduce the risk of further co-morbidity.  Exam stable. Labs reviewed.  Follow up in 1 year for repeat physical.

## 2024-04-07 NOTE — Assessment & Plan Note (Addendum)
 Reviewed lipid panel from November 2025.  Will start Wegovy  as requested.

## 2024-04-07 NOTE — Telephone Encounter (Signed)
 Pharmacy Patient Advocate Encounter   Received notification from Pt Calls Messages that prior authorization for Wegovy  0.25 mg/0.5 ml auto injectors is required/requested.   Insurance verification completed.   The patient is insured through [facility].   Per test claim: Per test claim, medication is not covered due to plan/benefit exclusion, PA not submitted at this time    *weight loss drugs are excluded from coverage

## 2024-04-07 NOTE — Assessment & Plan Note (Addendum)
 Following with rheumatology. She is currently working to get in with [facility].  Continue to wean off 2 mg of methylprednisolone  as instructed.

## 2024-04-08 ENCOUNTER — Other Ambulatory Visit (HOSPITAL_COMMUNITY): Payer: Self-pay

## 2024-04-08 NOTE — Telephone Encounter (Signed)
 Servidio, can you take a look at her most recent comment regarding [employer]? Does this make a difference?

## 2024-04-08 NOTE — Telephone Encounter (Signed)
 Patient notified.

## 2024-04-13 ENCOUNTER — Encounter: Payer: Self-pay | Admitting: Adult Health

## 2024-04-13 ENCOUNTER — Ambulatory Visit: Admitting: Adult Health

## 2024-04-13 ENCOUNTER — Other Ambulatory Visit: Payer: Self-pay | Admitting: Adult Health

## 2024-04-13 VITALS — BP 136/82 | HR 84 | Ht 70.0 in | Wt 217.8 lb

## 2024-04-13 DIAGNOSIS — R519 Headache, unspecified: Secondary | ICD-10-CM

## 2024-04-13 DIAGNOSIS — G43109 Migraine with aura, not intractable, without status migrainosus: Secondary | ICD-10-CM

## 2024-04-13 DIAGNOSIS — G5622 Lesion of ulnar nerve, left upper limb: Secondary | ICD-10-CM

## 2024-04-13 MED ORDER — AJOVY 225 MG/1.5ML ~~LOC~~ SOAJ: 225.0000 mg | SUBCUTANEOUS | 3 refills | Status: DC

## 2024-04-13 NOTE — Progress Notes (Signed)
 "  Referring:  Gretta Comer POUR, NP [address] [place],  [place] [address]  PCP: Gretta Comer POUR, NP    CC:  headaches   History provided from self  Follow-up visit:  Prior visit: 12/31/2022  Brief HPI:   Elaine [name] is a 49 y.o. female with PMH of HLD, fibroids and alpha-1 antitrypsin deficiency who is being followed for migraine headaches with aura which began around age 85. MRI brain unremarkable, MRI c-spine mild disc bulging at C5-6 and C6-7 without nerve compression  At prior visit, started Ajovy  for prevention and continued Zomig  or Nurtec as needed for rescue and started baclofen  as needed for tension headaches     Interval history:  Returns for follow up visit.  Notes overall improvement of migraine headaches on Ajovy .  She is currently experiencing about 4-6 migraines per month, can fluctuate based on weather and stress.  Use of Zomig  or Nurtec with benefit.  Notes great benefit with baclofen  for tension type headaches, typically takes twice during the day, unable to take prior to bedtime as it can cause insomnia.  She is currently satisfied with her treatment plan.  She also mentions left hand ring and pinky finger intermittent numbness which has been present since an elbow injury while skiing 2 years ago but has gradually worsened.  Denies any weakness. She has not tried any type of braces.       Migraine days per month: 4-6  Current Treatment: Abortive Nurtec Zomig  Baclofen   Preventative Ajovy   Prior Therapies                                 Topamax  100 mg QHS -paresthesias and cognitive impairment Lexapro - weight gain Amitriptyline - weight gain Emgality  Ajovy  Aimovig contraindicated due to constipation and GI issues   Zomig  Imitrex - palpitations Relpax - drowsiness Nurtec Baclofen      LABS: CBC    Component Value Date/Time   WBC 11.7 (H) 10/21/2022 1117   WBC 14.5 (H) 09/24/2022 1116   RBC 4.48 10/21/2022 1117   HGB  14.3 10/21/2022 1117   HGB 14.5 02/28/2021 1030   HCT 41.2 10/21/2022 1117   HCT 43.0 02/28/2021 1030   PLT 317 10/21/2022 1117   PLT 368 02/28/2021 1030   MCV 92.0 10/21/2022 1117   MCV 90 02/28/2021 1030   MCH 31.9 10/21/2022 1117   MCHC 34.7 10/21/2022 1117   RDW 12.9 10/21/2022 1117   RDW 12.9 02/28/2021 1030   LYMPHSABS 2.7 10/21/2022 1117   MONOABS 0.6 10/21/2022 1117   EOSABS 0.1 10/21/2022 1117   BASOSABS 0.0 10/21/2022 1117      Latest Ref Rng & Units 11/28/2022   10:00 AM 10/21/2022   11:17 AM 09/24/2022   11:16 AM  CMP  Glucose 70 - 99 mg/dL  99  94   BUN 6 - 20 mg/dL  10  20   Creatinine 9.55 - 1.00 mg/dL  9.30  9.19   Sodium 864 - 145 mmol/L  140  137   Potassium 3.5 - 5.1 mmol/L  3.9  4.9   Chloride 98 - 111 mmol/L  107  102   CO2 22 - 32 mmol/L  27  27   Calcium  8.9 - 10.3 mg/dL  9.3  89.8   Total Protein 6.5 - 8.1 g/dL  6.4  7.0   Total Bilirubin 0.3 - 1.2 mg/dL  0.5  0.7   Alkaline Phos 38 - 126 U/L  64  68   AST 15 - 41 U/L  13  17   ALT 0 - 32 IU/L 13  13  16      IMAGING:  none   Current Outpatient Medications on File Prior to Visit  Medication Sig Dispense Refill   baclofen  (LIORESAL ) 10 MG tablet Take 1 tablet (10 mg total) by mouth 3 (three) times daily as needed for muscle spasms. 90 each 11   betamethasone dipropionate 0.05 % lotion 1 liberally Topical Daily     CETIRIZINE HCL PO Take 10 mg by mouth daily.     Cholecalciferol (VITAMIN D3 PO) Take 1 capsule by mouth daily.     COSENTYX SENSOREADY, 300 MG, 150 MG/ML SOAJ      Evolocumab  (REPATHA  SURECLICK) 140 MG/ML SOAJ Inject 140 mg into the skin every 14 (fourteen) days. 2 mL 11   fluconazole  (DIFLUCAN ) 150 MG tablet Take 1 tablet (150 mg total) by mouth daily. Repeat in 24 hours if needed 2 tablet 2   MAGNESIUM PO Take 1 capsule by mouth daily.     Melatonin 10 MG TABS Take by mouth.     methylPREDNISolone  (MEDROL ) 4 MG tablet Take 1 tablet every other day alternating with 1/2 tablet every  other day for 1 month, then take 1/2 tablet daily until appointment in April. 53 tablet 0   Rimegepant Sulfate (NURTEC) 75 MG TBDP Take 1 tablet (75 mg total) by mouth as needed. 16 tablet 11   rosuvastatin  (CRESTOR ) 10 MG tablet Take 1 tablet (10 mg total) by mouth daily. 90 tablet 3   semaglutide -weight management (WEGOVY ) 0.25 MG/0.5ML SOAJ SQ injection Inject 0.25 mg into the skin once a week. 2 mL 0   zolmitriptan  (ZOMIG ) 5 MG tablet Take 1 tablet (5 mg total) by mouth as needed for migraine. 10 tablet 11   No current facility-administered medications on file prior to visit.     Allergies: Allergies  Allergen Reactions   Other Nausea And Vomiting    Opioids   Cephalexin Other (See Comments)    Blisters in mouth    Zetia  [Ezetimibe ]     Shoulder pain   Penicillins Rash    Hasn't had since childhood Itching with Zosyn  05/2021    Family History: Migraine or other headaches in the family:  grandmother and daughter have migraines Aneurysms in a first degree relative:  no Brain tumors in the family:  no Other neurological illness in the family:   no  Past Medical History: Past Medical History:  Diagnosis Date   Abnormal uterine bleeding 12/27/2020   ADHD (attention deficit hyperactivity disorder)    Alpha-1-antitrypsin deficiency (HCC)    Anxiety    Arthritis    Asthma    Bulging lumbar disc    Diverticulitis    with sepsis   Diverticulosis    Endometritis 05/19/2021   Family history of adverse reaction to anesthesia    malignant Hyperthermia- father and sister   Fibroids    Vaginal   GERD (gastroesophageal reflux disease)    Giantism (HCC)    No compelte work up, but suspected   Headache    Heart murmur     Muscial  murmer -   History of hiatal hernia    Hyperlipidemia    Malignant hyperthermia    Mass of neck 06/01/2017   Orthostatic hypotension    Psoriasis    Psoriatic arthritis (HCC)    Right  foot pain 11/01/2018   Ruptured ovarian cyst 2004,2016    Seizures (HCC)    none since age 73   Thyroid  disease    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     Past Surgical History Past Surgical History:  Procedure Laterality Date   BREAST BIOPSY Right 07/04/2022   US  RT BREAST BX W LOC DEV 1ST LESION IMG BX SPEC US  GUIDE 07/04/2022 [facility]   COLONOSCOPY     COLONOSCOPY WITH PROPOFOL  N/A 11/25/2022   Procedure: COLONOSCOPY WITH PROPOFOL ;  Surgeon: Stacia Glendia BRAVO, MD;  Location: [facility];  Service: Gastroenterology;  Laterality: N/A;   DENTAL SURGERY     DILITATION & CURRETTAGE/HYSTROSCOPY WITH HYDROTHERMAL ABLATION N/A 04/16/2021   Procedure: DILATATION & CURETTAGE/HYSTEROSCOPY WITH HYDROTHERMAL ABLATION;  Surgeon: Fredirick Glenys RAMAN, MD;  Location: [facility] OR;  Service: Gynecology;  Laterality: N/A;  rep will be here confirmed on 04/08/21 CS   LAPAROSCOPIC SIGMOID COLECTOMY  2013   POLYPECTOMY  11/25/2022   Procedure: POLYPECTOMY;  Surgeon: Stacia Glendia BRAVO, MD;  Location: [facility];  Service: Gastroenterology;;    Social History: Social History   Tobacco Use   Smoking status: Former    Current packs/day: 0.25    Average packs/day: 0.3 packs/day for 32.0 years (8.0 ttl pk-yrs)    Types: Cigarettes    Start date: 1994   Smokeless tobacco: Never  Vaping Use   Vaping status: Never Used  Substance Use Topics   Alcohol use: Yes    Comment: ocassional- 1-2 drinks   Drug use: Never    ROS: Negative for fevers, chills. Positive for headaches. All other systems reviewed and negative unless stated otherwise in HPI.   Physical Exam:   Vital Signs: BP 136/82   Pulse 84   Ht 5' 10 (1.778 m)   Wt 217 lb 12.8 oz (98.8 kg)   BMI 31.25 kg/m  GENERAL: well appearing,in no acute distress,alert  NEUROLOGICAL: Mental Status: Alert, oriented to person, place and time,Follows commands Cranial Nerves: PERRL, visual fields intact to confrontation, extraocular movements intact, no facial droop or ptosis, hearing grossly intact, no dysarthria, palate  elevate symmetrically, tongue protrudes midline, shoulder shrug intact and symmetric Motor: muscle strength 5/5 both upper and lower extremities,no drift, normal tone Reflexes: 2+ bilateral upper extremities, brisk bilateral patellar reflexes without spread Coordination: Finger-to- nose-finger intact bilaterally Gait: Antalgic     IMPRESSION: 49 year old female with a history of HLD, fibroids, alpha 1 antitrypsin deficiency who presents for follow-up of migraines.  MRI brain normal.  MRI cervical showed mild disc bulging at C5-6 and C6-7 without any nerve compression.  Migraines greatly improved on monthly Ajovy  injection and tension type headaches improved with use of baclofen .  Use of Zomig  or Nurtec as needed with benefit. 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     PLAN: -Prevention: Continue Ajovy  monthly injection -Rescue: Continue Zomig  or Nurtec 75 mg as needed, continue baclofen  10mg  TID PRN - discussed pursuing EMG/NCV for further evaluation of left hand numbness.  She declines interest at this current time.  Discussed conservative measures at home to reduce symptoms - Continue to follow with rheumatology for psoriatic arthritis - Continue to follow with orthopedics as scheduled    Follow-up in 6 months or call earlier if needed     Harlene Bogaert, [facility]  [facility] [address] [place], [place] [address]  Phone [phone] Fax [phone] Note: This document was prepared with  digital dictation and possible smart lobbyist. Any transcriptional errors that result from this process are unintentional.   "

## 2024-04-13 NOTE — Patient Instructions (Addendum)
 Your Plan:  Continue current treatment regimen  Please call with any worsening or persistent migraines       Follow up in 6 months or call earlier if needed      Thank you for coming to see us  at [facility]. I hope we have been able to provide you high quality care today.  You may receive a patient satisfaction survey over the next few weeks. We would appreciate your feedback and comments so that we may continue to improve ourselves and the health of our patients.

## 2024-04-14 DIAGNOSIS — M249 Joint derangement, unspecified: Secondary | ICD-10-CM | POA: Insufficient documentation

## 2024-04-14 DIAGNOSIS — Z7952 Long term (current) use of systemic steroids: Secondary | ICD-10-CM | POA: Insufficient documentation

## 2024-04-14 DIAGNOSIS — N959 Unspecified menopausal and perimenopausal disorder: Secondary | ICD-10-CM | POA: Insufficient documentation

## 2024-04-14 NOTE — Telephone Encounter (Signed)
 Called the patient and spoke with her, patient stated that the pharmacist told her she'd need a new prior authorization for three months versus the one month. Patient is trying to get prescription filled for three months at a time due to cost. Is this something we can start now?

## 2024-04-14 NOTE — Progress Notes (Signed)
 "        I, Ileana Collet, PhD, LAT, ATC acting as a scribe for Artist Lloyd, MD.  Elaine [name] is a 49 y.o. female who presents to [facility] today for evaluation of her hypermobility. Hx of psoriatic arthritis. She notes over the past few years, her hypermobility has lessened.  Pt also suffered a fall on Saturday, tripping over her dog. She was seen at [facility] ED and dx w/ a L radial head fx. Pt also notes pain in her L wrist/hand.  She works at [employer]  MS: Chronic joint pain, Chronic wide spread muscle pain, Joint Hypermobility, Joint Instability, and Abnormal spinal curvature Skin/Immune reactions: Fragile Skin that tears easily, Soft Velvety Skin, Poor Wound Healing, Easy Bruising / Bleeding, Atrophic Scars, Abnormal Stretch Marks Before Puberty / without significant weight gain , MCAS, Rashes / Hives, and Medication / Chemical / Food Sensitivities , psoriasis, Neurological: Headache  Migraine, Syncope / Pre-Syncope, Reduced Proprioception, Clumsiness , Burning, Numbness and/or Tingling in Extremities, CCI, Chiari Malformation, Cognitive Dysfunction / Brain Fog, and Sleep Disturbance ANS: Dysautonomia / POTS / Orthostatic Intolerance, Syncope / Pre-Syncope / Dizziness, Fatigue, Exercise / Temperature Intolerance , and Anxiety / Panic Respiratory: Dyspnea and Vocal Chord Dysfunction CV: Tachycardia and Varicose Veins GI:  GERD and IBS with Diarrhea or Constipation Genitourinary: Pelvic Pain / Fullness / Pressure and Prolapse Hands & Feet: Long Slender Fingers / Toes  Treatments tried: pelvic PT, pilates, PT, home exercises  Pertinent review of systems: No fevers or chills  Relevant historical information: Migraine headache orthostatic hypotension alpha 1 antitrypsin deficiency and psoriatic arthritis.   Exam:  BP 128/84   Pulse 88   Ht 5' 10 (1.778 m)   Wt 220 lb (99.8 kg)   SpO2 98%   BMI 31.57 kg/m  General: Well Developed, well nourished,  and in no acute distress.   MSK: Left elbow swollen tender decreased range of motion. Left wrist mild swelling.  Pain-free range of motion with flexion and extension but painful with pronation and supination.  Pulses cap refill and sensation are intact distally.    Lab and Radiology Results No results found for this or any previous visit (from the past 72 hours). DG Knee Complete 4 Views Right Result Date: 04/16/2024 EXAM: 4 VIEW(S) XRAY OF THE RIGHT KNEE 04/16/2024 09:33:49 PM COMPARISON: None available. CLINICAL HISTORY: Tripped over doll with right knee pain, initial encounter. FINDINGS: BONES AND JOINTS: No acute fracture. No malalignment. No significant joint effusion. SOFT TISSUES: Unremarkable. IMPRESSION: 1. No acute fracture or dislocation. Electronically signed by: Oneil Devonshire MD 04/16/2024 09:41 PM EST RP Workstation: [id number]   DG Elbow Complete Left Result Date: 04/16/2024 EXAM: 3 VIEW(S) XRAY OF THE LEFT ELBOW COMPARISON: None available. CLINICAL HISTORY: tripped over dog. FINDINGS: BONES AND JOINTS: Acute nondisplaced radial neck fracture. Small elbow effusion. No malalignment. SOFT TISSUES: Unremarkable. IMPRESSION: 1. Acute nondisplaced radial neck fracture. 2. Small elbow effusion. Electronically signed by: Oneil Devonshire MD 04/16/2024 09:41 PM EST RP Workstation: [id number]   DG Forearm Left Result Date: 04/16/2024 EXAM: VIEW(S) XRAY OF THE LEFT FOREARM 04/16/2024 09:33:49 PM COMPARISON: None available. CLINICAL HISTORY: tripped over dog with left forearm pain, initial encounter FINDINGS: BONES AND JOINTS: Mild cortical step-off is noted at the junction of the radial neck and radial head, suspicious for an undisplaced fracture. No other forearm abnormality is noted. No malalignment. No definitive joint effusion is seen on this exam. SOFT TISSUES: Unremarkable.  IMPRESSION: 1. Mild cortical step-off at the junction of the radial neck and radial head, suspicious for a nondisplaced  fracture. 2. No other forearm abnormality. Electronically signed by: Oneil Devonshire MD 04/16/2024 09:40 PM EST RP Workstation: [id number]   DG Hand Complete Left Result Date: 04/16/2024 EXAM: 3 OR MORE VIEW(S) XRAY OF THE LEFT HAND 04/16/2024 09:33:49 PM COMPARISON: None available. CLINICAL HISTORY: Tripped over dog with left hand pain, initial encounter. FINDINGS: BONES AND JOINTS: No acute fracture. No malalignment. SOFT TISSUES: Unremarkable. IMPRESSION: 1. No acute fracture or dislocation. Electronically signed by: Oneil Devonshire MD 04/16/2024 09:39 PM EST RP Workstation: [id number] Artist Joane, personally (independently) visualized and performed the interpretation of the images attached in this note.   Patient was fitted with a custom made fiberglass long-arm posterior arm splint across the elbow and wrist left arm.   Assessment and Plan: 49 y.o. female with left radial head fracture occurring in the setting of hypermobility and psoriatic arthritis.  Plan for posterior arm fiberglass splint today and continued sling.  Recheck in 1 week.  Patient is hypermobile and likely will meet criteria for hypermobile EDS but that cannot fully be evaluated today given her elbow fracture.  Anticipate evaluating EDS in the future.    Additionally today we discussed work status.  She works for [employer] so there probably is a role for her at work only using her right arm.  Anticipate returning to work on the 23rd with light duty for 1 month.  Work form completed today will modify if needed.   PDMP not reviewed this encounter. No orders of the defined types were placed in this encounter.  No orders of the defined types were placed in this encounter.    Discussed warning signs or symptoms. Please see discharge instructions. Patient expresses understanding.   The above documentation has been reviewed and is accurate and complete Artist Joane, M.D.   "

## 2024-04-14 NOTE — Telephone Encounter (Signed)
 Called and spoke to the pharmacy they verified patient has a one month supply ready. Pharmacy stated they would be unable to attempt to fill for three months at this time, and that the computer would reject it and say it was too early to fill.

## 2024-04-15 ENCOUNTER — Other Ambulatory Visit (HOSPITAL_COMMUNITY): Payer: Self-pay

## 2024-04-15 ENCOUNTER — Telehealth: Payer: Self-pay

## 2024-04-15 NOTE — Telephone Encounter (Signed)
 Pharmacy Patient Advocate Encounter   Received notification from RX Request Messages that prior authorization for AJOVY  (fremanezumab -vfrm) injection 225MG /1.5ML auto-injectors is required/requested.   Insurance verification completed.   The patient is insured through [facility] 2017.   Per test claim: PA required; PA submitted to above mentioned insurance via Latent Key/confirmation #/EOC [id number] Status is pending

## 2024-04-15 NOTE — Telephone Encounter (Signed)
 PA request has been Submitted. New Encounter has been or will be created for follow up. For additional info see Pharmacy Prior Auth telephone encounter from 04-15-2024.

## 2024-04-16 ENCOUNTER — Emergency Department (HOSPITAL_COMMUNITY)
Admission: EM | Admit: 2024-04-16 | Discharge: 2024-04-17 | Disposition: A | Attending: Emergency Medicine | Admitting: Emergency Medicine

## 2024-04-16 ENCOUNTER — Emergency Department (HOSPITAL_COMMUNITY)

## 2024-04-16 ENCOUNTER — Encounter (HOSPITAL_COMMUNITY): Payer: Self-pay

## 2024-04-16 ENCOUNTER — Other Ambulatory Visit: Payer: Self-pay

## 2024-04-16 DIAGNOSIS — J45909 Unspecified asthma, uncomplicated: Secondary | ICD-10-CM | POA: Insufficient documentation

## 2024-04-16 DIAGNOSIS — M25522 Pain in left elbow: Secondary | ICD-10-CM | POA: Insufficient documentation

## 2024-04-16 DIAGNOSIS — S52125A Nondisplaced fracture of head of left radius, initial encounter for closed fracture: Secondary | ICD-10-CM | POA: Insufficient documentation

## 2024-04-16 DIAGNOSIS — W541XXA Struck by dog, initial encounter: Secondary | ICD-10-CM | POA: Diagnosis not present

## 2024-04-16 DIAGNOSIS — M545 Low back pain, unspecified: Secondary | ICD-10-CM | POA: Diagnosis not present

## 2024-04-16 DIAGNOSIS — S8001XA Contusion of right knee, initial encounter: Secondary | ICD-10-CM | POA: Diagnosis not present

## 2024-04-16 DIAGNOSIS — S6992XA Unspecified injury of left wrist, hand and finger(s), initial encounter: Secondary | ICD-10-CM | POA: Diagnosis present

## 2024-04-16 NOTE — ED Triage Notes (Signed)
 Pt states that she tripped over her dog tonight and injured her L arm and R knee. No LOC. Did not hit head. No thinners.

## 2024-04-17 MED ORDER — IBUPROFEN 400 MG PO TABS
600.0000 mg | ORAL_TABLET | Freq: Once | ORAL | Status: AC
  Administered 2024-04-17: 600 mg via ORAL
  Filled 2024-04-17: qty 1

## 2024-04-17 MED ORDER — OXYCODONE HCL 5 MG PO TABS: 5.0000 mg | ORAL_TABLET | Freq: Four times a day (QID) | ORAL | 0 refills | Status: AC | PRN

## 2024-04-17 MED ORDER — ACETAMINOPHEN 500 MG PO TABS
1000.0000 mg | ORAL_TABLET | Freq: Once | ORAL | Status: AC
  Administered 2024-04-17: 1000 mg via ORAL
  Filled 2024-04-17: qty 2

## 2024-04-17 NOTE — ED Provider Notes (Signed)
 " [facility] Provider Note  CSN: [id number] Arrival date & time: 04/16/24 2035  Chief Complaint(s) Fall  HPI Elaine [name] is a 49 y.o. female history of alpha 1 antitrypsin deficiency, psoriatic arthritis presenting to the emergency department left arm pain.  Patient reports that she tripped over her dog.  She has pain primarily in the left elbow.  Also landed on the right knee and had a bruise to the knee.  Has been ambulatory.  No head injury, loss of consciousness.  Reports some chronic unchanged low back pain.  No neck pain.  No chest pain or abdominal pain.   Past Medical History Past Medical History:  Diagnosis Date   Abnormal uterine bleeding 12/27/2020   ADHD (attention deficit hyperactivity disorder)    Alpha-1-antitrypsin deficiency (HCC)    Anxiety    Arthritis    Asthma    Bulging lumbar disc    Diverticulitis    with sepsis   Diverticulosis    Endometritis 05/19/2021   Family history of adverse reaction to anesthesia    malignant Hyperthermia- father and sister   Fibroids    Vaginal   GERD (gastroesophageal reflux disease)    Giantism (HCC)    No compelte work up, but suspected   Headache    Heart murmur     Muscial  murmer -   History of hiatal hernia    Hyperlipidemia    Malignant hyperthermia    Mass of neck 06/01/2017   Orthostatic hypotension    Psoriasis    Psoriatic arthritis (HCC)    Right foot pain 11/01/2018   Ruptured ovarian cyst 2004,2016   Seizures (HCC)    none since age 25   Thyroid  disease    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    Patient Active Problem List   Diagnosis Date Noted   Long term systemic steroid user 04/14/2024   Menopausal and postmenopausal disorder 04/14/2024   Joint derangement 04/14/2024   Class 1 obesity due to excess calories without serious comorbidity with body mass index (BMI) of 31.0 to 31.9 in adult 04/07/2024   Encounter for annual general medical examination with abnormal  findings in adult 04/07/2024   Osteoarthritis 02/15/2024   Psoriatic arthritis (HCC) 02/15/2024   Benign neoplasm of colon 11/25/2022   Chronic constipation 09/03/2022   Chronic neck pain 09/03/2022   Orthostatic hypotension 01/30/2021   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   Ovarian cyst 12/27/2020   History of diverticulitis 10/15/2020   Chronic back pain 08/15/2020   Degeneration of lumbar intervertebral disc 04/18/2020   Difficulty concentrating 08/09/2019   Malignant hyperthermia 10/29/2018   Migraine with aura and without status migrainosus, not intractable 05/31/2018   Family history of cardiovascular disease 02/03/2018   Laryngopharyngeal reflux (LPR) 01/05/2018   Fatigue 12/02/2017   Chronic pain of right wrist 08/12/2017   Screening for colon cancer 12/24/2016   Hyperlipidemia 11/26/2016   Vitamin B 12 deficiency 11/26/2016   Vitamin D  deficiency 11/26/2016   Psoriasis 11/26/2016   Alpha-1-antitrypsin deficiency (HCC) 11/26/2016   GERD (gastroesophageal reflux disease) 11/26/2016   Home Medication(s) Prior to Admission medications  Medication Sig Start Date End Date Taking? Authorizing Provider  oxyCODONE  (ROXICODONE ) 5 MG immediate release tablet Take 1 tablet (5 mg total) by mouth every 6 (six) hours as needed for severe pain (pain score 7-10). 04/17/24  Yes Francesca Elsie LITTIE, MD  baclofen  (LIORESAL ) 10 MG tablet Take 1 tablet (10 mg total) by mouth  3 (three) times daily as needed for muscle spasms. 12/31/23   Whitfield Raisin, NP  betamethasone dipropionate 0.05 % lotion 1 liberally Topical Daily 05/28/21   [provider]  Cholecalciferol (VITAMIN D3 PO) Take 1 capsule by mouth daily.    [provider]  GORDAN PITTS, 300 MG, 150 MG/ML SOAJ  12/07/23   [provider]  Evolocumab  (REPATHA  SURECLICK) 140 MG/ML SOAJ Inject 140 mg into the skin every 14 (fourteen) days. 11/12/23   Okey Vina GAILS, MD  MAGNESIUM PO Take 1  capsule by mouth daily.    [provider]  Melatonin 10 MG TABS Take by mouth.    [provider]  methylPREDNISolone  (MEDROL ) 4 MG tablet Take 4 mg by mouth daily. 04/07/24   [provider]  rosuvastatin  (CRESTOR ) 10 MG tablet Take 1 tablet (10 mg total) by mouth daily. 08/03/23   Swinyer, Rosaline HERO, NP                                                                                                                                    Past Surgical History Past Surgical History:  Procedure Laterality Date   BREAST BIOPSY Right 07/04/2022   US  RT BREAST BX W LOC DEV 1ST LESION IMG BX SPEC US  GUIDE 07/04/2022 [facility]   COLONOSCOPY     COLONOSCOPY WITH PROPOFOL  N/A 11/25/2022   Procedure: COLONOSCOPY WITH PROPOFOL ;  Surgeon: Stacia Glendia BRAVO, MD;  Location: [facility];  Service: Gastroenterology;  Laterality: N/A;   DENTAL SURGERY     DILITATION & CURRETTAGE/HYSTROSCOPY WITH HYDROTHERMAL ABLATION N/A 04/16/2021   Procedure: DILATATION & CURETTAGE/HYSTEROSCOPY WITH HYDROTHERMAL ABLATION;  Surgeon: Fredirick Glenys RAMAN, MD;  Location: [facility] OR;  Service: Gynecology;  Laterality: N/A;  rep will be here confirmed on 04/08/21 CS   LAPAROSCOPIC SIGMOID COLECTOMY  2013   POLYPECTOMY  11/25/2022   Procedure: POLYPECTOMY;  Surgeon: Stacia Glendia BRAVO, MD;  Location: [facility];  Service: Gastroenterology;;   Family History Family History  Problem Relation Age of Onset   Stroke Father 74   Heart attack Father 62   Hyperlipidemia Father    Prostate cancer Father    Colon polyps Paternal Grandmother    Stomach cancer Neg Hx    Esophageal cancer Neg Hx    Pancreatic cancer Neg Hx    Colon cancer Neg Hx     Social History Social History[1] Allergies Other, Cephalexin, African mango [irvingia gabonensis], Zetia  [ezetimibe ], and Penicillins  Review of Systems Review of Systems  All other systems reviewed and are negative.   Physical Exam Vital Signs  I have  reviewed the triage vital signs BP (!) 148/86   Pulse 74   Temp (!) 97.5 F (36.4 C)   Resp 19   Ht 5' 10 (1.778 m)   Wt 98.8 kg   SpO2 100%   BMI 31.25 kg/m  Physical Exam Vitals and nursing note  reviewed.  Constitutional:      Appearance: Normal appearance.  HENT:     Head: Normocephalic and atraumatic.     Mouth/Throat:     Mouth: Mucous membranes are moist.  Eyes:     Conjunctiva/sclera: Conjunctivae normal.  Cardiovascular:     Rate and Rhythm: Normal rate.  Pulmonary:     Effort: Pulmonary effort is normal. No respiratory distress.  Abdominal:     General: Abdomen is flat.  Musculoskeletal:        General: No deformity.     Comments: No midline spinal tenderness.  Chest wall stable.  Right upper extremity, bilateral lower extremities atraumatic with full range of motion of all joints, only exception with small ecchymosis over the right knee without underlying tenderness.  Left upper extremity with no tenderness to the hand, wrist, shoulder, tenderness over the left elbow without deformity.  Distal neurovascular function intact in the left upper extremity  Skin:    General: Skin is warm and dry.     Capillary Refill: Capillary refill takes less than 2 seconds.  Neurological:     General: No focal deficit present.     Mental Status: She is alert. Mental status is at baseline.  Psychiatric:        Mood and Affect: Mood normal.        Behavior: Behavior normal.     ED Results and Treatments Labs (all labs ordered are listed, but only abnormal results are displayed) Labs Reviewed - No data to display                                                                                                                        Radiology DG Knee Complete 4 Views Right Result Date: 04/16/2024 EXAM: 4 VIEW(S) XRAY OF THE RIGHT KNEE 04/16/2024 09:33:49 PM COMPARISON: None available. CLINICAL HISTORY: Tripped over doll with right knee pain, initial encounter. FINDINGS: BONES AND  JOINTS: No acute fracture. No malalignment. No significant joint effusion. SOFT TISSUES: Unremarkable. IMPRESSION: 1. No acute fracture or dislocation. Electronically signed by: Oneil Devonshire MD 04/16/2024 09:41 PM EST RP Workstation: [id number]   DG Elbow Complete Left Result Date: 04/16/2024 EXAM: 3 VIEW(S) XRAY OF THE LEFT ELBOW COMPARISON: None available. CLINICAL HISTORY: tripped over dog. FINDINGS: BONES AND JOINTS: Acute nondisplaced radial neck fracture. Small elbow effusion. No malalignment. SOFT TISSUES: Unremarkable. IMPRESSION: 1. Acute nondisplaced radial neck fracture. 2. Small elbow effusion. Electronically signed by: Oneil Devonshire MD 04/16/2024 09:41 PM EST RP Workstation: [id number]   DG Forearm Left Result Date: 04/16/2024 EXAM: VIEW(S) XRAY OF THE LEFT FOREARM 04/16/2024 09:33:49 PM COMPARISON: None available. CLINICAL HISTORY: tripped over dog with left forearm pain, initial encounter FINDINGS: BONES AND JOINTS: Mild cortical step-off is noted at the junction of the radial neck and radial head, suspicious for an undisplaced fracture. No other forearm abnormality is noted. No malalignment. No definitive joint effusion is seen on this exam. SOFT TISSUES: Unremarkable. IMPRESSION: 1. Mild cortical step-off at  the junction of the radial neck and radial head, suspicious for a nondisplaced fracture. 2. No other forearm abnormality. Electronically signed by: Oneil Devonshire MD 04/16/2024 09:40 PM EST RP Workstation: [id number]   DG Hand Complete Left Result Date: 04/16/2024 EXAM: 3 OR MORE VIEW(S) XRAY OF THE LEFT HAND 04/16/2024 09:33:49 PM COMPARISON: None available. CLINICAL HISTORY: Tripped over dog with left hand pain, initial encounter. FINDINGS: BONES AND JOINTS: No acute fracture. No malalignment. SOFT TISSUES: Unremarkable. IMPRESSION: 1. No acute fracture or dislocation. Electronically signed by: Oneil Devonshire MD 04/16/2024 09:39 PM EST RP Workstation: [id number]    Pertinent labs & imaging  results that were available during my care of the patient were reviewed by me and considered in my medical decision making (see MDM for details).  Medications Ordered in ED Medications  acetaminophen  (TYLENOL ) tablet 1,000 mg (has no administration in time range)  ibuprofen  (ADVIL ) tablet 600 mg (has no administration in time range)                                                                                                                                     Procedures Procedures  (including critical care time)  Medical Decision Making / ED Course   MDM:  49 year old presenting with fall.  Patient reports some pain in the left elbow, right knee.  No head injury.  X-rays show a nondisplaced radial head fracture.  Patient placed in sling and instructed to follow-up with orthopedic surgery.  No other injuries are evident on examination and her distal neurovascular status is intact.  Patient is stable for discharge and outpatient follow-up. Will discharge patient to home. All questions answered. Patient comfortable with plan of discharge. Return precautions discussed with patient and specified on the after visit summary.       Additional history obtained: -Additional history obtained from spouse    Imaging Studies ordered: I ordered imaging studies including XR elbow On my interpretation imaging demonstrates radial head fracture  I independently visualized and interpreted imaging. I agree with the radiologist interpretation   Medicines ordered and prescription drug management: Meds ordered this encounter  Medications   acetaminophen  (TYLENOL ) tablet 1,000 mg   ibuprofen  (ADVIL ) tablet 600 mg   oxyCODONE  (ROXICODONE ) 5 MG immediate release tablet    Sig: Take 1 tablet (5 mg total) by mouth every 6 (six) hours as needed for severe pain (pain score 7-10).    Dispense:  10 tablet    Refill:  0    -I have reviewed the patients home medicines and have made adjustments as  needed   Social Determinants of Health:  Diagnosis or treatment significantly limited by social determinants of health: obesity   Reevaluation: After the interventions noted above, I reevaluated the patient and found that their symptoms have improved  Co morbidities that complicate the patient evaluation  Past Medical History:  Diagnosis Date   Abnormal uterine bleeding 12/27/2020  ADHD (attention deficit hyperactivity disorder)    Alpha-1-antitrypsin deficiency (HCC)    Anxiety    Arthritis    Asthma    Bulging lumbar disc    Diverticulitis    with sepsis   Diverticulosis    Endometritis 05/19/2021   Family history of adverse reaction to anesthesia    malignant Hyperthermia- father and sister   Fibroids    Vaginal   GERD (gastroesophageal reflux disease)    Giantism (HCC)    No compelte work up, but suspected   Headache    Heart murmur     Muscial  murmer -   History of hiatal hernia    Hyperlipidemia    Malignant hyperthermia    Mass of neck 06/01/2017   Orthostatic hypotension    Psoriasis    Psoriatic arthritis (HCC)    Right foot pain 11/01/2018   Ruptured ovarian cyst 2004,2016   Seizures (HCC)    none since age 25   Thyroid  disease    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       Dispostion: Disposition decision including need for hospitalization was considered, and patient discharged from emergency department.    Final Clinical Impression(s) / ED Diagnoses Final diagnoses:  Closed nondisplaced fracture of head of left radius, initial encounter     This chart was dictated using voice recognition software.  Despite best efforts to proofread,  errors can occur which can change the documentation meaning.     [1]  Social History Tobacco Use   Smoking status: Former    Current packs/day: 0.25    Average packs/day: 0.3 packs/day for 32.0 years (8.0 ttl pk-yrs)    Types: Cigarettes    Start date: 1994   Smokeless tobacco: Never  Vaping Use   Vaping status:  Never Used  Substance Use Topics   Alcohol use: Yes    Comment: ocassional- 1-2 drinks   Drug use: Never     Francesca Elsie CROME, MD 04/17/24 [phone]  "

## 2024-04-17 NOTE — Progress Notes (Signed)
 Orthopedic Tech Progress Note Patient Details:  Elaine [name] March 15, 1976 [id number]  Ortho Devices Type of Ortho Device: Arm sling Ortho Device/Splint Location: lue Ortho Device/Splint Interventions: Ordered, Application, Adjustment   Post Interventions Patient Tolerated: Well Instructions Provided: Care of device, Adjustment of device  Chandra Dorn PARAS 04/17/2024, 1:01 AM

## 2024-04-17 NOTE — Discharge Instructions (Addendum)
 We evaluated you for your fall. You have a fracture of your radial head. We have given you a sling. We don't anticipate you will need surgery. Please follow up with [facility]. Please also rest your arm, elevate your arm, and ice your elbow.   Please take Tylenol  (acetaminophen ) and Motrin  (ibuprofen ) for your symptoms at home.  You can take 1000 mg of Tylenol  every 6 hours and 600 mg of Motrin  every 6 hours as needed for your symptoms.  You can take these medicines together as needed, either at the same time, or alternating every 3 hours.  We have given you a small amount of oxycodone  which you can take for pain not relieved by Tylenol  and Motrin .  Do not drink alcohol, drive, or operate machinery when taking this medicine.  Please return if you have any new or worsening symptoms.

## 2024-04-18 ENCOUNTER — Ambulatory Visit: Admitting: Family Medicine

## 2024-04-18 VITALS — BP 128/84 | HR 88 | Ht 70.0 in | Wt 220.0 lb

## 2024-04-18 DIAGNOSIS — L4050 Arthropathic psoriasis, unspecified: Secondary | ICD-10-CM | POA: Diagnosis not present

## 2024-04-18 DIAGNOSIS — M357 Hypermobility syndrome: Secondary | ICD-10-CM | POA: Diagnosis not present

## 2024-04-18 DIAGNOSIS — S52125A Nondisplaced fracture of head of left radius, initial encounter for closed fracture: Secondary | ICD-10-CM

## 2024-04-18 NOTE — Patient Instructions (Addendum)
 Thank you for coming in today.   Check back in 1 week  Let me know if we need to revise work accommodations  Check out the book Disjointed Navigating the Diagnosis and Management of Hypermobile Ehlers-Danlos Syndrome and Hypermobility Spectrum Disorders.     For POTS symptoms: Consume 3 L water and 8-12 gram of salt per day    Check out these websites for exercises to try if you have POTS ([employer] Protocol, [place] Protocol, and [employer] Protocol) Https://www.taylor-robbins.com/ https://dean.info/    Guide to Prof. Consuelo Patient Self-Care Handouts https://webspace.Http://www.black-smith.org/   Orthostatic Intolerance and Postural Orthostatic Tachycardia Self-Care Checklist https://webspace.Wvlyxc.com   Steps To Managing Your Mast Cell Activation Syndrome https://webspace.Newyearsms.fi.pdf

## 2024-04-19 NOTE — Telephone Encounter (Signed)
 Pharmacy Patient Advocate Encounter  Received notification from [facility]   that Prior Authorization for AJOVY  (fremanezumab -vfrm) injection 225MG /1.5ML auto-injectors has been CANCELLED due to no prior authorization required. Medication is too soon to fill. Last filled 04-13-2024 for a 28 day supply.    PA #/Case ID/Reference #: [id number]

## 2024-04-21 ENCOUNTER — Telehealth: Payer: Self-pay | Admitting: Family Medicine

## 2024-04-21 NOTE — Telephone Encounter (Signed)
 Pt left after-hours voicemail, wants to confirm we recd her paperwork from [employer].

## 2024-04-22 ENCOUNTER — Other Ambulatory Visit (HOSPITAL_COMMUNITY): Payer: Self-pay

## 2024-04-22 NOTE — Telephone Encounter (Signed)
 Pt unable to return to work with light duty 04/22/24 (office note indicated RTW date 04/25/24 with light duty, employer unable to accommodate).   Will plan to re-eval on 04/28/24 with new anticipated RTW date.   RTW as of now 05/02/24. Will complete new forms after f/u visit.   Forms completed, reviewed and signed by Dr. Joane, placed at the front desk for faxing/scanning.

## 2024-04-25 ENCOUNTER — Telehealth: Payer: Self-pay | Admitting: Pharmacy Technician

## 2024-04-25 ENCOUNTER — Other Ambulatory Visit (HOSPITAL_COMMUNITY): Payer: Self-pay

## 2024-04-27 NOTE — Progress Notes (Unsigned)
"       ° °  LILLETTE Ileana Collet, PhD, LAT, ATC acting as a scribe for Artist Lloyd, MD.  Elaine [name] is a 49 y.o. female who presents to [facility] today for f/u L radial head fx and hypermobility. Pt was last seen by Dr. Ezzard yon 04/18/24 and she was placed in a posterior long-arm fiberglass splint. [employer] would not accommodate light duty, so she was written out of work.   Today, pt reports   Pertinent review of systems: ***  Relevant historical information: ***   Exam:  There were no vitals taken for this visit. General: Well Developed, well nourished, and in no acute distress.   MSK: ***    Lab and Radiology Results No results found for this or any previous visit (from the past 72 hours). No results found.     Assessment and Plan: 49 y.o. female with ***   PDMP not reviewed this encounter. No orders of the defined types were placed in this encounter.  No orders of the defined types were placed in this encounter.    Discussed warning signs or symptoms. Please see discharge instructions. Patient expresses understanding.   ***  "

## 2024-04-28 ENCOUNTER — Encounter: Payer: Self-pay | Admitting: Family Medicine

## 2024-04-28 ENCOUNTER — Ambulatory Visit: Admitting: Family Medicine

## 2024-04-28 ENCOUNTER — Ambulatory Visit

## 2024-04-28 VITALS — BP 142/86 | HR 82 | Ht 70.0 in

## 2024-04-28 DIAGNOSIS — S52125D Nondisplaced fracture of head of left radius, subsequent encounter for closed fracture with routine healing: Secondary | ICD-10-CM

## 2024-04-28 DIAGNOSIS — S52125A Nondisplaced fracture of head of left radius, initial encounter for closed fracture: Secondary | ICD-10-CM

## 2024-04-28 NOTE — Patient Instructions (Addendum)
 Thank you for coming in today.   See you back in 2 weeks.

## 2024-05-02 NOTE — Telephone Encounter (Signed)
 ERROR

## 2024-05-03 ENCOUNTER — Ambulatory Visit: Payer: Self-pay | Admitting: Family Medicine

## 2024-05-03 NOTE — Progress Notes (Signed)
 Left elbow x-ray shows stable alignment of the radial neck fracture with a little bit of healing.  The elbow is still somewhat swollen.

## 2024-05-05 ENCOUNTER — Other Ambulatory Visit (HOSPITAL_BASED_OUTPATIENT_CLINIC_OR_DEPARTMENT_OTHER): Payer: Self-pay

## 2024-05-05 MED ORDER — ZOLMITRIPTAN 5 MG PO TABS: 5.0000 mg | ORAL_TABLET | ORAL | 11 refills | Status: AC | PRN

## 2024-05-05 MED ORDER — HYDROCORTISONE 2.5 % EX OINT: TOPICAL_OINTMENT | CUTANEOUS | 3 refills | Status: AC

## 2024-05-05 MED ORDER — AJOVY 225 MG/1.5ML ~~LOC~~ SOAJ
225.0000 mg | SUBCUTANEOUS | 11 refills | Status: AC
  Filled 2024-05-06: qty 1.5, 30d supply, fill #0

## 2024-05-05 MED ORDER — METHYLPREDNISOLONE 4 MG PO TABS
4.0000 mg | ORAL_TABLET | Freq: Every day | ORAL | 2 refills | Status: AC
  Filled 2024-05-06: qty 30, 30d supply, fill #0

## 2024-05-05 MED ORDER — NURTEC 75 MG PO TBDP: 75.0000 mg | ORAL_TABLET | ORAL | 11 refills | Status: AC | PRN

## 2024-05-06 ENCOUNTER — Other Ambulatory Visit: Payer: Self-pay

## 2024-05-06 ENCOUNTER — Encounter: Payer: Self-pay | Admitting: Family Medicine

## 2024-05-06 ENCOUNTER — Encounter: Payer: Self-pay | Admitting: Adult Health

## 2024-05-06 ENCOUNTER — Encounter (HOSPITAL_BASED_OUTPATIENT_CLINIC_OR_DEPARTMENT_OTHER): Payer: Self-pay

## 2024-05-06 ENCOUNTER — Other Ambulatory Visit (HOSPITAL_BASED_OUTPATIENT_CLINIC_OR_DEPARTMENT_OTHER): Payer: Self-pay

## 2024-05-09 ENCOUNTER — Encounter (HOSPITAL_BASED_OUTPATIENT_CLINIC_OR_DEPARTMENT_OTHER): Admitting: Pulmonary Disease

## 2024-05-12 ENCOUNTER — Ambulatory Visit: Admitting: Family Medicine

## 2024-09-15 ENCOUNTER — Ambulatory Visit: Admitting: Adult Health

## 2024-10-17 ENCOUNTER — Ambulatory Visit: Admitting: Adult Health
